# Patient Record
Sex: Male | Born: 1965
Health system: Southern US, Community
[De-identification: ages and names within clinical notes are randomized; demographics above are authoritative.]

## PROBLEM LIST (undated history)

## (undated) DIAGNOSIS — I10 Essential (primary) hypertension: Secondary | ICD-10-CM

## (undated) DIAGNOSIS — E785 Hyperlipidemia, unspecified: Secondary | ICD-10-CM

## (undated) HISTORY — DX: Essential (primary) hypertension: I10

---

## 2003-05-10 ENCOUNTER — Emergency Department (HOSPITAL_COMMUNITY): Admission: AD | Admit: 2003-05-10 | Discharge: 2003-05-10 | Payer: Self-pay | Admitting: Family Medicine

## 2008-09-22 ENCOUNTER — Emergency Department (HOSPITAL_COMMUNITY): Admission: EM | Admit: 2008-09-22 | Discharge: 2008-09-22 | Payer: Self-pay | Admitting: Emergency Medicine

## 2010-10-16 LAB — WOUND CULTURE

## 2012-12-15 ENCOUNTER — Ambulatory Visit: Payer: No Typology Code available for payment source | Attending: Family Medicine | Admitting: Internal Medicine

## 2012-12-15 VITALS — BP 154/102 | HR 63 | Temp 98.0°F | Resp 15 | Ht <= 58 in | Wt 220.0 lb

## 2012-12-15 DIAGNOSIS — I10 Essential (primary) hypertension: Secondary | ICD-10-CM | POA: Insufficient documentation

## 2012-12-15 MED ORDER — METOPROLOL SUCCINATE ER 50 MG PO TB24: 50.0000 mg | ORAL_TABLET | Freq: Every day | ORAL | Status: DC

## 2012-12-15 MED ORDER — HYDROCHLOROTHIAZIDE 25 MG PO TABS: 25.0000 mg | ORAL_TABLET | Freq: Every day | ORAL | Status: DC

## 2012-12-15 MED ORDER — POTASSIUM CHLORIDE CRYS ER 20 MEQ PO TBCR: 20.0000 meq | EXTENDED_RELEASE_TABLET | Freq: Every day | ORAL | Status: DC

## 2012-12-15 MED ORDER — AMLODIPINE BESYLATE 10 MG PO TABS: 10.0000 mg | ORAL_TABLET | Freq: Every day | ORAL | Status: DC

## 2012-12-15 NOTE — Progress Notes (Signed)
Patient here to establish care Takes medication for blood pressure States takes a med called HTC ?

## 2012-12-15 NOTE — Progress Notes (Signed)
Patient ID: Damon Peck, male   DOB: 1966-05-08, 47 y.o.   MRN: [id number]  Patient Demographics  Damon Peck, is a 47 y.o. male  [id number]  [id number]  DOB - [birth year]  Chief Complaint  Patient presents with  . Establish Care        Subjective:   Damon Peck today has, No headache, No chest pain, No abdominal pain - No Nausea, No new weakness tingling or numbness, No Cough - SOB. History of hypertension and is here to get his medications refilled, no subjective complaints.  Objective:   No past medical history on file.    No past surgical history on file.   Filed Vitals:   12/15/12 1138  BP: 154/102  Pulse: 63  Temp: 98 F (36.7 C)  Resp: 15  SpO2: 100%     Exam  Awake Alert, Oriented X 3, No new F.N deficits, Normal affect [place].AT,PERRAL Supple Neck,No JVD, No cervical lymphadenopathy appriciated.  Symmetrical Chest wall movement, Good air movement bilaterally, CTAB RRR,No Gallops,Rubs or new Murmurs, No Parasternal Heave +ve B.Sounds, Abd Soft, Non tender, No organomegaly appriciated, No rebound - guarding or rigidity. No Cyanosis, Clubbing or edema, No new Rash or bruise       Data Review   CBC No results found for this basename: WBC, HGB, HCT, PLT, MCV, MCH, MCHC, RDW, NEUTRABS, LYMPHSABS, MONOABS, EOSABS, BASOSABS, BANDABS, BANDSABD,  in the last 168 hours  Chemistries   No results found for this basename: NA, K, CL, CO2, GLUCOSE, BUN, CREATININE, GFRCGP, CALCIUM, MG, AST, ALT, ALKPHOS, BILITOT,  in the last 168 hours ------------------------------------------------------------------------------------------------------------------ No results found for this basename: HGBA1C,  in the last 72 hours ------------------------------------------------------------------------------------------------------------------ No results found for this basename: CHOL, HDL, LDLCALC, TRIG, CHOLHDL, LDLDIRECT,  in the last 72  hours ------------------------------------------------------------------------------------------------------------------ No results found for this basename: TSH, T4TOTAL, FREET3, T3FREE, THYROIDAB,  in the last 72 hours ------------------------------------------------------------------------------------------------------------------ No results found for this basename: VITAMINB12, FOLATE, FERRITIN, TIBC, IRON, RETICCTPCT,  in the last 72 hours  Coagulation profile  No results found for this basename: INR, PROTIME,  in the last 168 hours     Prior to Admission medications   Medication Sig Start Date End Date Taking? Authorizing Provider  metoprolol succinate (TOPROL-XL) 50 MG 24 hr tablet Take 1 tablet (50 mg total) by mouth daily. Take with or immediately following a meal. 12/15/12  Yes Leroy Sea, MD  hydrochlorothiazide (HYDRODIURIL) 25 MG tablet Take 1 tablet (25 mg total) by mouth daily. 12/15/12   Leroy Sea, MD  potassium chloride SA (K-DUR,KLOR-CON) 20 MEQ tablet Take 1 tablet (20 mEq total) by mouth daily. 12/15/12   Leroy Sea, MD     Assessment & Plan   Hypertension. Stable his home medications were refilled as above, these are not new medications, and her Norvasc for better control, he will come back in a month to get BP and BMP checked.       Leroy Sea M.D on 12/15/2012 at 11:52 AM

## 2013-01-04 ENCOUNTER — Telehealth: Payer: Self-pay | Admitting: Family Medicine

## 2013-01-04 NOTE — Telephone Encounter (Signed)
[facility] calling for pt. Please call [facility] rep back.

## 2013-03-17 ENCOUNTER — Ambulatory Visit: Payer: No Typology Code available for payment source | Attending: Internal Medicine

## 2013-04-24 ENCOUNTER — Ambulatory Visit: Payer: No Typology Code available for payment source | Attending: Internal Medicine | Admitting: Internal Medicine

## 2013-04-24 ENCOUNTER — Encounter: Payer: Self-pay | Admitting: Internal Medicine

## 2013-04-24 VITALS — BP 159/92 | HR 62 | Temp 98.7°F | Resp 16 | Ht 69.0 in | Wt 240.0 lb

## 2013-04-24 DIAGNOSIS — I10 Essential (primary) hypertension: Secondary | ICD-10-CM | POA: Insufficient documentation

## 2013-04-24 DIAGNOSIS — Z79899 Other long term (current) drug therapy: Secondary | ICD-10-CM | POA: Insufficient documentation

## 2013-04-24 DIAGNOSIS — E785 Hyperlipidemia, unspecified: Secondary | ICD-10-CM | POA: Insufficient documentation

## 2013-04-24 DIAGNOSIS — Z760 Encounter for issue of repeat prescription: Secondary | ICD-10-CM | POA: Insufficient documentation

## 2013-04-24 MED ORDER — METOPROLOL SUCCINATE ER 50 MG PO TB24: 50.0000 mg | ORAL_TABLET | Freq: Every day | ORAL | Status: DC

## 2013-04-24 MED ORDER — POTASSIUM CHLORIDE CRYS ER 20 MEQ PO TBCR: 20.0000 meq | EXTENDED_RELEASE_TABLET | Freq: Every day | ORAL | Status: DC

## 2013-04-24 MED ORDER — HYDROCHLOROTHIAZIDE 25 MG PO TABS: 25.0000 mg | ORAL_TABLET | Freq: Every day | ORAL | Status: DC

## 2013-04-24 MED ORDER — AMLODIPINE BESYLATE 10 MG PO TABS: 10.0000 mg | ORAL_TABLET | Freq: Every day | ORAL | Status: DC

## 2013-04-24 NOTE — Progress Notes (Signed)
Pt is here for a f/u visit. Pt is here for a check up and  medication refill.

## 2013-04-24 NOTE — Progress Notes (Signed)
Patient ID: Damon Peck, male   DOB: 03-20-1966, 47 y.o.   MRN: [id number] Patient Demographics  Damon Peck, is a 47 y.o. male  [id number]  [id number]  DOB - Jan 03, 1966  Chief Complaint  Patient presents with  . Follow-up        Subjective:   Damon Peck is a 47 y.o. male here today for a follow up visit. He is here for refill of his medications. He has no significant complaint today. He claims to be compliant with medication, he thinks his blood pressure was high today because of stress, his wife was rushed to the emergency department this morning for shortness of breath/difficulty breathing. He does not smoke cigarette. Patient has No headache, No chest pain, No abdominal pain - No Nausea, No new weakness tingling or numbness, No Cough - SOB.  ALLERGIES: No Known Allergies  PAST MEDICAL HISTORY: Past Medical History  Diagnosis Date  . Hypertension     MEDICATIONS AT HOME: Prior to Admission medications   Medication Sig Start Date End Date Taking? Authorizing Provider  hydrochlorothiazide (HYDRODIURIL) 25 MG tablet Take 1 tablet (25 mg total) by mouth daily. 04/24/13  Yes Jeanann Lewandowsky, MD  metoprolol succinate (TOPROL-XL) 50 MG 24 hr tablet Take 1 tablet (50 mg total) by mouth daily. Take with or immediately following a meal. 04/24/13  Yes Jeanann Lewandowsky, MD  potassium chloride SA (K-DUR,KLOR-CON) 20 MEQ tablet Take 1 tablet (20 mEq total) by mouth daily. 04/24/13  Yes Jeanann Lewandowsky, MD  amLODipine (NORVASC) 10 MG tablet Take 1 tablet (10 mg total) by mouth daily. 04/24/13   Jeanann Lewandowsky, MD     Objective:   Filed Vitals:   04/24/13 1210  BP: 159/92  Pulse: 62  Temp: 98.7 F (37.1 C)  TempSrc: Oral  Resp: 16  Height: 5\' 9"  (1.753 m)  Weight: 240 lb (108.863 kg)  SpO2: 98%    Exam General appearance : Awake, alert, not in any distress. Speech Clear. Not toxic looking HEENT: Atraumatic and Normocephalic, pupils equally reactive to light and  accomodation Neck: supple, no JVD. No cervical lymphadenopathy.  Chest:Good air entry bilaterally, no added sounds  CVS: S1 S2 regular, no murmurs.  Abdomen: Bowel sounds present, Non tender and not distended with no gaurding, rigidity or rebound. Extremities: B/L Lower Ext shows no edema, both legs are warm to touch Neurology: Awake alert, and oriented X 3, CN II-XII intact, Non focal Skin:No Rash Wounds:N/A   Data Review   CBC No results found for this basename: WBC, HGB, HCT, PLT, MCV, MCH, MCHC, RDW, NEUTRABS, LYMPHSABS, MONOABS, EOSABS, BASOSABS, BANDABS, BANDSABD,  in the last 168 hours  Chemistries   No results found for this basename: NA, K, CL, CO2, GLUCOSE, BUN, CREATININE, GFRCGP, CALCIUM, MG, AST, ALT, ALKPHOS, BILITOT,  in the last 168 hours ------------------------------------------------------------------------------------------------------------------ No results found for this basename: HGBA1C,  in the last 72 hours ------------------------------------------------------------------------------------------------------------------ No results found for this basename: CHOL, HDL, LDLCALC, TRIG, CHOLHDL, LDLDIRECT,  in the last 72 hours ------------------------------------------------------------------------------------------------------------------ No results found for this basename: TSH, T4TOTAL, FREET3, T3FREE, THYROIDAB,  in the last 72 hours ------------------------------------------------------------------------------------------------------------------ No results found for this basename: VITAMINB12, FOLATE, FERRITIN, TIBC, IRON, RETICCTPCT,  in the last 72 hours  Coagulation profile  No results found for this basename: INR, PROTIME,  in the last 168 hours    Assessment & Plan   Patient Active Problem List   Diagnosis Date Noted  . Dyslipidemia 04/24/2013  . HTN (hypertension) 12/15/2012  Plan: Hydrochlorothiazide 25 mg tablet by mouth daily Metoprolol  XL 50 mg tablet by mouth daily Amlodipine 10 mg tablet by mouth daily Potassium chloride 20 mEq tablet by mouth daily  Labs today: Comprehensive metabolic panel Lipid panel  Will review results and decide on continuation of potassium or not We'll call patient with results when available  Patient has been counseled extensively about nutrition and exercise Resources for DASH diet given Patient encouraged to be compliant with medications, we have discussed hypertension and Blood pressure goal.  Health Maintenance -Vaccinations:  -Influenza decline the patient  Follow up in 3 months or when necessary   The patient was given clear instructions to go to ER or return to medical center if symptoms don't improve, worsen or new problems develop. The patient verbalized understanding. The patient was told to call to get lab results if they haven't heard anything in the next week.    Jeanann Lewandowsky, MD, MHA, FACP, FAAP [facility] [place], [place] [phone]   04/24/2013, 12:45 PM

## 2013-04-24 NOTE — Patient Instructions (Signed)

## 2013-04-25 LAB — CMP AND LIVER
ALT: 24 U/L (ref 0–53)
AST: 18 U/L (ref 0–37)
Albumin: 4.2 g/dL (ref 3.5–5.2)
Bilirubin, Direct: 0.1 mg/dL (ref 0.0–0.3)
CO2: 29 mEq/L (ref 19–32)
Calcium: 9.3 mg/dL (ref 8.4–10.5)
Chloride: 99 mEq/L (ref 96–112)
Potassium: 4.1 mEq/L (ref 3.5–5.3)
Total Protein: 7 g/dL (ref 6.0–8.3)

## 2013-04-25 LAB — LIPID PANEL
LDL Cholesterol: 109 mg/dL — ABNORMAL HIGH (ref 0–99)
VLDL: 29 mg/dL (ref 0–40)

## 2013-04-28 ENCOUNTER — Telehealth: Payer: Self-pay

## 2013-04-28 NOTE — Telephone Encounter (Signed)
Message copied by Damon Peck on Fri Apr 28, 2013  4:40 PM ------      Message from: Quentin Angst      Created: Fri Apr 28, 2013  4:33 PM       Please inform patient that his lab results are mostly normal. Recommend nutrition and regular exercise regimen ------

## 2013-04-28 NOTE — Telephone Encounter (Signed)
Patient is aware of his lab results 

## 2013-05-26 ENCOUNTER — Other Ambulatory Visit: Payer: Self-pay | Admitting: Emergency Medicine

## 2013-05-26 DIAGNOSIS — I10 Essential (primary) hypertension: Secondary | ICD-10-CM

## 2013-05-26 DIAGNOSIS — E785 Hyperlipidemia, unspecified: Secondary | ICD-10-CM

## 2013-07-10 ENCOUNTER — Telehealth: Payer: Self-pay | Admitting: Emergency Medicine

## 2013-07-10 NOTE — Telephone Encounter (Signed)
Left voicemail for pt to return call when message received. Upon looking in doctor notes, medication Metoprolol succinate XL 50 mg was never increased to 100 mg.

## 2013-07-25 ENCOUNTER — Ambulatory Visit: Payer: Self-pay | Admitting: Internal Medicine

## 2013-08-02 ENCOUNTER — Ambulatory Visit: Payer: No Typology Code available for payment source | Attending: Internal Medicine

## 2013-08-03 ENCOUNTER — Encounter: Payer: Self-pay | Admitting: Internal Medicine

## 2013-08-03 ENCOUNTER — Ambulatory Visit: Payer: No Typology Code available for payment source | Attending: Internal Medicine | Admitting: Internal Medicine

## 2013-08-03 VITALS — BP 134/89 | HR 77 | Temp 98.3°F | Resp 16 | Ht 71.0 in | Wt 245.0 lb

## 2013-08-03 DIAGNOSIS — E785 Hyperlipidemia, unspecified: Secondary | ICD-10-CM

## 2013-08-03 DIAGNOSIS — M722 Plantar fascial fibromatosis: Secondary | ICD-10-CM | POA: Insufficient documentation

## 2013-08-03 DIAGNOSIS — I10 Essential (primary) hypertension: Secondary | ICD-10-CM

## 2013-08-03 MED ORDER — METOPROLOL SUCCINATE ER 50 MG PO TB24: 50.0000 mg | ORAL_TABLET | Freq: Every day | ORAL | Status: DC

## 2013-08-03 MED ORDER — NITROGLYCERIN 2 % TD OINT: 0.5000 [in_us] | TOPICAL_OINTMENT | Freq: Four times a day (QID) | TRANSDERMAL | Status: DC

## 2013-08-03 MED ORDER — POTASSIUM CHLORIDE CRYS ER 20 MEQ PO TBCR: 20.0000 meq | EXTENDED_RELEASE_TABLET | Freq: Every day | ORAL | Status: DC

## 2013-08-03 MED ORDER — TRAMADOL HCL 50 MG PO TABS: 50.0000 mg | ORAL_TABLET | Freq: Three times a day (TID) | ORAL | Status: DC | PRN

## 2013-08-03 MED ORDER — AMLODIPINE BESYLATE 10 MG PO TABS: 10.0000 mg | ORAL_TABLET | Freq: Every day | ORAL | Status: DC

## 2013-08-03 MED ORDER — HYDROCHLOROTHIAZIDE 25 MG PO TABS: 25.0000 mg | ORAL_TABLET | Freq: Every day | ORAL | Status: DC

## 2013-08-03 NOTE — Progress Notes (Signed)
Patient ID: Damon Peck, male   DOB: [birth year], 48 y.o.   MRN: [id number]   CC: Needs refill on medicines  HPI: Patient has 48 year old male who presents to clinic requiring refills on medications. He reports several months duration of right heel pain. He reports going to gym regularly and feels as if he pulled a tendon. He is unable to bear weight at certain times which is worse in the morning at nighttime. He is still able to ambulate but it's painful. He describes pain as sharp and intermittent, 7/10 in severity when present, worse with ambulation and somewhat improved with exercise. He denies fevers and chills, no specific focal neurological symptoms, no numbness or tingling  No Known Allergies Past Medical History  Diagnosis Date  . Hypertension    No current outpatient prescriptions on file prior to visit.   No current facility-administered medications on file prior to visit.   History reviewed. No pertinent family history. History   Social History  . Marital Status: Married    Spouse Name: N/A    Number of Children: N/A  . Years of Education: N/A   Occupational History  . Not on file.   Social History Main Topics  . Smoking status: Never Smoker   . Smokeless tobacco: Not on file  . Alcohol Use: Not on file  . Drug Use: Not on file  . Sexual Activity: Not on file   Other Topics Concern  . Not on file   Social History Narrative  . No narrative on file    Review of Systems  Constitutional: Negative for fever, chills, diaphoresis, activity change, appetite change and fatigue.  HENT: Negative for ear pain, nosebleeds, congestion, facial swelling, rhinorrhea, neck pain, neck stiffness and ear discharge.   Eyes: Negative for pain, discharge, redness, itching and visual disturbance.  Respiratory: Negative for cough, choking, chest tightness, shortness of breath, wheezing and stridor.   Cardiovascular: Negative for chest pain, palpitations and leg swelling.   Gastrointestinal: Negative for abdominal distention.  Genitourinary: Negative for dysuria, urgency, frequency, hematuria, flank pain, decreased urine volume, difficulty urinating and dyspareunia.  Musculoskeletal: Negative for back pain, joint swelling, arthralgias and gait problem.  Neurological: Negative for dizziness, tremors, seizures, syncope, facial asymmetry, speech difficulty, weakness, light-headedness, numbness and headaches.  Hematological: Negative for adenopathy. Does not bruise/bleed easily.  Psychiatric/Behavioral: Negative for hallucinations, behavioral problems, confusion, dysphoric mood, decreased concentration and agitation.    Objective:   Filed Vitals:   08/03/13 1421  BP: 134/89  Pulse: 77  Temp: 98.3 F (36.8 C)  Resp: 16    Physical Exam  Constitutional: Appears well-developed and well-nourished. No distress.  CVS: RRR, S1/S2 +, no murmurs, no gallops, no carotid bruit.  Pulmonary: Effort and breath sounds normal, no stridor, rhonchi, wheezes, rales.  Abdominal: Soft. BS +,  no distension, tenderness, rebound or guarding.  Musculoskeletal: Normal range of motion. Significant tenderness at the right Achilles tendon and extending across the right plantar fascia  Lymphadenopathy: No lymphadenopathy noted, cervical, inguinal.   No results found for this basename: WBC, HGB, HCT, MCV, PLT   Lab Results  Component Value Date   CREATININE 0.64 04/24/2013   BUN 13 04/24/2013   NA 139 04/24/2013   K 4.1 04/24/2013   CL 99 04/24/2013   CO2 29 04/24/2013    No results found for this basename: HGBA1C   Lipid Panel     Component Value Date/Time   CHOL 172 04/24/2013 1248   TRIG 143 04/24/2013 1248  HDL 34* 04/24/2013 1248   CHOLHDL 5.1 04/24/2013 1248   VLDL 29 04/24/2013 1248   LDLCALC 109* 04/24/2013 1248       Assessment and plan:   Patient Active Problem List   Diagnosis Date Noted  . HTN (hypertension) - stable and well controlled, we'll  provide refills on current medications. Patient educated on continue to check blood pressure regularly and to call us back if the numbers are persistently higher than 140/90.  12/15/2012       Plantar fasciitis - we have discussed importance of stretching and exercise, exercises demonstrated to the patient in the office. We'll also prescribe tramadol and nitro paste. We have discussed importance of a good proper shoe support, avoiding flat shoes.

## 2013-08-03 NOTE — Patient Instructions (Signed)

## 2013-08-03 NOTE — Progress Notes (Signed)
Pt here f/u HTN with medication Taking prescribed meds daily States he is diet/execising with home bp monitoring BP 134/89 Need refills

## 2013-10-02 ENCOUNTER — Ambulatory Visit: Payer: Self-pay | Admitting: Internal Medicine

## 2014-02-16 ENCOUNTER — Other Ambulatory Visit: Payer: Self-pay | Admitting: Internal Medicine

## 2014-02-19 ENCOUNTER — Other Ambulatory Visit: Payer: Self-pay | Admitting: Internal Medicine

## 2014-03-07 ENCOUNTER — Other Ambulatory Visit: Payer: Self-pay | Admitting: Internal Medicine

## 2014-03-08 ENCOUNTER — Ambulatory Visit: Payer: No Typology Code available for payment source | Attending: Internal Medicine | Admitting: Internal Medicine

## 2014-03-08 ENCOUNTER — Encounter: Payer: Self-pay | Admitting: Internal Medicine

## 2014-03-08 VITALS — BP 150/87 | HR 57 | Temp 98.0°F | Resp 20 | Ht 69.0 in | Wt 245.2 lb

## 2014-03-08 DIAGNOSIS — E785 Hyperlipidemia, unspecified: Secondary | ICD-10-CM | POA: Diagnosis not present

## 2014-03-08 DIAGNOSIS — I10 Essential (primary) hypertension: Secondary | ICD-10-CM | POA: Insufficient documentation

## 2014-03-08 LAB — COMPLETE METABOLIC PANEL WITH GFR
ALK PHOS: 54 U/L (ref 39–117)
ALT: 25 U/L (ref 0–53)
AST: 17 U/L (ref 0–37)
Albumin: 4.4 g/dL (ref 3.5–5.2)
BILIRUBIN TOTAL: 0.3 mg/dL (ref 0.2–1.2)
BUN: 11 mg/dL (ref 6–23)
CO2: 30 mEq/L (ref 19–32)
Calcium: 9.2 mg/dL (ref 8.4–10.5)
Chloride: 97 mEq/L (ref 96–112)
Creat: 0.68 mg/dL (ref 0.50–1.35)
GFR, Est African American: >89 mL/min
GLUCOSE: 89 mg/dL (ref 70–99)
Potassium: 4.3 mEq/L (ref 3.5–5.3)
Sodium: 138 mEq/L (ref 135–145)
Total Protein: 7.3 g/dL (ref 6.0–8.3)

## 2014-03-08 LAB — LIPID PANEL
Cholesterol: 207 mg/dL — ABNORMAL HIGH (ref 0–200)
HDL: 35 mg/dL — AB (ref >39)
LDL Cholesterol: 114 mg/dL — ABNORMAL HIGH (ref 0–99)
TRIGLYCERIDES: 292 mg/dL — AB (ref <150)
Total CHOL/HDL Ratio: 5.9 Ratio
VLDL: 58 mg/dL — ABNORMAL HIGH (ref 0–40)

## 2014-03-08 LAB — TSH: TSH: 1.12 u[IU]/mL (ref 0.350–4.500)

## 2014-03-08 LAB — POCT GLYCOSYLATED HEMOGLOBIN (HGB A1C): HEMOGLOBIN A1C: 5.5

## 2014-03-08 MED ORDER — METOPROLOL SUCCINATE ER 100 MG PO TB24: 100.0000 mg | ORAL_TABLET | Freq: Every day | ORAL | Status: DC

## 2014-03-08 MED ORDER — HYDROCHLOROTHIAZIDE 25 MG PO TABS: 25.0000 mg | ORAL_TABLET | Freq: Every day | ORAL | Status: DC

## 2014-03-08 MED ORDER — POTASSIUM CHLORIDE CRYS ER 20 MEQ PO TBCR: 20.0000 meq | EXTENDED_RELEASE_TABLET | Freq: Every day | ORAL | Status: DC

## 2014-03-08 MED ORDER — AMLODIPINE BESYLATE 10 MG PO TABS: 10.0000 mg | ORAL_TABLET | Freq: Every day | ORAL | Status: DC

## 2014-03-08 MED ORDER — TRAMADOL HCL 50 MG PO TABS: 50.0000 mg | ORAL_TABLET | Freq: Two times a day (BID) | ORAL | Status: DC | PRN

## 2014-03-08 NOTE — Patient Instructions (Signed)
DASH Eating Plan DASH stands for "Dietary Approaches to Stop Hypertension." The DASH eating plan is a healthy eating plan that has been shown to reduce high blood pressure (hypertension). Additional health benefits may include reducing the risk of type 2 diabetes mellitus, heart disease, and stroke. The DASH eating plan may also help with weight loss. WHAT DO I NEED TO KNOW ABOUT THE DASH EATING PLAN? For the DASH eating plan, you will follow these general guidelines:  Choose foods with a percent daily value for sodium of less than 5% (as listed on the food label).  Use salt-free seasonings or herbs instead of table salt or sea salt.  Check with your health care provider or pharmacist before using salt substitutes.  Eat lower-sodium products, often labeled as "lower sodium" or "no salt added."  Eat fresh foods.  Eat more vegetables, fruits, and low-fat dairy products.  Choose whole grains. Look for the word "whole" as the first word in the ingredient list.  Choose fish and skinless chicken or turkey more often than red meat. Limit fish, poultry, and meat to 6 oz (170 g) each day.  Limit sweets, desserts, sugars, and sugary drinks.  Choose heart-healthy fats.  Limit cheese to 1 oz (28 g) per day.  Eat more home-cooked food and less restaurant, buffet, and fast food.  Limit fried foods.  Cook foods using methods other than frying.  Limit canned vegetables. If you do use them, rinse them well to decrease the sodium.  When eating at a restaurant, ask that your food be prepared with less salt, or no salt if possible. WHAT FOODS CAN I EAT? Seek help from a dietitian for individual calorie needs. Grains Whole grain or whole wheat bread. Brown rice. Whole grain or whole wheat pasta. Quinoa, bulgur, and whole grain cereals. Low-sodium cereals. Corn or whole wheat flour tortillas. Whole grain cornbread. Whole grain crackers. Low-sodium crackers. Vegetables Fresh or frozen vegetables  (raw, steamed, roasted, or grilled). Low-sodium or reduced-sodium tomato and vegetable juices. Low-sodium or reduced-sodium tomato sauce and paste. Low-sodium or reduced-sodium canned vegetables.  Fruits All fresh, canned (in natural juice), or frozen fruits. Meat and Other Protein Products Ground beef (85% or leaner), grass-fed beef, or beef trimmed of fat. Skinless chicken or turkey. Ground chicken or turkey. Pork trimmed of fat. All fish and seafood. Eggs. Dried beans, peas, or lentils. Unsalted nuts and seeds. Unsalted canned beans. Dairy Low-fat dairy products, such as skim or 1% milk, 2% or reduced-fat cheeses, low-fat ricotta or cottage cheese, or plain low-fat yogurt. Low-sodium or reduced-sodium cheeses. Fats and Oils Tub margarines without trans fats. Light or reduced-fat mayonnaise and salad dressings (reduced sodium). Avocado. Safflower, olive, or canola oils. Natural peanut or almond butter. Other Unsalted popcorn and pretzels. The items listed above may not be a complete list of recommended foods or beverages. Contact your dietitian for more options. WHAT FOODS ARE NOT RECOMMENDED? Grains White bread. White pasta. White rice. Refined cornbread. Bagels and croissants. Crackers that contain trans fat. Vegetables Creamed or fried vegetables. Vegetables in a cheese sauce. Regular canned vegetables. Regular canned tomato sauce and paste. Regular tomato and vegetable juices. Fruits Dried fruits. Canned fruit in light or heavy syrup. Fruit juice. Meat and Other Protein Products Fatty cuts of meat. Ribs, chicken wings, bacon, sausage, bologna, salami, chitterlings, fatback, hot dogs, bratwurst, and packaged luncheon meats. Salted nuts and seeds. Canned beans with salt. Dairy Whole or 2% milk, cream, half-and-half, and cream cheese. Whole-fat or sweetened yogurt. Full-fat   cheeses or blue cheese. Nondairy creamers and whipped toppings. Processed cheese, cheese spreads, or cheese  curds. Condiments Onion and garlic salt, seasoned salt, table salt, and sea salt. Canned and packaged gravies. Worcestershire sauce. Tartar sauce. Barbecue sauce. Teriyaki sauce. Soy sauce, including reduced sodium. Steak sauce. Fish sauce. Oyster sauce. Cocktail sauce. Horseradish. Ketchup and mustard. Meat flavorings and tenderizers. Bouillon cubes. Hot sauce. Tabasco sauce. Marinades. Taco seasonings. Relishes. Fats and Oils Butter, stick margarine, lard, shortening, ghee, and bacon fat. Coconut, palm kernel, or palm oils. Regular salad dressings. Other Pickles and olives. Salted popcorn and pretzels. The items listed above may not be a complete list of foods and beverages to avoid. Contact your dietitian for more information. WHERE CAN I FIND MORE INFORMATION? National Heart, Lung, and Blood Institute: www.nhlbi.nih.gov/health/health-topics/topics/dash/ Document Released: 06/11/2011 Document Revised: 11/06/2013 Document Reviewed: 04/26/2013 ExitCare Patient Information 2015 [employer]. This information is not intended to replace advice given to you by your health care provider. Make sure you discuss any questions you have with your health care provider. Hypertension Hypertension, commonly called high blood pressure, is when the force of blood pumping through your arteries is too strong. Your arteries are the blood vessels that carry blood from your heart throughout your body. A blood pressure reading consists of a higher number over a lower number, such as 110/72. The higher number (systolic) is the pressure inside your arteries when your heart pumps. The lower number (diastolic) is the pressure inside your arteries when your heart relaxes. Ideally you want your blood pressure below 120/80. Hypertension forces your heart to work harder to pump blood. Your arteries may become narrow or stiff. Having hypertension puts you at risk for heart disease, stroke, and other problems.  RISK  FACTORS Some risk factors for high blood pressure are controllable. Others are not.  Risk factors you cannot control include:   Race. You may be at higher risk if you are African American.  Age. Risk increases with age.  Gender. Men are at higher risk than women before age 45 years. After age 65, women are at higher risk than men. Risk factors you can control include:  Not getting enough exercise or physical activity.  Being overweight.  Getting too much fat, sugar, calories, or salt in your diet.  Drinking too much alcohol. SIGNS AND SYMPTOMS Hypertension does not usually cause signs or symptoms. Extremely high blood pressure (hypertensive crisis) may cause headache, anxiety, shortness of breath, and nosebleed. DIAGNOSIS  To check if you have hypertension, your health care provider will measure your blood pressure while you are seated, with your arm held at the level of your heart. It should be measured at least twice using the same arm. Certain conditions can cause a difference in blood pressure between your right and left arms. A blood pressure reading that is higher than normal on one occasion does not mean that you need treatment. If one blood pressure reading is high, ask your health care provider about having it checked again. TREATMENT  Treating high blood pressure includes making lifestyle changes and possibly taking medicine. Living a healthy lifestyle can help lower high blood pressure. You may need to change some of your habits. Lifestyle changes may include:  Following the DASH diet. This diet is high in fruits, vegetables, and whole grains. It is low in salt, red meat, and added sugars.  Getting at least 2 hours of brisk physical activity every week.  Losing weight if necessary.  Not smoking.  Limiting   alcoholic beverages.  Learning ways to reduce stress. If lifestyle changes are not enough to get your blood pressure under control, your health care provider may  prescribe medicine. You may need to take more than one. Work closely with your health care provider to understand the risks and benefits. HOME CARE INSTRUCTIONS  Have your blood pressure rechecked as directed by your health care provider.   Take medicines only as directed by your health care provider. Follow the directions carefully. Blood pressure medicines must be taken as prescribed. The medicine does not work as well when you skip doses. Skipping doses also puts you at risk for problems.   Do not smoke.   Monitor your blood pressure at home as directed by your health care provider. SEEK MEDICAL CARE IF:   You think you are having a reaction to medicines taken.  You have recurrent headaches or feel dizzy.  You have swelling in your ankles.  You have trouble with your vision. SEEK IMMEDIATE MEDICAL CARE IF:  You develop a severe headache or confusion.  You have unusual weakness, numbness, or feel faint.  You have severe chest or abdominal pain.  You vomit repeatedly.  You have trouble breathing. MAKE SURE YOU:   Understand these instructions.  Will watch your condition.  Will get help right away if you are not doing well or get worse. Document Released: 06/22/2005 Document Revised: 11/06/2013 Document Reviewed: 04/14/2013 ExitCare Patient Information 2015 [employer]. This information is not intended to replace advice given to you by your health care provider. Make sure you discuss any questions you have with your health care provider.  

## 2014-03-08 NOTE — Progress Notes (Signed)
Patient presents for f/u ion HTN States he ran out of HCTZ and potassium 1 week ago

## 2014-03-08 NOTE — Progress Notes (Signed)
Patient ID: Damon Peck, male   DOB: 1965-11-03, 48 y.o.   MRN: [id number]   Damon Peck, is a 48 y.o. male  [id number]  NFA:[id number]  DOB - [birth year]  Chief Complaint  Patient presents with  . Follow-up  . Hypertension        Subjective:   Damon Peck is a 48 y.o. male here today for a follow up visit. Patient has history of hypertension, here today for routine follow-up. He also requests refill of all his medications. Patient ran out of hydrochlorothiazide and potassium over a week ago. Patient claims compliant when he has medications, reports no side effects. Patient has No headache, No chest pain, No abdominal pain - No Nausea, No new weakness tingling or numbness, No Cough - SOB.  Problem  Essential Hypertension    ALLERGIES: No Known Allergies  PAST MEDICAL HISTORY: Past Medical History  Diagnosis Date  . Hypertension     MEDICATIONS AT HOME: Prior to Admission medications   Medication Sig Start Date End Date Taking? Authorizing Provider  amLODipine (NORVASC) 10 MG tablet Take 1 tablet (10 mg total) by mouth daily. 03/08/14  Yes Quentin Angst, MD  metoprolol succinate (TOPROL-XL) 100 MG 24 hr tablet Take 1 tablet (100 mg total) by mouth daily. Take with or immediately following a meal. 03/08/14  Yes Sherrill Mckamie E Hyman Hopes, MD  nitroGLYCERIN (NITROGLYN) 2 % ointment Apply 0.5 inches topically 4 (four) times daily. 08/03/13  Yes Dorothea Ogle, MD  hydrochlorothiazide (HYDRODIURIL) 25 MG tablet Take 1 tablet (25 mg total) by mouth daily. 03/08/14   Quentin Angst, MD  potassium chloride SA (K-DUR,KLOR-CON) 20 MEQ tablet Take 1 tablet (20 mEq total) by mouth daily. 03/08/14   Quentin Angst, MD  traMADol (ULTRAM) 50 MG tablet Take 1 tablet (50 mg total) by mouth every 12 (twelve) hours as needed. 03/08/14   Quentin Angst, MD     Objective:   Filed Vitals:   03/08/14 1230  BP: 150/87  Pulse: 57  Temp: 98 F (36.7 C)  TempSrc: Oral  Resp: 20  Height:   (1.753 m)  Weight: 245 lb 3.2 oz (111.222 kg)  SpO2: 97%    Exam General appearance : Awake, alert, not in any distress. Speech Clear. Not toxic looking HEENT: Atraumatic and Normocephalic, pupils equally reactive to light and accomodation Neck: supple, no JVD. No cervical lymphadenopathy.  Chest:Good air entry bilaterally, no added sounds  CVS: S1 S2 regular, no murmurs.  Abdomen: Bowel sounds present, Non tender and not distended with no gaurding, rigidity or rebound. Extremities: B/L Lower Ext shows no edema, both legs are warm to touch Neurology: Awake alert, and oriented X 3, CN II-XII intact, Non focal Skin:No Rash Wounds:N/A  Data Review No results found for this basename: HGBA1C     Assessment & Plan   1. Essential hypertension  - COMPLETE METABOLIC PANEL WITH GFR - POCT glycosylated hemoglobin (Hb A1C) - Lipid panel - TSH   - hydrochlorothiazide (HYDRODIURIL) 25 MG tablet; Take 1 tablet (25 mg total) by mouth daily.  Dispense: 90 tablet; Refill: 3 - potassium chloride SA (K-DUR,KLOR-CON) 20 MEQ tablet; Take 1 tablet (20 mEq total) by mouth daily.  Dispense: 90 tablet; Refill: 3 - metoprolol succinate (TOPROL-XL) 100 MG 24 hr tablet; Take 1 tablet (100 mg total) by mouth daily. Take with or immediately following a meal.  Dispense: 90 tablet; Refill: 3 - amLODipine (NORVASC) 10 MG tablet; Take 1 tablet (10 mg total)  by mouth daily.  Dispense: 90 tablet; Refill: 3  2. Dyslipidemia  -Lipid panel  Patient was counseled extensively about nutrition and exercise.  Return in about 6 months (around 09/06/2014), or if symptoms worsen or fail to improve, for Follow up HTN.  The patient was given clear instructions to go to ER or return to medical center if symptoms don't improve, worsen or new problems develop. The patient verbalized understanding. The patient was told to call to get lab results if they haven't heard anything in the next week.   This note has been created  with Education officer, environmental. Any transcriptional errors are unintentional.    Jeanann Lewandowsky, MD, MHA, FACP, FAAP [facility] [place], [place] [phone]   03/08/2014, 12:42 PM

## 2014-03-16 ENCOUNTER — Telehealth: Payer: Self-pay | Admitting: Emergency Medicine

## 2014-03-16 MED ORDER — SIMVASTATIN 10 MG PO TABS: 10.0000 mg | ORAL_TABLET | Freq: Every day | ORAL | Status: DC

## 2014-03-16 NOTE — Telephone Encounter (Signed)
Pt given lab results with medication instructions to start taking prescribed Simvastatin 10 mg tablet daily and control diet/exercise control Medication Simvastatin 10 mg tab e-scribed to [facility] pharmacy

## 2014-03-16 NOTE — Telephone Encounter (Signed)
Message copied by Darlis Loan on Fri Mar 16, 2014  5:53 PM ------      Message from: Quentin Angst      Created: Tue Mar 13, 2014  5:48 PM       Please inform patient that his laboratory tests are mostly within normal limit except for the cholesterol level. Will start him on low-dose cholesterol medicine, encourage him to continue with dietary control including low fat, low cholesterol diet, advise regular physical exercise at least 3 times a week, 30 minutes each time            Please call in prescription simvastatin 10 mg tablet by mouth daily, 90 tablets with 3 refills ------

## 2014-06-19 ENCOUNTER — Other Ambulatory Visit: Payer: Self-pay | Admitting: Internal Medicine

## 2014-07-02 ENCOUNTER — Telehealth: Payer: Self-pay | Admitting: Internal Medicine

## 2014-07-02 NOTE — Telephone Encounter (Signed)
Patient has come into the clinic today to request a medication refill for Tramadol; please f/u with patient about this request

## 2014-07-10 NOTE — Telephone Encounter (Signed)
Pt requesting Rx Tramadol

## 2014-07-25 ENCOUNTER — Ambulatory Visit: Payer: No Typology Code available for payment source

## 2014-08-09 ENCOUNTER — Ambulatory Visit: Payer: 59 | Attending: Internal Medicine | Admitting: Internal Medicine

## 2014-08-09 ENCOUNTER — Encounter: Payer: Self-pay | Admitting: Internal Medicine

## 2014-08-09 VITALS — BP 137/81 | HR 62 | Temp 98.4°F | Resp 16 | Ht 68.0 in | Wt 243.0 lb

## 2014-08-09 DIAGNOSIS — M545 Low back pain, unspecified: Secondary | ICD-10-CM | POA: Insufficient documentation

## 2014-08-09 DIAGNOSIS — Z1211 Encounter for screening for malignant neoplasm of colon: Secondary | ICD-10-CM | POA: Diagnosis not present

## 2014-08-09 DIAGNOSIS — I10 Essential (primary) hypertension: Secondary | ICD-10-CM | POA: Diagnosis present

## 2014-08-09 DIAGNOSIS — E785 Hyperlipidemia, unspecified: Secondary | ICD-10-CM | POA: Diagnosis not present

## 2014-08-09 DIAGNOSIS — G8929 Other chronic pain: Secondary | ICD-10-CM | POA: Diagnosis not present

## 2014-08-09 DIAGNOSIS — Z23 Encounter for immunization: Secondary | ICD-10-CM

## 2014-08-09 MED ORDER — METOPROLOL SUCCINATE ER 100 MG PO TB24: 100.0000 mg | ORAL_TABLET | Freq: Every day | ORAL | Status: DC

## 2014-08-09 MED ORDER — HYDROCHLOROTHIAZIDE 25 MG PO TABS: 25.0000 mg | ORAL_TABLET | Freq: Every day | ORAL | Status: DC

## 2014-08-09 MED ORDER — SIMVASTATIN 10 MG PO TABS: 10.0000 mg | ORAL_TABLET | Freq: Every day | ORAL | Status: DC

## 2014-08-09 MED ORDER — TRAMADOL HCL 50 MG PO TABS: 50.0000 mg | ORAL_TABLET | Freq: Two times a day (BID) | ORAL | Status: DC | PRN

## 2014-08-09 MED ORDER — AMLODIPINE BESYLATE 10 MG PO TABS: 10.0000 mg | ORAL_TABLET | Freq: Every day | ORAL | Status: DC

## 2014-08-09 NOTE — Progress Notes (Signed)
Patient ID: Damon Peck, male   DOB: 01/03/1966, 49 y.o.   MRN: [id number]   Damon Peck, is a 49 y.o. male  [id number]  NFA:[id number]  DOB - 11/26/1965  Chief Complaint  Patient presents with  . Follow-up  . Hypertension  . Medication Refill        Subjective:   Damon Peck is a [age] y.o. male here today for a follow up visit. Patient has history of hypertension, dyslipidemia and chronic low back pain, here today for routine follow-up. He has no new complaint. He needs refill on all his medications. He is requesting tramadol for low back pain because nothing over-the-counter works. He reports no side effects to medications, compliant with medications, blood pressure is controlled. He does not smoke cigarettes, he does not drink alcohol. He has not had colonoscopy. Patient has No headache, No chest pain, No abdominal pain - No Nausea, No new weakness tingling or numbness, No Cough - SOB.  Problem  Midline Low Back Pain Without Sciatica    ALLERGIES: No Known Allergies  PAST MEDICAL HISTORY: Past Medical History  Diagnosis Date  . Hypertension     MEDICATIONS AT HOME: Prior to Admission medications   Medication Sig Start Date End Date Taking? Authorizing Provider  amLODipine (NORVASC) 10 MG tablet Take 1 tablet (10 mg total) by mouth daily. 08/09/14  Yes Quentin Angstlugbemiga E Richie Vadala, MD  hydrochlorothiazide (HYDRODIURIL) 25 MG tablet Take 1 tablet (25 mg total) by mouth daily. 08/09/14  Yes Quentin Angstlugbemiga E Kaydyn Chism, MD  metoprolol succinate (TOPROL-XL) 100 MG 24 hr tablet Take 1 tablet (100 mg total) by mouth daily. Take with or immediately following a meal. 08/09/14  Yes Quentin Angstlugbemiga E Daviona Herbert, MD  simvastatin (ZOCOR) 10 MG tablet Take 1 tablet (10 mg total) by mouth at bedtime. 08/09/14  Yes Quentin Angstlugbemiga E Khyrin Trevathan, MD  traMADol (ULTRAM) 50 MG tablet Take 1 tablet (50 mg total) by mouth every 12 (twelve) hours as needed. 08/09/14  Yes Quentin Angstlugbemiga E Yao Hyppolite, MD  nitroGLYCERIN (NITROGLYN) 2 % ointment Apply 0.5  inches topically 4 (four) times daily. 08/03/13   Dorothea OgleIskra M Myers, MD     Objective:   Filed Vitals:   08/09/14 1434  BP: 137/81  Pulse: 62  Temp: 98.4 F (36.9 C)  TempSrc: Oral  Resp: 16  Height: 5\' 8"  (1.727 m)  Weight: 243 lb (110.224 kg)  SpO2: 98%    Exam General appearance : Awake, alert, not in any distress. Speech Clear. Not toxic looking HEENT: Atraumatic and Normocephalic, pupils equally reactive to light and accomodation Neck: supple, no JVD. No cervical lymphadenopathy.  Chest:Good air entry bilaterally, no added sounds  CVS: S1 S2 regular, no murmurs.  Abdomen: Bowel sounds present, Non tender and not distended with no gaurding, rigidity or rebound. Extremities: B/L Lower Ext shows no edema, both legs are warm to touch Neurology: Awake alert, and oriented X 3, CN II-XII intact, Non focal  Data Review Lab Results  Component Value Date   HGBA1C 5.5 03/08/2014     Assessment & Plan   1. Essential hypertension  - metoprolol succinate (TOPROL-XL) 100 MG 24 hr tablet; Take 1 tablet (100 mg total) by mouth daily. Take with or immediately following a meal.  Dispense: 90 tablet; Refill: 3 - hydrochlorothiazide (HYDRODIURIL) 25 MG tablet; Take 1 tablet (25 mg total) by mouth daily.  Dispense: 90 tablet; Refill: 3 - amLODipine (NORVASC) 10 MG tablet; Take 1 tablet (10 mg total) by mouth daily.  Dispense: 90 tablet;  Refill: 3  2. Dyslipidemia  - simvastatin (ZOCOR) 10 MG tablet; Take 1 tablet (10 mg total) by mouth at bedtime.  Dispense: 90 tablet; Refill: 3  3. Midline low back pain without sciatica  - traMADol (ULTRAM) 50 MG tablet; Take 1 tablet (50 mg total) by mouth every 12 (twelve) hours as needed.  Dispense: 60 tablet; Refill: 0  4. Colon cancer screening  - [facility] COLONOSCOPY - Ambulatory referral to Gastroenterology   Patient was counseled extensively about nutrition and exercise   Return in about 6 months (around 02/07/2015), or if symptoms worsen or  fail to improve, for Follow up HTN, Follow up Pain and comorbidities.  The patient was given clear instructions to go to ER or return to medical center if symptoms don't improve, worsen or new problems develop. The patient verbalized understanding. The patient was told to call to get lab results if they haven't heard anything in the next week.   This note has been created with Education officer, environmental. Any transcriptional errors are unintentional.    Jeanann Lewandowsky, MD, MHA, FACP, FAAP [facility] [place], [place] [phone]   08/09/2014, 3:14 PM

## 2014-08-09 NOTE — Patient Instructions (Signed)
DASH Eating Plan DASH stands for "Dietary Approaches to Stop Hypertension." The DASH eating plan is a healthy eating plan that has been shown to reduce high blood pressure (hypertension). Additional health benefits may include reducing the risk of type 2 diabetes mellitus, heart disease, and stroke. The DASH eating plan may also help with weight loss. WHAT DO I NEED TO KNOW ABOUT THE DASH EATING PLAN? For the DASH eating plan, you will follow these general guidelines:  Choose foods with a percent daily value for sodium of less than 5% (as listed on the food label).  Use salt-free seasonings or herbs instead of table salt or sea salt.  Check with your health care provider or pharmacist before using salt substitutes.  Eat lower-sodium products, often labeled as "lower sodium" or "no salt added."  Eat fresh foods.  Eat more vegetables, fruits, and low-fat dairy products.  Choose whole grains. Look for the word "whole" as the first word in the ingredient list.  Choose fish and skinless chicken or turkey more often than red meat. Limit fish, poultry, and meat to 6 oz (170 g) each day.  Limit sweets, desserts, sugars, and sugary drinks.  Choose heart-healthy fats.  Limit cheese to 1 oz (28 g) per day.  Eat more home-cooked food and less restaurant, buffet, and fast food.  Limit fried foods.  Cook foods using methods other than frying.  Limit canned vegetables. If you do use them, rinse them well to decrease the sodium.  When eating at a restaurant, ask that your food be prepared with less salt, or no salt if possible. WHAT FOODS CAN I EAT? Seek help from a dietitian for individual calorie needs. Grains Whole grain or whole wheat bread. Brown rice. Whole grain or whole wheat pasta. Quinoa, bulgur, and whole grain cereals. Low-sodium cereals. Corn or whole wheat flour tortillas. Whole grain cornbread. Whole grain crackers. Low-sodium crackers. Vegetables Fresh or frozen vegetables  (raw, steamed, roasted, or grilled). Low-sodium or reduced-sodium tomato and vegetable juices. Low-sodium or reduced-sodium tomato sauce and paste. Low-sodium or reduced-sodium canned vegetables.  Fruits All fresh, canned (in natural juice), or frozen fruits. Meat and Other Protein Products Ground beef (85% or leaner), grass-fed beef, or beef trimmed of fat. Skinless chicken or turkey. Ground chicken or turkey. Pork trimmed of fat. All fish and seafood. Eggs. Dried beans, peas, or lentils. Unsalted nuts and seeds. Unsalted canned beans. Dairy Low-fat dairy products, such as skim or 1% milk, 2% or reduced-fat cheeses, low-fat ricotta or cottage cheese, or plain low-fat yogurt. Low-sodium or reduced-sodium cheeses. Fats and Oils Tub margarines without trans fats. Light or reduced-fat mayonnaise and salad dressings (reduced sodium). Avocado. Safflower, olive, or canola oils. Natural peanut or almond butter. Other Unsalted popcorn and pretzels. The items listed above may not be a complete list of recommended foods or beverages. Contact your dietitian for more options. WHAT FOODS ARE NOT RECOMMENDED? Grains White bread. White pasta. White rice. Refined cornbread. Bagels and croissants. Crackers that contain trans fat. Vegetables Creamed or fried vegetables. Vegetables in a cheese sauce. Regular canned vegetables. Regular canned tomato sauce and paste. Regular tomato and vegetable juices. Fruits Dried fruits. Canned fruit in light or heavy syrup. Fruit juice. Meat and Other Protein Products Fatty cuts of meat. Ribs, chicken wings, bacon, sausage, bologna, salami, chitterlings, fatback, hot dogs, bratwurst, and packaged luncheon meats. Salted nuts and seeds. Canned beans with salt. Dairy Whole or 2% milk, cream, half-and-half, and cream cheese. Whole-fat or sweetened yogurt. Full-fat   cheeses or blue cheese. Nondairy creamers and whipped toppings. Processed cheese, cheese spreads, or cheese  curds. Condiments Onion and garlic salt, seasoned salt, table salt, and sea salt. Canned and packaged gravies. Worcestershire sauce. Tartar sauce. Barbecue sauce. Teriyaki sauce. Soy sauce, including reduced sodium. Steak sauce. Fish sauce. Oyster sauce. Cocktail sauce. Horseradish. Ketchup and mustard. Meat flavorings and tenderizers. Bouillon cubes. Hot sauce. Tabasco sauce. Marinades. Taco seasonings. Relishes. Fats and Oils Butter, stick margarine, lard, shortening, ghee, and bacon fat. Coconut, palm kernel, or palm oils. Regular salad dressings. Other Pickles and olives. Salted popcorn and pretzels. The items listed above may not be a complete list of foods and beverages to avoid. Contact your dietitian for more information. WHERE CAN I FIND MORE INFORMATION? National Heart, Lung, and Blood Institute: www.nhlbi.nih.gov/health/health-topics/topics/dash/ Document Released: 06/11/2011 Document Revised: 11/06/2013 Document Reviewed: 04/26/2013 ExitCare Patient Information 2015 [employer]. This information is not intended to replace advice given to you by your health care provider. Make sure you discuss any questions you have with your health care provider. Hypertension Hypertension, commonly called high blood pressure, is when the force of blood pumping through your arteries is too strong. Your arteries are the blood vessels that carry blood from your heart throughout your body. A blood pressure reading consists of a higher number over a lower number, such as 110/72. The higher number (systolic) is the pressure inside your arteries when your heart pumps. The lower number (diastolic) is the pressure inside your arteries when your heart relaxes. Ideally you want your blood pressure below 120/80. Hypertension forces your heart to work harder to pump blood. Your arteries may become narrow or stiff. Having hypertension puts you at risk for heart disease, stroke, and other problems.  RISK  FACTORS Some risk factors for high blood pressure are controllable. Others are not.  Risk factors you cannot control include:   Race. You may be at higher risk if you are African American.  Age. Risk increases with age.  Gender. Men are at higher risk than women before age 45 years. After age 65, women are at higher risk than men. Risk factors you can control include:  Not getting enough exercise or physical activity.  Being overweight.  Getting too much fat, sugar, calories, or salt in your diet.  Drinking too much alcohol. SIGNS AND SYMPTOMS Hypertension does not usually cause signs or symptoms. Extremely high blood pressure (hypertensive crisis) may cause headache, anxiety, shortness of breath, and nosebleed. DIAGNOSIS  To check if you have hypertension, your health care provider will measure your blood pressure while you are seated, with your arm held at the level of your heart. It should be measured at least twice using the same arm. Certain conditions can cause a difference in blood pressure between your right and left arms. A blood pressure reading that is higher than normal on one occasion does not mean that you need treatment. If one blood pressure reading is high, ask your health care provider about having it checked again. TREATMENT  Treating high blood pressure includes making lifestyle changes and possibly taking medicine. Living a healthy lifestyle can help lower high blood pressure. You may need to change some of your habits. Lifestyle changes may include:  Following the DASH diet. This diet is high in fruits, vegetables, and whole grains. It is low in salt, red meat, and added sugars.  Getting at least 2 hours of brisk physical activity every week.  Losing weight if necessary.  Not smoking.  Limiting   alcoholic beverages.  Learning ways to reduce stress. If lifestyle changes are not enough to get your blood pressure under control, your health care provider may  prescribe medicine. You may need to take more than one. Work closely with your health care provider to understand the risks and benefits. HOME CARE INSTRUCTIONS  Have your blood pressure rechecked as directed by your health care provider.   Take medicines only as directed by your health care provider. Follow the directions carefully. Blood pressure medicines must be taken as prescribed. The medicine does not work as well when you skip doses. Skipping doses also puts you at risk for problems.   Do not smoke.   Monitor your blood pressure at home as directed by your health care provider. SEEK MEDICAL CARE IF:   You think you are having a reaction to medicines taken.  You have recurrent headaches or feel dizzy.  You have swelling in your ankles.  You have trouble with your vision. SEEK IMMEDIATE MEDICAL CARE IF:  You develop a severe headache or confusion.  You have unusual weakness, numbness, or feel faint.  You have severe chest or abdominal pain.  You vomit repeatedly.  You have trouble breathing. MAKE SURE YOU:   Understand these instructions.  Will watch your condition.  Will get help right away if you are not doing well or get worse. Document Released: 06/22/2005 Document Revised: 11/06/2013 Document Reviewed: 04/14/2013 ExitCare Patient Information 2015 [employer]. This information is not intended to replace advice given to you by your health care provider. Make sure you discuss any questions you have with your health care provider.  

## 2014-08-09 NOTE — Progress Notes (Signed)
Pt here for HTN f/u with med management/refills States he is compliant with medications daily Denies headache,dizziness or chest pain Due for T-Dap BP- 137/81 Requesting Tramadol refill

## 2014-09-24 ENCOUNTER — Ambulatory Visit: Payer: No Typology Code available for payment source | Attending: Internal Medicine

## 2014-11-08 ENCOUNTER — Ambulatory Visit: Payer: 59 | Attending: Internal Medicine | Admitting: Internal Medicine

## 2014-11-08 ENCOUNTER — Encounter: Payer: Self-pay | Admitting: Internal Medicine

## 2014-11-08 VITALS — BP 136/85 | HR 71 | Temp 98.6°F | Resp 16 | Wt 243.0 lb

## 2014-11-08 DIAGNOSIS — M545 Low back pain, unspecified: Secondary | ICD-10-CM

## 2014-11-08 DIAGNOSIS — M25511 Pain in right shoulder: Secondary | ICD-10-CM | POA: Insufficient documentation

## 2014-11-08 DIAGNOSIS — I10 Essential (primary) hypertension: Secondary | ICD-10-CM | POA: Diagnosis not present

## 2014-11-08 DIAGNOSIS — E785 Hyperlipidemia, unspecified: Secondary | ICD-10-CM | POA: Diagnosis not present

## 2014-11-08 MED ORDER — SIMVASTATIN 10 MG PO TABS: 10.0000 mg | ORAL_TABLET | Freq: Every day | ORAL | Status: DC

## 2014-11-08 MED ORDER — HYDROCHLOROTHIAZIDE 25 MG PO TABS: 25.0000 mg | ORAL_TABLET | Freq: Every day | ORAL | Status: DC

## 2014-11-08 MED ORDER — AMLODIPINE BESYLATE 10 MG PO TABS: 10.0000 mg | ORAL_TABLET | Freq: Every day | ORAL | Status: DC

## 2014-11-08 MED ORDER — METOPROLOL SUCCINATE ER 100 MG PO TB24: 100.0000 mg | ORAL_TABLET | Freq: Every day | ORAL | Status: DC

## 2014-11-08 MED ORDER — TRAMADOL HCL 50 MG PO TABS
50.0000 mg | ORAL_TABLET | Freq: Two times a day (BID) | ORAL | Status: DC | PRN
Start: 2014-11-08 — End: 2015-05-16

## 2014-11-08 NOTE — Patient Instructions (Signed)
DASH Eating Plan DASH stands for "Dietary Approaches to Stop Hypertension." The DASH eating plan is a healthy eating plan that has been shown to reduce high blood pressure (hypertension). Additional health benefits may include reducing the risk of type 2 diabetes mellitus, heart disease, and stroke. The DASH eating plan may also help with weight loss. WHAT DO I NEED TO KNOW ABOUT THE DASH EATING PLAN? For the DASH eating plan, you will follow these general guidelines:  Choose foods with a percent daily value for sodium of less than 5% (as listed on the food label).  Use salt-free seasonings or herbs instead of table salt or sea salt.  Check with your health care provider or pharmacist before using salt substitutes.  Eat lower-sodium products, often labeled as "lower sodium" or "no salt added."  Eat fresh foods.  Eat more vegetables, fruits, and low-fat dairy products.  Choose whole grains. Look for the word "whole" as the first word in the ingredient list.  Choose fish and skinless chicken or turkey more often than red meat. Limit fish, poultry, and meat to 6 oz (170 g) each day.  Limit sweets, desserts, sugars, and sugary drinks.  Choose heart-healthy fats.  Limit cheese to 1 oz (28 g) per day.  Eat more home-cooked food and less restaurant, buffet, and fast food.  Limit fried foods.  Cook foods using methods other than frying.  Limit canned vegetables. If you do use them, rinse them well to decrease the sodium.  When eating at a restaurant, ask that your food be prepared with less salt, or no salt if possible. WHAT FOODS CAN I EAT? Seek help from a dietitian for individual calorie needs. Grains Whole grain or whole wheat bread. Brown rice. Whole grain or whole wheat pasta. Quinoa, bulgur, and whole grain cereals. Low-sodium cereals. Corn or whole wheat flour tortillas. Whole grain cornbread. Whole grain crackers. Low-sodium crackers. Vegetables Fresh or frozen vegetables  (raw, steamed, roasted, or grilled). Low-sodium or reduced-sodium tomato and vegetable juices. Low-sodium or reduced-sodium tomato sauce and paste. Low-sodium or reduced-sodium canned vegetables.  Fruits All fresh, canned (in natural juice), or frozen fruits. Meat and Other Protein Products Ground beef (85% or leaner), grass-fed beef, or beef trimmed of fat. Skinless chicken or turkey. Ground chicken or turkey. Pork trimmed of fat. All fish and seafood. Eggs. Dried beans, peas, or lentils. Unsalted nuts and seeds. Unsalted canned beans. Dairy Low-fat dairy products, such as skim or 1% milk, 2% or reduced-fat cheeses, low-fat ricotta or cottage cheese, or plain low-fat yogurt. Low-sodium or reduced-sodium cheeses. Fats and Oils Tub margarines without trans fats. Light or reduced-fat mayonnaise and salad dressings (reduced sodium). Avocado. Safflower, olive, or canola oils. Natural peanut or almond butter. Other Unsalted popcorn and pretzels. The items listed above may not be a complete list of recommended foods or beverages. Contact your dietitian for more options. WHAT FOODS ARE NOT RECOMMENDED? Grains White bread. White pasta. White rice. Refined cornbread. Bagels and croissants. Crackers that contain trans fat. Vegetables Creamed or fried vegetables. Vegetables in a cheese sauce. Regular canned vegetables. Regular canned tomato sauce and paste. Regular tomato and vegetable juices. Fruits Dried fruits. Canned fruit in light or heavy syrup. Fruit juice. Meat and Other Protein Products Fatty cuts of meat. Ribs, chicken wings, bacon, sausage, bologna, salami, chitterlings, fatback, hot dogs, bratwurst, and packaged luncheon meats. Salted nuts and seeds. Canned beans with salt. Dairy Whole or 2% milk, cream, half-and-half, and cream cheese. Whole-fat or sweetened yogurt. Full-fat   cheeses or blue cheese. Nondairy creamers and whipped toppings. Processed cheese, cheese spreads, or cheese  curds. Condiments Onion and garlic salt, seasoned salt, table salt, and sea salt. Canned and packaged gravies. Worcestershire sauce. Tartar sauce. Barbecue sauce. Teriyaki sauce. Soy sauce, including reduced sodium. Steak sauce. Fish sauce. Oyster sauce. Cocktail sauce. Horseradish. Ketchup and mustard. Meat flavorings and tenderizers. Bouillon cubes. Hot sauce. Tabasco sauce. Marinades. Taco seasonings. Relishes. Fats and Oils Butter, stick margarine, lard, shortening, ghee, and bacon fat. Coconut, palm kernel, or palm oils. Regular salad dressings. Other Pickles and olives. Salted popcorn and pretzels. The items listed above may not be a complete list of foods and beverages to avoid. Contact your dietitian for more information. WHERE CAN I FIND MORE INFORMATION? National Heart, Lung, and Blood Institute: www.nhlbi.nih.gov/health/health-topics/topics/dash/ Document Released: 06/11/2011 Document Revised: 11/06/2013 Document Reviewed: 04/26/2013 ExitCare Patient Information 2015 [employer]. This information is not intended to replace advice given to you by your health care provider. Make sure you discuss any questions you have with your health care provider. Hypertension Hypertension, commonly called high blood pressure, is when the force of blood pumping through your arteries is too strong. Your arteries are the blood vessels that carry blood from your heart throughout your body. A blood pressure reading consists of a higher number over a lower number, such as 110/72. The higher number (systolic) is the pressure inside your arteries when your heart pumps. The lower number (diastolic) is the pressure inside your arteries when your heart relaxes. Ideally you want your blood pressure below 120/80. Hypertension forces your heart to work harder to pump blood. Your arteries may become narrow or stiff. Having hypertension puts you at risk for heart disease, stroke, and other problems.  RISK  FACTORS Some risk factors for high blood pressure are controllable. Others are not.  Risk factors you cannot control include:   Race. You may be at higher risk if you are African American.  Age. Risk increases with age.  Gender. Men are at higher risk than women before age 45 years. After age 65, women are at higher risk than men. Risk factors you can control include:  Not getting enough exercise or physical activity.  Being overweight.  Getting too much fat, sugar, calories, or salt in your diet.  Drinking too much alcohol. SIGNS AND SYMPTOMS Hypertension does not usually cause signs or symptoms. Extremely high blood pressure (hypertensive crisis) may cause headache, anxiety, shortness of breath, and nosebleed. DIAGNOSIS  To check if you have hypertension, your health care provider will measure your blood pressure while you are seated, with your arm held at the level of your heart. It should be measured at least twice using the same arm. Certain conditions can cause a difference in blood pressure between your right and left arms. A blood pressure reading that is higher than normal on one occasion does not mean that you need treatment. If one blood pressure reading is high, ask your health care provider about having it checked again. TREATMENT  Treating high blood pressure includes making lifestyle changes and possibly taking medicine. Living a healthy lifestyle can help lower high blood pressure. You may need to change some of your habits. Lifestyle changes may include:  Following the DASH diet. This diet is high in fruits, vegetables, and whole grains. It is low in salt, red meat, and added sugars.  Getting at least 2 hours of brisk physical activity every week.  Losing weight if necessary.  Not smoking.  Limiting   alcoholic beverages.  Learning ways to reduce stress. If lifestyle changes are not enough to get your blood pressure under control, your health care provider may  prescribe medicine. You may need to take more than one. Work closely with your health care provider to understand the risks and benefits. HOME CARE INSTRUCTIONS  Have your blood pressure rechecked as directed by your health care provider.   Take medicines only as directed by your health care provider. Follow the directions carefully. Blood pressure medicines must be taken as prescribed. The medicine does not work as well when you skip doses. Skipping doses also puts you at risk for problems.   Do not smoke.   Monitor your blood pressure at home as directed by your health care provider. SEEK MEDICAL CARE IF:   You think you are having a reaction to medicines taken.  You have recurrent headaches or feel dizzy.  You have swelling in your ankles.  You have trouble with your vision. SEEK IMMEDIATE MEDICAL CARE IF:  You develop a severe headache or confusion.  You have unusual weakness, numbness, or feel faint.  You have severe chest or abdominal pain.  You vomit repeatedly.  You have trouble breathing. MAKE SURE YOU:   Understand these instructions.  Will watch your condition.  Will get help right away if you are not doing well or get worse. Document Released: 06/22/2005 Document Revised: 11/06/2013 Document Reviewed: 04/14/2013 ExitCare Patient Information 2015 [employer]. This information is not intended to replace advice given to you by your health care provider. Make sure you discuss any questions you have with your health care provider.  

## 2014-11-08 NOTE — Progress Notes (Signed)
Patient complains of having back pain to his upper right side Patient states he is also here to have his blood pressure checked And his chronic right heel pain checked

## 2014-11-08 NOTE — Progress Notes (Signed)
Patient ID: Damon Peck, male   DOB: 06/02/1966, 49 y.o.   MRN: [id number]   Damon Peck, is a 49 y.o. male  [id number]  [id number]  DOB - 12/12/1965  Chief Complaint  Patient presents with  . Back Pain        Subjective:   Damon Peck is a 49 y.o. male here today for a follow up visit. Patient has history of hypertension on amlodipine 10 mg tablet by mouth daily, hydrochlorothiazide 25 mg tablet by mouth daily, and Toprol-XL 100 milligrams tablet by mouth daily,  Blood pressure has been controlled, range between systolic 110 and [id number] over diastolic BETWEEN 69 and 82 mmHg.  Patient is here today for routine follow-up. He complained of right shoulder pain,  Ongoing , on and off for months/years. 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 There is no swelling or redness. Patient reports no injury. Patient has No headache, No chest pain, No abdominal pain - No Nausea, No new weakness tingling or numbness, No Cough - SOB.  No problems updated.  ALLERGIES: No Known Allergies  PAST MEDICAL HISTORY: Past Medical History  Diagnosis Date  . Hypertension     MEDICATIONS AT HOME: Prior to Admission medications   Medication Sig Start Date End Date Taking? Authorizing Provider  amLODipine (NORVASC) 10 MG tablet Take 1 tablet (10 mg total) by mouth daily. 11/08/14   Quentin Angstlugbemiga E Meekah Math, MD  hydrochlorothiazide (HYDRODIURIL) 25 MG tablet Take 1 tablet (25 mg total) by mouth daily. 11/08/14   Quentin Angstlugbemiga E Christiann Hagerty, MD  metoprolol succinate (TOPROL-XL) 100 MG 24 hr tablet Take 1 tablet (100 mg total) by mouth daily. Take with or immediately following a meal. 11/08/14   Quentin Angstlugbemiga E Kamaree Wheatley, MD  nitroGLYCERIN (NITROGLYN) 2 % ointment Apply 0.5 inches topically 4 (four) times daily. 08/03/13   Dorothea OgleIskra M Myers, MD  simvastatin (ZOCOR) 10 MG tablet Take 1 tablet (10 mg total) by mouth at bedtime. 11/08/14   Quentin Angstlugbemiga E Kodah Maret, MD    traMADol (ULTRAM) 50 MG tablet Take 1 tablet (50 mg total) by mouth every 12 (twelve) hours as needed. 11/08/14   Quentin Angstlugbemiga E Logyn Kendrick, MD     Objective:   Filed Vitals:   11/08/14 1701  BP: 136/85  Pulse: 71  Temp: 98.6 F (37 C)  Resp: 16  Weight: 243 lb (110.224 kg)  SpO2: 100%    Exam General appearance : Awake, alert, not in any distress. Speech Clear. Not toxic looking HEENT: Atraumatic and Normocephalic, pupils equally reactive to light and accomodation Neck: supple, no JVD. No cervical lymphadenopathy.  Chest:Good air entry bilaterally, no added sounds  CVS: S1 S2 regular, no murmurs.  Abdomen: Bowel sounds present, Non tender and not distended with no gaurding, rigidity or rebound. Extremities: B/L Lower Ext shows no edema, both legs are warm to touch. Right shoulder joint range of motion normal. Neurology: Awake alert, and oriented X 3, CN II-XII intact, Non focal Skin:No Rash  Data Review Lab Results  Component Value Date   HGBA1C 5.5 03/08/2014     Assessment & Plan   1. Essential hypertension  - amLODipine (NORVASC) 10 MG tablet; Take 1 tablet (10 mg total) by mouth daily.  Dispense: 90 tablet; Refill: 3 - hydrochlorothiazide (HYDRODIURIL) 25 MG tablet; Take 1 tablet (25 mg total) by mouth daily.  Dispense: 90 tablet; Refill: 3 - metoprolol succinate (TOPROL-XL) 100 MG 24 hr tablet; Take  1 tablet (100 mg total) by mouth daily. Take with or immediately following a meal.  Dispense: 90 tablet; Refill: 3  - We have discussed target BP range and blood pressure goal - I have advised patient to check BP regularly and to call us back or report to clinic if the numbers are consistently higher than 140/90  - We discussed the importance of compliance with medical therapy and DASH diet recommended, consequences of uncontrolled hypertension discussed.  - continue current BP medications  2. Dyslipidemia  - simvastatin (ZOCOR) 10 MG tablet; Take 1 tablet (10 mg total)  by mouth at bedtime.  Dispense: 90 tablet; Refill: 3  To address this please limit saturated fat to no more than 7% of your calories, limit cholesterol to 200 mg/day, increase fiber and exercise as tolerated. If needed we may add another cholesterol lowering medication to your regimen.   3. Midline low back pain without sciatica  - traMADol (ULTRAM) 50 MG tablet; Take 1 tablet (50 mg total) by mouth every 12 (twelve) hours as needed.  Dispense: 90 tablet; Refill: 0  Patient have been counseled extensively about nutrition and exercise  Return in about 3 months (around 02/08/2015) for Follow up HTN, Follow up Pain and comorbidities, Annual Physical.  The patient was given clear instructions to go to ER or return to medical center if symptoms don't improve, worsen or new problems develop. The patient verbalized understanding. The patient was told to call to get lab results if they haven't heard anything in the next week.   This note has been created with Education officer, environmentalDragon speech recognition software and smart phrase technology. Any transcriptional errors are unintentional.    Jeanann LewandowskyJEGEDE, Reilyn Nelson, MD, MHA, CPE, FACP, FAAP [facility] [place] , [place] [phone]   11/08/2014, 5:30 PM

## 2014-11-14 ENCOUNTER — Other Ambulatory Visit: Payer: Self-pay | Admitting: Gastroenterology

## 2015-01-31 ENCOUNTER — Ambulatory Visit: Payer: 59 | Attending: Internal Medicine | Admitting: Internal Medicine

## 2015-01-31 ENCOUNTER — Encounter: Payer: Self-pay | Admitting: Internal Medicine

## 2015-01-31 VITALS — BP 137/88 | HR 69 | Temp 98.4°F | Resp 18 | Ht 69.0 in | Wt 239.0 lb

## 2015-01-31 DIAGNOSIS — I10 Essential (primary) hypertension: Secondary | ICD-10-CM | POA: Diagnosis not present

## 2015-01-31 DIAGNOSIS — E785 Hyperlipidemia, unspecified: Secondary | ICD-10-CM | POA: Insufficient documentation

## 2015-01-31 MED ORDER — METOPROLOL SUCCINATE ER 100 MG PO TB24: 100.0000 mg | ORAL_TABLET | Freq: Every day | ORAL | Status: DC

## 2015-01-31 MED ORDER — SIMVASTATIN 10 MG PO TABS: 10.0000 mg | ORAL_TABLET | Freq: Every day | ORAL | Status: DC

## 2015-01-31 MED ORDER — HYDROCHLOROTHIAZIDE 25 MG PO TABS: 25.0000 mg | ORAL_TABLET | Freq: Every day | ORAL | Status: DC

## 2015-01-31 MED ORDER — AMLODIPINE BESYLATE 10 MG PO TABS: 10.0000 mg | ORAL_TABLET | Freq: Every day | ORAL | Status: DC

## 2015-01-31 NOTE — Progress Notes (Signed)
Patient here for 3 month follow up and medication refills. Patient denies any pain today. Patient has taken his hydrochlorothiazide and metoprolol today. Patient needs refills on all medications.

## 2015-01-31 NOTE — Progress Notes (Signed)
Patient ID: Damon Peck, male   DOB: Jul 02, 1966, 49 y.o.   MRN: [id number]   Damon Peck, is a 49 y.o. male  [id number]  NFA:[id number]  DOB - 1966-03-31  Chief Complaint  Patient presents with  . Follow-up        Subjective:   Damon Peck is a 49 y.o. male here today for a follow up visit. Patient has history of hypertension and dyslipidemia, he will be [place] to [place] and needs refill of all his medications. He has no complaint today. He is scheduled to have colonoscopy next week. Patient is present in the clinic today with his wife. He is compliant with medications, reports no side effects, blood pressure is controlled. Patient has No headache, No chest pain, No abdominal pain - No Nausea, No new weakness tingling or numbness, No Cough - SOB.  No problems updated.  ALLERGIES: No Known Allergies  PAST MEDICAL HISTORY: Past Medical History  Diagnosis Date  . Hypertension     MEDICATIONS AT HOME: Prior to Admission medications   Medication Sig Start Date End Date Taking? Authorizing Provider  amLODipine (NORVASC) 10 MG tablet Take 1 tablet (10 mg total) by mouth daily. 01/31/15  Yes Quentin Angst, MD  hydrochlorothiazide (HYDRODIURIL) 25 MG tablet Take 1 tablet (25 mg total) by mouth daily. 01/31/15  Yes Quentin Angst, MD  metoprolol succinate (TOPROL-XL) 100 MG 24 hr tablet Take 1 tablet (100 mg total) by mouth daily. Take with or immediately following a meal. 01/31/15  Yes Lenetta Piche E Hyman Hopes, MD  nitroGLYCERIN (NITROGLYN) 2 % ointment Apply 0.5 inches topically 4 (four) times daily. 08/03/13  Yes Dorothea Ogle, MD  simvastatin (ZOCOR) 10 MG tablet Take 1 tablet (10 mg total) by mouth at bedtime. 01/31/15  Yes Addis Bennie Annitta Needs, MD  traMADol (ULTRAM) 50 MG tablet Take 1 tablet (50 mg total) by mouth every 12 (twelve) hours as needed. Patient taking differently: Take 50 mg by mouth every 12 (twelve) hours as needed for moderate pain.  11/08/14   Yes Quentin Angst, MD     Objective:   Filed Vitals:   01/31/15 1015  BP: 137/88  Pulse: 69  Temp: 98.4 F (36.9 C)  TempSrc: Oral  Resp: 18  Height: 5\' 9"  (1.753 m)  Weight: 239 lb (108.41 kg)  SpO2: 96%    Exam General appearance : Awake, alert, not in any distress. Speech Clear. Not toxic looking HEENT: Atraumatic and Normocephalic, pupils equally reactive to light and accomodation Neck: supple, no JVD. No cervical lymphadenopathy.  Chest:Good air entry bilaterally, no added sounds  CVS: S1 S2 regular, no murmurs.  Abdomen: Bowel sounds present, Non tender and not distended with no gaurding, rigidity or rebound. Extremities: B/L Lower Ext shows no edema, both legs are warm to touch Neurology: Awake alert, and oriented X 3, CN II-XII intact, Non focal Skin:No Rash  Data Review Lab Results  Component Value Date   HGBA1C 5.5 03/08/2014     Assessment & Plan   1. Essential hypertension: Controlled  - amLODipine (NORVASC) 10 MG tablet; Take 1 tablet (10 mg total) by mouth daily.  Dispense: 90 tablet; Refill: 3 - hydrochlorothiazide (HYDRODIURIL) 25 MG tablet; Take 1 tablet (25 mg total) by mouth daily.  Dispense: 90 tablet; Refill: 3 - metoprolol succinate (TOPROL-XL) 100 MG 24 hr tablet; Take 1 tablet (100 mg total) by mouth daily. Take with or immediately following a meal.  Dispense: 90 tablet;  Refill: 3  - We have discussed target BP range and blood pressure goal - I have advised patient to check BP regularly and to call us back or report to clinic if the numbers are consistently higher than 140/90  - We discussed the importance of compliance with medical therapy and DASH diet recommended, consequences of uncontrolled hypertension discussed.  - continue current BP medications  2. Dyslipidemia  - simvastatin (ZOCOR) 10 MG tablet; Take 1 tablet (10 mg total) by mouth at bedtime.  Dispense: 90 tablet; Refill: 3  To address this please limit saturated fat to  no more than 7% of your calories, limit cholesterol to 200 mg/day, increase fiber and exercise as tolerated. If needed we may add another cholesterol lowering medication to your regimen.   Patient have been counseled extensively about nutrition and exercise Return in about 3 months (around 05/03/2015) for Follow up HTN, Dyslipidemia.  The patient was given clear instructions to go to ER or return to medical center if symptoms don't improve, worsen or new problems develop. The patient verbalized understanding. The patient was told to call to get lab results if they haven't heard anything in the next week.   This note has been created with Education officer, environmental. Any transcriptional errors are unintentional.    Jeanann Lewandowsky, MD, MHA, CPE, FACP, FAAP [facility] [place], [place] [phone]   01/31/2015, 10:59 AM

## 2015-01-31 NOTE — Patient Instructions (Signed)
DASH Eating Plan DASH stands for "Dietary Approaches to Stop Hypertension." The DASH eating plan is a healthy eating plan that has been shown to reduce high blood pressure (hypertension). Additional health benefits may include reducing the risk of type 2 diabetes mellitus, heart disease, and stroke. The DASH eating plan may also help with weight loss. WHAT DO I NEED TO KNOW ABOUT THE DASH EATING PLAN? For the DASH eating plan, you will follow these general guidelines:  Choose foods with a percent daily value for sodium of less than 5% (as listed on the food label).  Use salt-free seasonings or herbs instead of table salt or sea salt.  Check with your health care provider or pharmacist before using salt substitutes.  Eat lower-sodium products, often labeled as "lower sodium" or "no salt added."  Eat fresh foods.  Eat more vegetables, fruits, and low-fat dairy products.  Choose whole grains. Look for the word "whole" as the first word in the ingredient list.  Choose fish and skinless chicken or turkey more often than red meat. Limit fish, poultry, and meat to 6 oz (170 g) each day.  Limit sweets, desserts, sugars, and sugary drinks.  Choose heart-healthy fats.  Limit cheese to 1 oz (28 g) per day.  Eat more home-cooked food and less restaurant, buffet, and fast food.  Limit fried foods.  Cook foods using methods other than frying.  Limit canned vegetables. If you do use them, rinse them well to decrease the sodium.  When eating at a restaurant, ask that your food be prepared with less salt, or no salt if possible. WHAT FOODS CAN I EAT? Seek help from a dietitian for individual calorie needs. Grains Whole grain or whole wheat bread. Brown rice. Whole grain or whole wheat pasta. Quinoa, bulgur, and whole grain cereals. Low-sodium cereals. Corn or whole wheat flour tortillas. Whole grain cornbread. Whole grain crackers. Low-sodium crackers. Vegetables Fresh or frozen vegetables  (raw, steamed, roasted, or grilled). Low-sodium or reduced-sodium tomato and vegetable juices. Low-sodium or reduced-sodium tomato sauce and paste. Low-sodium or reduced-sodium canned vegetables.  Fruits All fresh, canned (in natural juice), or frozen fruits. Meat and Other Protein Products Ground beef (85% or leaner), grass-fed beef, or beef trimmed of fat. Skinless chicken or turkey. Ground chicken or turkey. Pork trimmed of fat. All fish and seafood. Eggs. Dried beans, peas, or lentils. Unsalted nuts and seeds. Unsalted canned beans. Dairy Low-fat dairy products, such as skim or 1% milk, 2% or reduced-fat cheeses, low-fat ricotta or cottage cheese, or plain low-fat yogurt. Low-sodium or reduced-sodium cheeses. Fats and Oils Tub margarines without trans fats. Light or reduced-fat mayonnaise and salad dressings (reduced sodium). Avocado. Safflower, olive, or canola oils. Natural peanut or almond butter. Other Unsalted popcorn and pretzels. The items listed above may not be a complete list of recommended foods or beverages. Contact your dietitian for more options. WHAT FOODS ARE NOT RECOMMENDED? Grains White bread. White pasta. White rice. Refined cornbread. Bagels and croissants. Crackers that contain trans fat. Vegetables Creamed or fried vegetables. Vegetables in a cheese sauce. Regular canned vegetables. Regular canned tomato sauce and paste. Regular tomato and vegetable juices. Fruits Dried fruits. Canned fruit in light or heavy syrup. Fruit juice. Meat and Other Protein Products Fatty cuts of meat. Ribs, chicken wings, bacon, sausage, bologna, salami, chitterlings, fatback, hot dogs, bratwurst, and packaged luncheon meats. Salted nuts and seeds. Canned beans with salt. Dairy Whole or 2% milk, cream, half-and-half, and cream cheese. Whole-fat or sweetened yogurt. Full-fat   cheeses or blue cheese. Nondairy creamers and whipped toppings. Processed cheese, cheese spreads, or cheese  curds. Condiments Onion and garlic salt, seasoned salt, table salt, and sea salt. Canned and packaged gravies. Worcestershire sauce. Tartar sauce. Barbecue sauce. Teriyaki sauce. Soy sauce, including reduced sodium. Steak sauce. Fish sauce. Oyster sauce. Cocktail sauce. Horseradish. Ketchup and mustard. Meat flavorings and tenderizers. Bouillon cubes. Hot sauce. Tabasco sauce. Marinades. Taco seasonings. Relishes. Fats and Oils Butter, stick margarine, lard, shortening, ghee, and bacon fat. Coconut, palm kernel, or palm oils. Regular salad dressings. Other Pickles and olives. Salted popcorn and pretzels. The items listed above may not be a complete list of foods and beverages to avoid. Contact your dietitian for more information. WHERE CAN I FIND MORE INFORMATION? National [employer], [employer], [employer]: [email or web address] Document Released: 06/11/2011 Document Revised: 11/06/2013 Document Reviewed: 04/26/2013 [facility] Patient Information 2015 [place], [place]. This information is not intended to replace advice given to you by your health care provider. Make sure you discuss any questions you have with your health care provider. Dyslipidemia Dyslipidemia is an imbalance of the lipids in your blood. Lipids are waxy, fat-like proteins that your body needs in small amounts. Dyslipidemia often involves the lipids cholesterol or triglycerides. Common forms of dyslipidemia are:  High levels of bad cholesterol (LDL cholesterol). LDL cholesterol is the type of cholesterol that causes heart disease.  Low levels of good cholesterol (HDL cholesterol). HDL cholesterol is the type of cholesterol that helps protect against heart disease.  High levels of triglycerides. Triglycerides are a fatty substance in the blood linked to a buildup of plaque on your arteries. RISK FACTORS  Increased age.  Having a family history of high cholesterol.  Certain medicines, including  birth control pills, diuretics, beta-blockers, and some medicines for depression.  Smoking.  Eating a high-fat diet.  Being overweight.  Medical conditions such as diabetes, polycystic ovary syndrome, pregnancy, kidney disease, and hypothyroidism.  Lack of regular exercise. SIGNS AND SYMPTOMS There are no signs or symptoms with dyslipidemia.  DIAGNOSIS  A simple blood test called a fasting blood test can be done to determine your level of:  Total cholesterol. This is the combined number of LDL cholesterol and HDL cholesterol. A healthy number is lower than 200.  LDL cholesterol. The goal number for LDL cholesterol is different for each person depending on risk factors. Ask your health care provider what your LDL cholesterol number should be.  HDL cholesterol. A healthy level of HDL cholesterol is 60 or higher. A number lower than 40 for men or 50 for women is a danger sign.  Triglycerides. A healthy triglyceride number is less than 150. TREATMENT  Dyslipidemia is a treatable condition. Your health care provider will advise you on what type of treatment is best based on your age, your test results, and current guidelines. Treatment may include:   Dietary changes. A dietitian can help you create a meal plan. You may need to:  Eat more foods that contain omega-3s, such as salmon and other fish.  Replace saturated fats and trans fats in your diet with healthy fats such as nuts, seeds, avocados, olive oil, and canola oil.  Regular exercise. This can help lower your LDL cholesterol, raise your HDL cholesterol, and help with weight management. Check with your health care provider before beginning an exercise program. Most people should participate in 30 minutes of brisk exercise 5 days a week.  Quitting smoking.  Medicines to lower LDL cholesterol and triglycerides. Your health care  provider will monitor your lipid levels with regular blood tests. HOME CARE INSTRUCTIONS  Eat a  healthy diet. Follow any diet instructions if they were given to you by your health care provider.  Maintain a healthy weight.  Exercise regularly based on the recommendations of your health care provider.  Do not use any tobacco products, including cigarettes, chewing tobacco, or electronic cigarettes.  Take medicines only as directed by your health care provider.  Keep all follow-up visits as directed by your health care provider. SEEK MEDICAL CARE IF: You are having possible side effects from your medicines. Document Released: 06/27/2013 Document Revised: 11/06/2013 Document Reviewed: 06/27/2013 [employer] Patient Information 2015 [employer]. This information is not intended to replace advice given to you by your health care provider. Make sure you discuss any questions you have with your health care provider. Hypertension Hypertension, commonly called high blood pressure, is when the force of blood pumping through your arteries is too strong. Your arteries are the blood vessels that carry blood from your heart throughout your body. A blood pressure reading consists of a higher number over a lower number, such as 110/72. The higher number (systolic) is the pressure inside your arteries when your heart pumps. The lower number (diastolic) is the pressure inside your arteries when your heart relaxes. Ideally you want your blood pressure below 120/80. Hypertension forces your heart to work harder to pump blood. Your arteries may become narrow or stiff. Having hypertension puts you at risk for heart disease, stroke, and other problems.  RISK FACTORS Some risk factors for high blood pressure are controllable. Others are not.  Risk factors you cannot control include:   Race. You may be at higher risk if you are African American.  Age. Risk increases with age.  Gender. Men are at higher risk than women before age 45 years. After age 65, women are at higher risk than men. Risk factors you can  control include:  Not getting enough exercise or physical activity.  Being overweight.  Getting too much fat, sugar, calories, or salt in your diet.  Drinking too much alcohol. SIGNS AND SYMPTOMS Hypertension does not usually cause signs or symptoms. Extremely high blood pressure (hypertensive crisis) may cause headache, anxiety, shortness of breath, and nosebleed. DIAGNOSIS  To check if you have hypertension, your health care provider will measure your blood pressure while you are seated, with your arm held at the level of your heart. It should be measured at least twice using the same arm. Certain conditions can cause a difference in blood pressure between your right and left arms. A blood pressure reading that is higher than normal on one occasion does not mean that you need treatment. If one blood pressure reading is high, ask your health care provider about having it checked again. TREATMENT  Treating high blood pressure includes making lifestyle changes and possibly taking medicine. Living a healthy lifestyle can help lower high blood pressure. You may need to change some of your habits. Lifestyle changes may include:  Following the DASH diet. This diet is high in fruits, vegetables, and whole grains. It is low in salt, red meat, and added sugars.  Getting at least 2 hours of brisk physical activity every week.  Losing weight if necessary.  Not smoking.  Limiting alcoholic beverages.  Learning ways to reduce stress. If lifestyle changes are not enough to get your blood pressure under control, your health care provider may prescribe medicine. You may need to take more than   one. Work closely with your health care provider to understand the risks and benefits. HOME CARE INSTRUCTIONS  Have your blood pressure rechecked as directed by your health care provider.   Take medicines only as directed by your health care provider. Follow the directions carefully. Blood pressure  medicines must be taken as prescribed. The medicine does not work as well when you skip doses. Skipping doses also puts you at risk for problems.   Do not smoke.   Monitor your blood pressure at home as directed by your health care provider. SEEK MEDICAL CARE IF:   You think you are having a reaction to medicines taken.  You have recurrent headaches or feel dizzy.  You have swelling in your ankles.  You have trouble with your vision. SEEK IMMEDIATE MEDICAL CARE IF:  You develop a severe headache or confusion.  You have unusual weakness, numbness, or feel faint.  You have severe chest or abdominal pain.  You vomit repeatedly.  You have trouble breathing. MAKE SURE YOU:   Understand these instructions.  Will watch your condition.  Will get help right away if you are not doing well or get worse. Document Released: 06/22/2005 Document Revised: 11/06/2013 Document Reviewed: 04/14/2013 [employer] Patient Information 2015 [employer]. This information is not intended to replace advice given to you by your health care provider. Make sure you discuss any questions you have with your health care provider.  

## 2015-02-04 ENCOUNTER — Encounter (HOSPITAL_COMMUNITY): Payer: Self-pay | Admitting: *Deleted

## 2015-02-08 ENCOUNTER — Other Ambulatory Visit: Payer: Self-pay | Admitting: Gastroenterology

## 2015-02-11 ENCOUNTER — Ambulatory Visit (HOSPITAL_COMMUNITY): Payer: 59 | Admitting: Anesthesiology

## 2015-02-11 ENCOUNTER — Encounter (HOSPITAL_COMMUNITY)
Admission: RE | Disposition: A | Discharge status: Home / Self Care | Payer: Self-pay | Source: Ambulatory Visit | Attending: Gastroenterology

## 2015-02-11 ENCOUNTER — Ambulatory Visit (HOSPITAL_COMMUNITY)
Admission: RE | Admit: 2015-02-11 | Discharge: 2015-02-11 | Disposition: A | Discharge status: Home / Self Care | Payer: 59 | Source: Ambulatory Visit | Attending: Gastroenterology | Admitting: Gastroenterology

## 2015-02-11 ENCOUNTER — Encounter (HOSPITAL_COMMUNITY): Payer: Self-pay | Admitting: Anesthesiology

## 2015-02-11 DIAGNOSIS — I10 Essential (primary) hypertension: Secondary | ICD-10-CM | POA: Insufficient documentation

## 2015-02-11 DIAGNOSIS — Z1211 Encounter for screening for malignant neoplasm of colon: Secondary | ICD-10-CM | POA: Insufficient documentation

## 2015-02-11 DIAGNOSIS — K5730 Diverticulosis of large intestine without perforation or abscess without bleeding: Secondary | ICD-10-CM | POA: Insufficient documentation

## 2015-02-11 DIAGNOSIS — E780 Pure hypercholesterolemia: Secondary | ICD-10-CM | POA: Diagnosis not present

## 2015-02-11 HISTORY — PX: COLONOSCOPY WITH PROPOFOL: SHX5780

## 2015-02-11 SURGERY — COLONOSCOPY WITH PROPOFOL
Anesthesia: Monitor Anesthesia Care

## 2015-02-11 MED ORDER — SODIUM CHLORIDE 0.9 % IV SOLN: INTRAVENOUS | Status: DC

## 2015-02-11 MED ORDER — PROPOFOL 10 MG/ML IV BOLUS
INTRAVENOUS | Status: AC
  Filled 2015-02-11: qty 20

## 2015-02-11 MED ORDER — PROPOFOL INFUSION 10 MG/ML OPTIME
INTRAVENOUS | Status: DC | PRN
  Administered 2015-02-11: 250 ug/kg/min via INTRAVENOUS

## 2015-02-11 MED ORDER — PROPOFOL 500 MG/50ML IV EMUL
INTRAVENOUS | Status: DC | PRN
  Administered 2015-02-11: 50 mg via INTRAVENOUS

## 2015-02-11 MED ORDER — LACTATED RINGERS IV SOLN
INTRAVENOUS | Status: DC | PRN
  Administered 2015-02-11: 13:00:00 via INTRAVENOUS

## 2015-02-11 MED ORDER — LACTATED RINGERS IV SOLN
Freq: Once | INTRAVENOUS | Status: AC
  Administered 2015-02-11: 1000 mL via INTRAVENOUS

## 2015-02-11 SURGICAL SUPPLY — 22 items

## 2015-02-11 NOTE — Discharge Instructions (Signed)
Colonoscopy, Care After °Refer to this sheet in the next few weeks. These instructions provide you with information on caring for yourself after your procedure. Your health care provider may also give you more specific instructions. Your treatment has been planned according to current medical practices, but problems sometimes occur. Call your health care provider if you have any problems or questions after your procedure. °WHAT TO EXPECT AFTER THE PROCEDURE  °After your procedure, it is typical to have the following: °· A small amount of blood in your stool. °· Moderate amounts of gas and mild abdominal cramping or bloating. °HOME CARE INSTRUCTIONS °· Do not drive, operate machinery, or sign important documents for 24 hours. °· You may shower and resume your regular physical activities, but move at a slower pace for the first 24 hours. °· Take frequent rest periods for the first 24 hours. °· Walk around or put a warm pack on your abdomen to help reduce abdominal cramping and bloating. °· Drink enough fluids to keep your urine clear or pale yellow. °· You may resume your normal diet as instructed by your health care provider. Avoid heavy or fried foods that are hard to digest. °· Avoid drinking alcohol for 24 hours or as instructed by your health care provider. °· Only take over-the-counter or prescription medicines as directed by your health care provider. °· If a tissue sample (biopsy) was taken during your procedure: °· Do not take aspirin or blood thinners for 7 days, or as instructed by your health care provider. °· Do not drink alcohol for 7 days, or as instructed by your health care provider. °· Eat soft foods for the first 24 hours. °SEEK MEDICAL CARE IF: °You have persistent spotting of blood in your stool 2-3 days after the procedure. °SEEK IMMEDIATE MEDICAL CARE IF: °· You have more than a small spotting of blood in your stool. °· You pass large blood clots in your stool. °· Your abdomen is swollen  (distended). °· You have nausea or vomiting. °· You have a fever. °· You have increasing abdominal pain that is not relieved with medicine. °Document Released: 02/04/2004 Document Revised: 04/12/2013 Document Reviewed: 02/27/2013 °[employer]® Patient Information ©2015 [employer]. This information is not intended to replace advice given to you by your health care provider. Make sure you discuss any questions you have with your health care provider. ° °Monitored Anesthesia Care °Monitored anesthesia care is an anesthesia service for a medical procedure. Anesthesia is the loss of the ability to feel pain. It is produced by medicines called anesthetics. It may affect a small area of your body (local anesthesia), a large area of your body (regional anesthesia), or your entire body (general anesthesia). The need for monitored anesthesia care depends your procedure, your condition, and the potential need for regional or general anesthesia. It is often provided during procedures where:  °· General anesthesia may be needed if there are complications. This is because you need special care when you are under general anesthesia.   °· You will be under local or regional anesthesia. This is so that you are able to have higher levels of anesthesia if needed.   °· You will receive calming medicines (sedatives). This is especially the case if sedatives are given to put you in a semi-conscious state of relaxation (deep sedation). This is because the amount of sedative needed to produce this state can be hard to predict. Too much of a sedative can produce general anesthesia. °Monitored anesthesia care is performed by one   or more health care providers who have special training in all types of anesthesia. You will need to meet with these health care providers before your procedure. During this meeting, they will ask you about your medical history. They will also give you instructions to follow. (For example, you will need to stop  eating and drinking before your procedure. You may also need to stop or change medicines you are taking.) During your procedure, your health care providers will stay with you. They will:  °· Watch your condition. This includes watching your blood pressure, breathing, and level of pain.   °· Diagnose and treat problems that occur.   °· Give medicines if they are needed. These may include calming medicines (sedatives) and anesthetics.   °· Make sure you are comfortable.   °Having monitored anesthesia care does not necessarily mean that you will be under anesthesia. It does mean that your health care providers will be able to manage anesthesia if you need it or if it occurs. It also means that you will be able to have a different type of anesthesia than you are having if you need it. When your procedure is complete, your health care providers will continue to watch your condition. They will make sure any medicines wear off before you are allowed to go home.  °Document Released: 03/18/2005 Document Revised: 11/06/2013 Document Reviewed: 08/03/2012 °Exit[employer]® Patient Information ©2015 [employer]. This information is not intended to replace advice given to you by your health care provider. Make sure you discuss any questions you have with your health care provider. ° ° °

## 2015-02-11 NOTE — Transfer of Care (Signed)
Immediate Anesthesia Transfer of Care Note  Patient: Damon Peck  Procedure(s) Performed: Procedure(s): COLONOSCOPY WITH PROPOFOL (N/A)  Patient Location: PACU  Anesthesia Type:MAC  Level of Consciousness:  sedated, patient cooperative and responds to stimulation  Airway & Oxygen Therapy:Patient Spontanous Breathing and Patient connected to face mask oxgen  Post-op Assessment:  Report given to PACU RN and Post -op Vital signs reviewed and stable  Post vital signs:  Reviewed and stable  Last Vitals:  Filed Vitals:   02/11/15 1212  BP: 152/82  Temp: 36.7 C  Resp: 16    Complications: No apparent anesthesia complications

## 2015-02-11 NOTE — Anesthesia Postprocedure Evaluation (Signed)
  Anesthesia Post-op Note  Patient: Damon Peck  Procedure(s) Performed: Procedure(s) (LRB): COLONOSCOPY WITH PROPOFOL (N/A)  Patient Location: PACU  Anesthesia Type: MAC  Level of Consciousness: awake and alert   Airway and Oxygen Therapy: Patient Spontanous Breathing  Post-op Pain: mild  Post-op Assessment: Post-op Vital signs reviewed, Patient's Cardiovascular Status Stable, Respiratory Function Stable, Patent Airway and No signs of Nausea or vomiting  Last Vitals:  Filed Vitals:   02/11/15 1340  BP: 133/78  Pulse: 79  Temp:   Resp: 17    Post-op Vital Signs: stable   Complications: No apparent anesthesia complications

## 2015-02-11 NOTE — H&P (Signed)
  Procedure: Baseline screening colonoscopy. No family history of colon cancer.  History: The patient is a 49 year old male born April 18, 1966. He is scheduled to undergo his first screening colonoscopy with polypectomy to prevent colon cancer.  Medication allergies: None  Past medical history: Hypertension. Hypercholesterolemia.  Exam: The patient is alert and lying comfortably on the endoscopy stretcher. Abdomen is soft and nontender to palpation. Cardiac exam reveals a regular rhythm. Lungs are clear to auscultation.  Plan: Proceed with baseline screening colonoscopy

## 2015-02-11 NOTE — Op Note (Signed)
Procedure: Baseline screening colonoscopy  Endoscopist: Danise Edge  Premedication: Propofol administered by anesthesia  Procedure: The patient was placed in the left lateral decubitus position. Anal inspection and digital rectal exam were normal. Pentax pediatric colonoscope was introduced into the rectum and advanced to the cecum. A normal-appearing ileocecal valve and appendiceal orifice were identified. Colonic preparation for the exam today was good. Withdrawal time was 14 minutes  Universal colonic diverticulosis without signs of diverticulitis was present  Rectum. Normal. Retroflexed view of the distal rectum was normal  Sigmoid colon and descending colon. Normal  Splenic flexure. Normal  Transverse colon. Normal  Hepatic flexure. Normal  Ascending colon. Normal  Cecum and ileocecal valve. Normal  Assessment: Normal screening colonoscopy  Recommendation: Schedule repeat screening colonoscopy in 10 years

## 2015-02-11 NOTE — Anesthesia Preprocedure Evaluation (Signed)
Anesthesia Evaluation  Patient identified by MRN, date of birth, ID band Patient awake    Reviewed: Allergy & Precautions, H&P , NPO status , Patient's Chart, lab work & pertinent test results  History of Anesthesia Complications Negative for: history of anesthetic complications  Airway Mallampati: II  TM Distance: >3 FB Neck ROM: full    Dental no notable dental hx.    Pulmonary neg pulmonary ROS,  breath sounds clear to auscultation  Pulmonary exam normal       Cardiovascular hypertension, Pt. on medications Normal cardiovascular examRhythm:regular Rate:Normal     Neuro/Psych negative neurological ROS     GI/Hepatic negative GI ROS, Neg liver ROS,   Endo/Other  negative endocrine ROS  Renal/GU negative Renal ROS     Musculoskeletal   Abdominal   Peds  Hematology negative hematology ROS (+)   Anesthesia Other Findings   Reproductive/Obstetrics negative OB ROS                             Anesthesia Physical Anesthesia Plan  ASA: II  Anesthesia Plan: MAC   Post-op Pain Management:    Induction: Intravenous  Airway Management Planned: Nasal Cannula  Additional Equipment:   Intra-op Plan:   Post-operative Plan:   Informed Consent: I have reviewed the patients History and Physical, chart, labs and discussed the procedure including the risks, benefits and alternatives for the proposed anesthesia with the patient or authorized representative who has indicated his/her understanding and acceptance.     Plan Discussed with: Anesthesiologist, CRNA and Surgeon  Anesthesia Plan Comments:         Anesthesia Quick Evaluation

## 2015-02-12 ENCOUNTER — Encounter (HOSPITAL_COMMUNITY): Payer: Self-pay | Admitting: Gastroenterology

## 2015-03-19 ENCOUNTER — Other Ambulatory Visit: Payer: Self-pay | Admitting: Internal Medicine

## 2015-05-16 ENCOUNTER — Encounter: Payer: Self-pay | Admitting: Internal Medicine

## 2015-05-16 ENCOUNTER — Ambulatory Visit: Payer: 59 | Attending: Internal Medicine | Admitting: Internal Medicine

## 2015-05-16 VITALS — BP 132/94 | HR 65 | Temp 98.1°F | Resp 20 | Ht 71.0 in | Wt 239.6 lb

## 2015-05-16 DIAGNOSIS — I10 Essential (primary) hypertension: Secondary | ICD-10-CM | POA: Diagnosis not present

## 2015-05-16 DIAGNOSIS — M545 Low back pain, unspecified: Secondary | ICD-10-CM

## 2015-05-16 DIAGNOSIS — E785 Hyperlipidemia, unspecified: Secondary | ICD-10-CM

## 2015-05-16 LAB — LIPID PANEL
Cholesterol: 151 mg/dL (ref 125–200)
HDL: 37 mg/dL — ABNORMAL LOW (ref >=40)
LDL Cholesterol: 94 mg/dL (ref <130)
TRIGLYCERIDES: 100 mg/dL (ref <150)
Total CHOL/HDL Ratio: 4.1 Ratio (ref <=5.0)
VLDL: 20 mg/dL (ref <30)

## 2015-05-16 LAB — COMPLETE METABOLIC PANEL WITH GFR
ALBUMIN: 4.6 g/dL (ref 3.6–5.1)
ALK PHOS: 52 U/L (ref 40–115)
ALT: 24 U/L (ref 9–46)
AST: 19 U/L (ref 10–35)
BILIRUBIN TOTAL: 0.5 mg/dL (ref 0.2–1.2)
BUN: 12 mg/dL (ref 7–25)
CALCIUM: 9.7 mg/dL (ref 8.6–10.3)
CO2: 30 mmol/L (ref 20–31)
Chloride: 98 mmol/L (ref 98–110)
Creat: 0.64 mg/dL — ABNORMAL LOW (ref 0.70–1.33)
GFR, Est African American: >89 mL/min (ref >=60)
GLUCOSE: 91 mg/dL (ref 65–99)
Potassium: 3.9 mmol/L (ref 3.5–5.3)
Sodium: 137 mmol/L (ref 135–146)
TOTAL PROTEIN: 7.6 g/dL (ref 6.1–8.1)

## 2015-05-16 LAB — POCT GLYCOSYLATED HEMOGLOBIN (HGB A1C): HEMOGLOBIN A1C: 5.5

## 2015-05-16 MED ORDER — SIMVASTATIN 10 MG PO TABS: 10.0000 mg | ORAL_TABLET | Freq: Every day | ORAL | Status: DC

## 2015-05-16 MED ORDER — AMLODIPINE BESYLATE 10 MG PO TABS: 10.0000 mg | ORAL_TABLET | Freq: Every day | ORAL | Status: DC

## 2015-05-16 MED ORDER — TRAMADOL HCL 50 MG PO TABS: 50.0000 mg | ORAL_TABLET | Freq: Two times a day (BID) | ORAL | Status: DC | PRN

## 2015-05-16 MED ORDER — METOPROLOL SUCCINATE ER 100 MG PO TB24: 100.0000 mg | ORAL_TABLET | Freq: Every day | ORAL | Status: DC

## 2015-05-16 MED ORDER — HYDROCHLOROTHIAZIDE 25 MG PO TABS: 25.0000 mg | ORAL_TABLET | Freq: Every day | ORAL | Status: DC

## 2015-05-16 NOTE — Progress Notes (Signed)
Patient ID: Damon Peck, male   DOB: Aug 14, 1965, 49 y.o.   MRN: [id number]   Damon Peck, is a 49 y.o. male  [id number]  [id number]  DOB - [birth year]  Chief Complaint  Patient presents with  . Follow-up        Subjective:   Damon Peck is a 49 y.o. male with history of hypertension and dyslipidemia here today for a follow up visit. Patient recently returned from a visit to his country, here for refill of medications. He has no new complaints. Recently had colonoscopy done, results reviewed and discussed with patient. Patient is compliant with his medications, he does not smoke cigarettes, does not drink alcohol. Patient has No headache, No chest pain, No abdominal pain - No Nausea, No new weakness tingling or numbness, No Cough - SOB.  No problems updated.  ALLERGIES: No Known Allergies  PAST MEDICAL HISTORY: Past Medical History  Diagnosis Date  . Hypertension     MEDICATIONS AT HOME: Prior to Admission medications   Medication Sig Start Date End Date Taking? Authorizing Provider  amLODipine (NORVASC) 10 MG tablet Take 1 tablet (10 mg total) by mouth daily. 05/16/15  Yes Quentin Angst, MD  hydrochlorothiazide (HYDRODIURIL) 25 MG tablet Take 1 tablet (25 mg total) by mouth daily. 05/16/15  Yes Quentin Angst, MD  metoprolol succinate (TOPROL-XL) 100 MG 24 hr tablet Take 1 tablet (100 mg total) by mouth daily. Take with or immediately following a meal. 05/16/15  Yes Angeli Demilio E Hyman Hopes, MD  nitroGLYCERIN (NITROGLYN) 2 % ointment Apply 0.5 inches topically 4 (four) times daily. 08/03/13  Yes Dorothea Ogle, MD  simvastatin (ZOCOR) 10 MG tablet Take 1 tablet (10 mg total) by mouth at bedtime. 05/16/15  Yes Rosalio Catterton Annitta Needs, MD  traMADol (ULTRAM) 50 MG tablet Take 1 tablet (50 mg total) by mouth every 12 (twelve) hours as needed. 05/16/15  Yes Quentin Angst, MD     Objective:   Filed Vitals:   05/16/15 1533  BP: 154/90  Pulse: 65  Temp: 98.1 F (36.7 C)    TempSrc: Oral  Resp: 20  Height:  (1.803 m)  Weight: 239 lb 9.6 oz (108.682 kg)  SpO2: 96%    Exam General appearance : Awake, alert, not in any distress. Speech Clear. Not toxic looking HEENT: Atraumatic and Normocephalic, pupils equally reactive to light and accomodation Neck: supple, no JVD. No cervical lymphadenopathy.  Chest:Good air entry bilaterally, no added sounds  CVS: S1 S2 regular, no murmurs.  Abdomen: Bowel sounds present, Non tender and not distended with no gaurding, rigidity or rebound. Extremities: B/L Lower Ext shows no edema, both legs are warm to touch Neurology: Awake alert, and oriented X 3, CN II-XII intact, Non focal Skin:No Rash  Data Review Lab Results  Component Value Date   HGBA1C 5.5 03/08/2014     Assessment & Plan   1. Essential hypertension: Repeat BP = 132/94 mm Hg  - COMPLETE METABOLIC PANEL WITH GFR - POCT glycosylated hemoglobin (Hb A1C) - Lipid panel - amLODipine (NORVASC) 10 MG tablet; Take 1 tablet (10 mg total) by mouth daily.  Dispense: 90 tablet; Refill: 3 - hydrochlorothiazide (HYDRODIURIL) 25 MG tablet; Take 1 tablet (25 mg total) by mouth daily.  Dispense: 90 tablet; Refill: 3 - metoprolol succinate (TOPROL-XL) 100 MG 24 hr tablet; Take 1 tablet (100 mg total) by mouth daily. Take with or immediately following a meal.  Dispense: 90 tablet; Refill: 3  We have discussed target  BP range and blood pressure goal. I have advised patient to check BP regularly and to call us back or report to clinic if the numbers are consistently higher than 140/90. We discussed the importance of compliance with medical therapy and DASH diet recommended, consequences of uncontrolled hypertension discussed.   - continue current BP medications  2. Dyslipidemia  - simvastatin (ZOCOR) 10 MG tablet; Take 1 tablet (10 mg total) by mouth at bedtime.  Dispense: 90 tablet; Refill: 3  To address this please limit saturated fat to no more than 7% of your  calories, limit cholesterol to 200 mg/day, increase fiber and exercise as tolerated. If needed we may add another cholesterol lowering medication to your regimen.   3. Midline low back pain without sciatica  - traMADol (ULTRAM) 50 MG tablet; Take 1 tablet (50 mg total) by mouth every 12 (twelve) hours as needed.  Dispense: 90 tablet; Refill: 0  Patient have been counseled extensively about nutrition and exercise  Return in about 3 months (around 08/16/2015) for Follow up HTN, Routine Follow Up, Dyslipidemia, Follow up Pain and comorbidities.  The patient was given clear instructions to go to ER or return to medical center if symptoms don't improve, worsen or new problems develop. The patient verbalized understanding. The patient was told to call to get lab results if they haven't heard anything in the next week.   This note has been created with Education officer, environmentalDragon speech recognition software and smart phrase technology. Any transcriptional errors are unintentional.    Jeanann LewandowskyJEGEDE, Tanisa Lagace, MD, MHA, FACP, FAAP, CPE [facility] [place], [place] [phone]   05/16/2015, 4:18 PM

## 2015-05-16 NOTE — Progress Notes (Signed)
Patient here for F/U   Patient requesting tramadol refill  Patient denies pain at this time  Recheck BP 163/95

## 2015-05-16 NOTE — Patient Instructions (Signed)
DASH Eating Plan DASH stands for "Dietary Approaches to Stop Hypertension." The DASH eating plan is a healthy eating plan that has been shown to reduce high blood pressure (hypertension). Additional health benefits may include reducing the risk of type 2 diabetes mellitus, heart disease, and stroke. The DASH eating plan may also help with weight loss. WHAT DO I NEED TO KNOW ABOUT THE DASH EATING PLAN? For the DASH eating plan, you will follow these general guidelines:  Choose foods with a percent daily value for sodium of less than 5% (as listed on the food label).  Use salt-free seasonings or herbs instead of table salt or sea salt.  Check with your health care provider or pharmacist before using salt substitutes.  Eat lower-sodium products, often labeled as "lower sodium" or "no salt added."  Eat fresh foods.  Eat more vegetables, fruits, and low-fat dairy products.  Choose whole grains. Look for the word "whole" as the first word in the ingredient list.  Choose fish and skinless chicken or turkey more often than red meat. Limit fish, poultry, and meat to 6 oz (170 g) each day.  Limit sweets, desserts, sugars, and sugary drinks.  Choose heart-healthy fats.  Limit cheese to 1 oz (28 g) per day.  Eat more home-cooked food and less restaurant, buffet, and fast food.  Limit fried foods.  Cook foods using methods other than frying.  Limit canned vegetables. If you do use them, rinse them well to decrease the sodium.  When eating at a restaurant, ask that your food be prepared with less salt, or no salt if possible. WHAT FOODS CAN I EAT? Seek help from a dietitian for individual calorie needs. Grains Whole grain or whole wheat bread. Brown rice. Whole grain or whole wheat pasta. Quinoa, bulgur, and whole grain cereals. Low-sodium cereals. Corn or whole wheat flour tortillas. Whole grain cornbread. Whole grain crackers. Low-sodium crackers. Vegetables Fresh or frozen vegetables  (raw, steamed, roasted, or grilled). Low-sodium or reduced-sodium tomato and vegetable juices. Low-sodium or reduced-sodium tomato sauce and paste. Low-sodium or reduced-sodium canned vegetables.  Fruits All fresh, canned (in natural juice), or frozen fruits. Meat and Other Protein Products Ground beef (85% or leaner), grass-fed beef, or beef trimmed of fat. Skinless chicken or turkey. Ground chicken or turkey. Pork trimmed of fat. All fish and seafood. Eggs. Dried beans, peas, or lentils. Unsalted nuts and seeds. Unsalted canned beans. Dairy Low-fat dairy products, such as skim or 1% milk, 2% or reduced-fat cheeses, low-fat ricotta or cottage cheese, or plain low-fat yogurt. Low-sodium or reduced-sodium cheeses. Fats and Oils Tub margarines without trans fats. Light or reduced-fat mayonnaise and salad dressings (reduced sodium). Avocado. Safflower, olive, or canola oils. Natural peanut or almond butter. Other Unsalted popcorn and pretzels. The items listed above may not be a complete list of recommended foods or beverages. Contact your dietitian for more options. WHAT FOODS ARE NOT RECOMMENDED? Grains White bread. White pasta. White rice. Refined cornbread. Bagels and croissants. Crackers that contain trans fat. Vegetables Creamed or fried vegetables. Vegetables in a cheese sauce. Regular canned vegetables. Regular canned tomato sauce and paste. Regular tomato and vegetable juices. Fruits Dried fruits. Canned fruit in light or heavy syrup. Fruit juice. Meat and Other Protein Products Fatty cuts of meat. Ribs, chicken wings, bacon, sausage, bologna, salami, chitterlings, fatback, hot dogs, bratwurst, and packaged luncheon meats. Salted nuts and seeds. Canned beans with salt. Dairy Whole or 2% milk, cream, half-and-half, and cream cheese. Whole-fat or sweetened yogurt. Full-fat   cheeses or blue cheese. Nondairy creamers and whipped toppings. Processed cheese, cheese spreads, or cheese  curds. Condiments Onion and garlic salt, seasoned salt, table salt, and sea salt. Canned and packaged gravies. Worcestershire sauce. Tartar sauce. Barbecue sauce. Teriyaki sauce. Soy sauce, including reduced sodium. Steak sauce. Fish sauce. Oyster sauce. Cocktail sauce. Horseradish. Ketchup and mustard. Meat flavorings and tenderizers. Bouillon cubes. Hot sauce. Tabasco sauce. Marinades. Taco seasonings. Relishes. Fats and Oils Butter, stick margarine, lard, shortening, ghee, and bacon fat. Coconut, palm kernel, or palm oils. Regular salad dressings. Other Pickles and olives. Salted popcorn and pretzels. The items listed above may not be a complete list of foods and beverages to avoid. Contact your dietitian for more information. WHERE CAN I FIND MORE INFORMATION? National Heart, Lung, and Blood Institute: www.nhlbi.nih.gov/health/health-topics/topics/dash/   This information is not intended to replace advice given to you by your health care provider. Make sure you discuss any questions you have with your health care provider.   Document Released: 06/11/2011 Document Revised: 07/13/2014 Document Reviewed: 04/26/2013 Elsevier Interactive Patient Education 2016 Elsevier Inc. Hypertension Hypertension, commonly called high blood pressure, is when the force of blood pumping through your arteries is too strong. Your arteries are the blood vessels that carry blood from your heart throughout your body. A blood pressure reading consists of a higher number over a lower number, such as 110/72. The higher number (systolic) is the pressure inside your arteries when your heart pumps. The lower number (diastolic) is the pressure inside your arteries when your heart relaxes. Ideally you want your blood pressure below 120/80. Hypertension forces your heart to work harder to pump blood. Your arteries may become narrow or stiff. Having untreated or uncontrolled hypertension can cause heart attack, stroke, kidney  disease, and other problems. RISK FACTORS Some risk factors for high blood pressure are controllable. Others are not.  Risk factors you cannot control include:   Race. You may be at higher risk if you are African American.  Age. Risk increases with age.  Gender. Men are at higher risk than women before age 45 years. After age 65, women are at higher risk than men. Risk factors you can control include:  Not getting enough exercise or physical activity.  Being overweight.  Getting too much fat, sugar, calories, or salt in your diet.  Drinking too much alcohol. SIGNS AND SYMPTOMS Hypertension does not usually cause signs or symptoms. Extremely high blood pressure (hypertensive crisis) may cause headache, anxiety, shortness of breath, and nosebleed. DIAGNOSIS To check if you have hypertension, your health care provider will measure your blood pressure while you are seated, with your arm held at the level of your heart. It should be measured at least twice using the same arm. Certain conditions can cause a difference in blood pressure between your right and left arms. A blood pressure reading that is higher than normal on one occasion does not mean that you need treatment. If it is not clear whether you have high blood pressure, you may be asked to return on a different day to have your blood pressure checked again. Or, you may be asked to monitor your blood pressure at home for 1 or more weeks. TREATMENT Treating high blood pressure includes making lifestyle changes and possibly taking medicine. Living a healthy lifestyle can help lower high blood pressure. You may need to change some of your habits. Lifestyle changes may include:  Following the DASH diet. This diet is high in fruits, vegetables, and whole grains.   It is low in salt, red meat, and added sugars.  Keep your sodium intake below 2,300 mg per day.  Getting at least 30-45 minutes of aerobic exercise at least 4 times per  week.  Losing weight if necessary.  Not smoking.  Limiting alcoholic beverages.  Learning ways to reduce stress. Your health care provider may prescribe medicine if lifestyle changes are not enough to get your blood pressure under control, and if one of the following is true:  You are 18-59 years of age and your systolic blood pressure is above 140.  You are 60 years of age or older, and your systolic blood pressure is above 150.  Your diastolic blood pressure is above 90.  You have diabetes, and your systolic blood pressure is over 140 or your diastolic blood pressure is over 90.  You have kidney disease and your blood pressure is above 140/90.  You have heart disease and your blood pressure is above 140/90. Your personal target blood pressure may vary depending on your medical conditions, your age, and other factors. HOME CARE INSTRUCTIONS  Have your blood pressure rechecked as directed by your health care provider.   Take medicines only as directed by your health care provider. Follow the directions carefully. Blood pressure medicines must be taken as prescribed. The medicine does not work as well when you skip doses. Skipping doses also puts you at risk for problems.  Do not smoke.   Monitor your blood pressure at home as directed by your health care provider. SEEK MEDICAL CARE IF:   You think you are having a reaction to medicines taken.  You have recurrent headaches or feel dizzy.  You have swelling in your ankles.  You have trouble with your vision. SEEK IMMEDIATE MEDICAL CARE IF:  You develop a severe headache or confusion.  You have unusual weakness, numbness, or feel faint.  You have severe chest or abdominal pain.  You vomit repeatedly.  You have trouble breathing. MAKE SURE YOU:   Understand these instructions.  Will watch your condition.  Will get help right away if you are not doing well or get worse.   This information is not intended to  replace advice given to you by your health care provider. Make sure you discuss any questions you have with your health care provider.   Document Released: 06/22/2005 Document Revised: 11/06/2014 Document Reviewed: 04/14/2013 Elsevier Interactive Patient Education 2016 Elsevier Inc. Dyslipidemia Dyslipidemia is an imbalance of the lipids in your blood. Lipids are waxy, fat-like proteins that your body needs in small amounts. Dyslipidemia often involves the lipids cholesterol or triglycerides. Common forms of dyslipidemia are:  High levels of bad cholesterol (LDL cholesterol). LDL cholesterol is the type of cholesterol that causes heart disease.  Low levels of good cholesterol (HDL cholesterol). HDL cholesterol is the type of cholesterol that helps protect against heart disease.  High levels of triglycerides. Triglycerides are a fatty substance in the blood linked to a buildup of plaque on your arteries. RISK FACTORS  Increased age.  Having a family history of high cholesterol.  Certain medicines, including birth control pills, diuretics, beta-blockers, and some medicines for depression.  Smoking.  Eating a high-fat diet.  Being overweight.  Medical conditions such as diabetes, polycystic ovary syndrome, pregnancy, kidney disease, and hypothyroidism.  Lack of regular exercise. SIGNS AND SYMPTOMS There are no signs or symptoms with dyslipidemia. DIAGNOSIS A simple blood test called a fasting blood test can be done to determine your level of:    Total cholesterol. This is the combined number of LDL cholesterol and HDL cholesterol. A healthy number is lower than 200.  LDL cholesterol. The goal number for LDL cholesterol is different for each person depending on risk factors. Ask your health care provider what your LDL cholesterol number should be.  HDL cholesterol. A healthy level of HDL cholesterol is 60 or higher. A number lower than 40 for men or 50 for women is a danger  sign.  Triglycerides. A healthy triglyceride number is less than 150. TREATMENT Dyslipidemia is a treatable condition. Your health care provider will advise you on what type of treatment is best based on your age, your test results, and current guidelines. Treatment may include:  Dietary changes. A dietitian may help you create a diet that is based on your risk factors, conditions, and lifestyle.  Regular exercise. This can help lower your LDL cholesterol, raise your HDL cholesterol, and help with weight management. Check with your health care provider before beginning an exercise program. Most people should participate in 30 minutes of brisk exercise 5 days a week.  Quitting smoking.  Medicines to lower LDL cholesterol and triglycerides.  If you have high levels of triglycerides, your health care provider may:  Have you stop drinking alcohol.  Have you restrict your fat intake.  Have you eliminate refined sugars from your diet.  Treat you for other conditions, such as underactive thyroid gland (hypothyroidism) and high blood sugar (hyperglycemia). Your health care provider will monitor your lipid levels with regular blood tests. HOME CARE INSTRUCTIONS  Eat a healthy diet. Follow any diet instructions if they were given to you by your health care provider.  Maintain a healthy weight.  Exercise regularly based on the recommendations of your health care provider.  Do not use any tobacco products, including cigarettes, chewing tobacco, or electronic cigarettes.  Take medicines only as directed by your health care provider.  Keep all follow-up visits as directed by your health care provider. SEEK MEDICAL CARE IF: You are having possible side effects from your medicines.   This information is not intended to replace advice given to you by your health care provider. Make sure you discuss any questions you have with your health care provider.   Document Released: 06/27/2013  Document Revised: 07/13/2014 Document Reviewed: 06/27/2013 Elsevier Interactive Patient Education 2016 Elsevier Inc.  

## 2015-05-22 ENCOUNTER — Telehealth: Payer: Self-pay | Admitting: *Deleted

## 2015-05-22 NOTE — Telephone Encounter (Signed)
Patient verified DOB Patient made aware of his lab results being normal. Patient had no further questions.

## 2015-05-22 NOTE — Telephone Encounter (Signed)
-----   Message from Quentin Angstlugbemiga E Jegede, MD sent at 05/22/2015  8:39 AM EST ----- Please inform patient that his laboratory results are normal.

## 2015-06-14 ENCOUNTER — Ambulatory Visit: Payer: 59 | Attending: Internal Medicine

## 2015-07-26 ENCOUNTER — Ambulatory Visit: Payer: Self-pay | Attending: Internal Medicine

## 2015-08-15 MED FILL — AMLODIPINE BESYLATE 10 MG T: 10 | 30 days supply | Qty: 30 | Fill #2

## 2015-08-15 MED FILL — HYDROCHLOROTHIAZIDE 25 MG T: 25 | 30 days supply | Qty: 30 | Fill #0

## 2015-08-15 MED FILL — SIMVASTATIN 10 MG TABLET: 10 | 30 days supply | Qty: 30 | Fill #0

## 2015-08-20 MED FILL — METOPROLOL SUCC ER 100 MG T: 100 | 30 days supply | Qty: 30 | Fill #0

## 2015-08-21 ENCOUNTER — Telehealth: Payer: Self-pay | Admitting: Internal Medicine

## 2015-08-21 ENCOUNTER — Other Ambulatory Visit: Payer: Self-pay | Admitting: *Deleted

## 2015-08-21 DIAGNOSIS — M545 Low back pain, unspecified: Secondary | ICD-10-CM

## 2015-08-21 MED ORDER — TRAMADOL HCL 50 MG PO TABS: 50.0000 mg | ORAL_TABLET | Freq: Two times a day (BID) | ORAL | Status: DC | PRN

## 2015-08-21 NOTE — Telephone Encounter (Signed)
Patient was last seen on 05/16/15. Patient is requesting a refill for tramadol. Refill was placed with no additional refills.

## 2015-08-21 NOTE — Telephone Encounter (Signed)
Pt. Is requesting a refill for tramadol....please follow up with patient  °

## 2015-08-26 ENCOUNTER — Other Ambulatory Visit: Payer: Self-pay | Admitting: Internal Medicine

## 2015-09-02 ENCOUNTER — Ambulatory Visit: Payer: Self-pay | Attending: Internal Medicine | Admitting: Internal Medicine

## 2015-09-02 ENCOUNTER — Encounter: Payer: Self-pay | Admitting: Internal Medicine

## 2015-09-02 VITALS — BP 136/84 | HR 72 | Temp 98.1°F | Resp 16 | Ht 71.0 in | Wt 246.0 lb

## 2015-09-02 DIAGNOSIS — M545 Low back pain, unspecified: Secondary | ICD-10-CM

## 2015-09-02 DIAGNOSIS — Z79899 Other long term (current) drug therapy: Secondary | ICD-10-CM | POA: Insufficient documentation

## 2015-09-02 DIAGNOSIS — I10 Essential (primary) hypertension: Secondary | ICD-10-CM | POA: Insufficient documentation

## 2015-09-02 DIAGNOSIS — E785 Hyperlipidemia, unspecified: Secondary | ICD-10-CM | POA: Insufficient documentation

## 2015-09-02 MED ORDER — CYCLOBENZAPRINE HCL 5 MG PO TABS: 5.0000 mg | ORAL_TABLET | Freq: Every day | ORAL | Status: DC

## 2015-09-02 MED ORDER — TRAMADOL HCL 50 MG PO TABS: 50.0000 mg | ORAL_TABLET | Freq: Two times a day (BID) | ORAL | Status: DC | PRN

## 2015-09-02 MED FILL — traMADol HCL 50 MG TABS: 50 | 30 days supply | Qty: 60 | Fill #0

## 2015-09-02 NOTE — Patient Instructions (Addendum)
Back Pain, Adult Back pain is very common in adults.The cause of back pain is rarely dangerous and the pain often gets better over time.The cause of your back pain may not be known. Some common causes of back pain include:  Strain of the muscles or ligaments supporting the spine.  Wear and tear (degeneration) of the spinal disks.  Arthritis.  Direct injury to the back. For many people, back pain may return. Since back pain is rarely dangerous, most people can learn to manage this condition on their own. HOME CARE INSTRUCTIONS Watch your back pain for any changes. The following actions may help to lessen any discomfort you are feeling:  Remain active. It is stressful on your back to sit or stand in one place for long periods of time. Do not sit, drive, or stand in one place for more than 30 minutes at a time. Take short walks on even surfaces as soon as you are able.Try to increase the length of time you walk each day.  Exercise regularly as directed by your health care provider. Exercise helps your back heal faster. It also helps avoid future injury by keeping your muscles strong and flexible.  Do not stay in bed.Resting more than 1-2 days can delay your recovery.  Pay attention to your body when you bend and lift. The most comfortable positions are those that put less stress on your recovering back. Always use proper lifting techniques, including:  Bending your knees.  Keeping the load close to your body.  Avoiding twisting.  Find a comfortable position to sleep. Use a firm mattress and lie on your side with your knees slightly bent. If you lie on your back, put a pillow under your knees.  Avoid feeling anxious or stressed.Stress increases muscle tension and can worsen back pain.It is important to recognize when you are anxious or stressed and learn ways to manage it, such as with exercise.  Take medicines only as directed by your health care provider. Over-the-counter  medicines to reduce pain and inflammation are often the most helpful.Your health care provider may prescribe muscle relaxant drugs.These medicines help dull your pain so you can more quickly return to your normal activities and healthy exercise.  Apply ice to the injured area:  Put ice in a plastic bag.  Place a towel between your skin and the bag.  Leave the ice on for 20 minutes, 2-3 times a day for the first 2-3 days. After that, ice and heat may be alternated to reduce pain and spasms.  Maintain a healthy weight. Excess weight puts extra stress on your back and makes it difficult to maintain good posture. SEEK MEDICAL CARE IF:  You have pain that is not relieved with rest or medicine.  You have increasing pain going down into the legs or buttocks.  You have pain that does not improve in one week.  You have night pain.  You lose weight.  You have a fever or chills. SEEK IMMEDIATE MEDICAL CARE IF:   You develop new bowel or bladder control problems.  You have unusual weakness or numbness in your arms or legs.  You develop nausea or vomiting.  You develop abdominal pain.  You feel faint.   This information is not intended to replace advice given to you by your health care provider. Make sure you discuss any questions you have with your health care provider.   Document Released: 06/22/2005 Document Revised: 07/13/2014 Document Reviewed: 10/24/2013 Elsevier Interactive Patient Education 2016 Elsevier   Inc[employer] DASH Eating Plan DASH stands for "Dietary Approaches to Stop Hypertension." The DASH eating plan is a healthy eating plan that has been shown to reduce high blood pressure (hypertension). Additional health benefits may include reducing the risk of type 2 diabetes mellitus, heart disease, and stroke. The DASH eating plan may also help with weight loss. WHAT DO I NEED TO KNOW ABOUT THE DASH EATING PLAN? For the DASH eating plan, you will follow these general  guidelines:  Choose foods with a percent daily value for sodium of less than 5% (as listed on the food label).  Use salt-free seasonings or herbs instead of table salt or sea salt.  Check with your health care provider or pharmacist before using salt substitutes.  Eat lower-sodium products, often labeled as "lower sodium" or "no salt added."  Eat fresh foods.  Eat more vegetables, fruits, and low-fat dairy products.  Choose whole grains. Look for the word "whole" as the first word in the ingredient list.  Choose fish and skinless chicken or turkey more often than red meat. Limit fish, poultry, and meat to 6 oz (170 g) each day.  Limit sweets, desserts, sugars, and sugary drinks.  Choose heart-healthy fats.  Limit cheese to 1 oz (28 g) per day.  Eat more home-cooked food and less restaurant, buffet, and fast food.  Limit fried foods.  Cook foods using methods other than frying.  Limit canned vegetables. If you do use them, rinse them well to decrease the sodium.  When eating at a restaurant, ask that your food be prepared with less salt, or no salt if possible. WHAT FOODS CAN I EAT? Seek help from a dietitian for individual calorie needs. Grains Whole grain or whole wheat bread. Brown rice. Whole grain or whole wheat pasta. Quinoa, bulgur, and whole grain cereals. Low-sodium cereals. Corn or whole wheat flour tortillas. Whole grain cornbread. Whole grain crackers. Low-sodium crackers. Vegetables Fresh or frozen vegetables (raw, steamed, roasted, or grilled). Low-sodium or reduced-sodium tomato and vegetable juices. Low-sodium or reduced-sodium tomato sauce and paste. Low-sodium or reduced-sodium canned vegetables.  Fruits All fresh, canned (in natural juice), or frozen fruits. Meat and Other Protein Products Ground beef (85% or leaner), grass-fed beef, or beef trimmed of fat. Skinless chicken or turkey. Ground chicken or turkey. Pork trimmed of fat. All fish and seafood.  Eggs. Dried beans, peas, or lentils. Unsalted nuts and seeds. Unsalted canned beans. Dairy Low-fat dairy products, such as skim or 1% milk, 2% or reduced-fat cheeses, low-fat ricotta or cottage cheese, or plain low-fat yogurt. Low-sodium or reduced-sodium cheeses. Fats and Oils Tub margarines without trans fats. Light or reduced-fat mayonnaise and salad dressings (reduced sodium). Avocado. Safflower, olive, or canola oils. Natural peanut or almond butter. Other Unsalted popcorn and pretzels. The items listed above may not be a complete list of recommended foods or beverages. Contact your dietitian for more options. WHAT FOODS ARE NOT RECOMMENDED? Grains White bread. White pasta. White rice. Refined cornbread. Bagels and croissants. Crackers that contain trans fat. Vegetables Creamed or fried vegetables. Vegetables in a cheese sauce. Regular canned vegetables. Regular canned tomato sauce and paste. Regular tomato and vegetable juices. Fruits Dried fruits. Canned fruit in light or heavy syrup. Fruit juice. Meat and Other Protein Products Fatty cuts of meat. Ribs, chicken wings, bacon, sausage, bologna, salami, chitterlings, fatback, hot dogs, bratwurst, and packaged luncheon meats. Salted nuts and seeds. Canned beans with salt. Dairy Whole or 2% milk, cream, half-and-half, and cream cheese. Whole-fat or sweetened   yogurt. Full-fat cheeses or blue cheese. Nondairy creamers and whipped toppings. Processed cheese, cheese spreads, or cheese curds. Condiments Onion and garlic salt, seasoned salt, table salt, and sea salt. Canned and packaged gravies. Worcestershire sauce. Tartar sauce. Barbecue sauce. Teriyaki sauce. Soy sauce, including reduced sodium. Steak sauce. Fish sauce. Oyster sauce. Cocktail sauce. Horseradish. Ketchup and mustard. Meat flavorings and tenderizers. Bouillon cubes. Hot sauce. Tabasco sauce. Marinades. Taco seasonings. Relishes. Fats and Oils Butter, stick margarine, lard,  shortening, ghee, and bacon fat. Coconut, palm kernel, or palm oils. Regular salad dressings. Other Pickles and olives. Salted popcorn and pretzels. The items listed above may not be a complete list of foods and beverages to avoid. Contact your dietitian for more information. WHERE CAN I FIND MORE INFORMATION? National Heart, Lung, and [employer] Institute: www.nhlbi.nih.gov/health/health-topics/topics/dash/   This information is not intended to replace advice given to you by your health care provider. Make sure you discuss any questions you have with your health care provider.   Document Released: 06/11/2011 Document Revised: 07/13/2014 Document Reviewed: 04/26/2013 Elsevier Interactive Patient Education 2016 Elsevier Inc[employer] Hypertension Hypertension, commonly called high blood pressure, is when the force of blood pumping through your arteries is too strong. Your arteries are the blood vessels that carry blood from your heart throughout your body. A blood pressure reading consists of a higher number over a lower number, such as 110/72. The higher number (systolic) is the pressure inside your arteries when your heart pumps. The lower number (diastolic) is the pressure inside your arteries when your heart relaxes. Ideally you want your blood pressure below 120/80. Hypertension forces your heart to work harder to pump blood. Your arteries may become narrow or stiff. Having untreated or uncontrolled hypertension can cause heart attack, stroke, kidney disease, and other problems. RISK FACTORS Some risk factors for high blood pressure are controllable. Others are not.  Risk factors you cannot control include:   Race. You may be at higher risk if you are African American.  Age. Risk increases with age.  Gender. Men are at higher risk than women before age 45 years. After age 65, women are at higher risk than men. Risk factors you can control include:  Not getting enough exercise or physical  activity.  Being overweight.  Getting too much fat, sugar, calories, or salt in your diet.  Drinking too much alcohol. SIGNS AND SYMPTOMS Hypertension does not usually cause signs or symptoms. Extremely high blood pressure (hypertensive crisis) may cause headache, anxiety, shortness of breath, and nosebleed. DIAGNOSIS To check if you have hypertension, your health care provider will measure your blood pressure while you are seated, with your arm held at the level of your heart. It should be measured at least twice using the same arm. Certain conditions can cause a difference in blood pressure between your right and left arms. A blood pressure reading that is higher than normal on one occasion does not mean that you need treatment. If it is not clear whether you have high blood pressure, you may be asked to return on a different day to have your blood pressure checked again. Or, you may be asked to monitor your blood pressure at home for 1 or more weeks. TREATMENT Treating high blood pressure includes making lifestyle changes and possibly taking medicine. Living a healthy lifestyle can help lower high blood pressure. You may need to change some of your habits. Lifestyle changes may include:  Following the DASH diet. This diet is high in fruits, vegetables, and   whole grains. It is low in salt, red meat, and added sugars.  Keep your sodium intake below 2,300 mg per day.  Getting at least 30-45 minutes of aerobic exercise at least 4 times per week.  Losing weight if necessary.  Not smoking.  Limiting alcoholic beverages.  Learning ways to reduce stress. Your health care provider may prescribe medicine if lifestyle changes are not enough to get your blood pressure under control, and if one of the following is true:  You are 18-59 years of age and your systolic blood pressure is above 140.  You are 60 years of age or older, and your systolic blood pressure is above 150.  Your diastolic  blood pressure is above 90.  You have diabetes, and your systolic blood pressure is over 140 or your diastolic blood pressure is over 90.  You have kidney disease and your blood pressure is above 140/90.  You have heart disease and your blood pressure is above 140/90. Your personal target blood pressure may vary depending on your medical conditions, your age, and other factors. HOME CARE INSTRUCTIONS  Have your blood pressure rechecked as directed by your health care provider.   Take medicines only as directed by your health care provider. Follow the directions carefully. Blood pressure medicines must be taken as prescribed. The medicine does not work as well when you skip doses. Skipping doses also puts you at risk for problems.  Do not smoke.   Monitor your blood pressure at home as directed by your health care provider. SEEK MEDICAL CARE IF:   You think you are having a reaction to medicines taken.  You have recurrent headaches or feel dizzy.  You have swelling in your ankles.  You have trouble with your vision. SEEK IMMEDIATE MEDICAL CARE IF:  You develop a severe headache or confusion.  You have unusual weakness, numbness, or feel faint.  You have severe chest or abdominal pain.  You vomit repeatedly.  You have trouble breathing. MAKE SURE YOU:   Understand these instructions.  Will watch your condition.  Will get help right away if you are not doing well or get worse.   This information is not intended to replace advice given to you by your health care provider. Make sure you discuss any questions you have with your health care provider.   Document Released: 06/22/2005 Document Revised: 11/06/2014 Document Reviewed: 04/14/2013 Elsevier Interactive Patient Education 2016 Elsevier Inc.  
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
## 2015-09-02 NOTE — Progress Notes (Signed)
Patient's here for f/up HTN, dyslipidemia  Patient c/o lower back pain on left side that radiates down his leg that's worse at night, rated 7-8/10 described as numbness, off and on x10 days. States it's less painful with walking.  Patient requesting refill Tramadol

## 2015-09-11 MED FILL — CYCLOBENZAPRINE 5 MG TABLET: 5 | 30 days supply | Qty: 30 | Fill #0

## 2015-09-16 MED FILL — HYDROCHLOROTHIAZIDE 25 MG T: 25 | 30 days supply | Qty: 30 | Fill #1

## 2015-09-16 MED FILL — METOPROLOL SUCC ER 100 MG T: 100 | 30 days supply | Qty: 30 | Fill #1

## 2015-10-03 MED FILL — SIMVASTATIN 10 MG TABLET: 10 | 30 days supply | Qty: 30 | Fill #1

## 2015-10-07 MED FILL — traMADol HCL 50 MG TABS: 50 | 15 days supply | Qty: 30 | Fill #1

## 2015-10-14 MED FILL — AMLODIPINE BESYLATE 10 MG T: 10 | 30 days supply | Qty: 30 | Fill #3

## 2015-10-15 MED FILL — METOPROLOL SUCC ER 100 MG T: 100 | 30 days supply | Qty: 30 | Fill #2

## 2015-10-21 ENCOUNTER — Telehealth: Payer: Self-pay | Admitting: Internal Medicine

## 2015-10-21 MED FILL — HYDROCHLOROTHIAZIDE 25 MG T: 25 | 30 days supply | Qty: 30 | Fill #2

## 2015-10-21 NOTE — Telephone Encounter (Signed)
Patient came in requesting a medication refill for tramadol. °Please follow up. ° °

## 2015-10-28 NOTE — Telephone Encounter (Signed)
Patient came into office requesting medication refill on traMADol (ULTRAM) 50 MG tablet, please f/u  ° °

## 2015-11-04 MED FILL — SIMVASTATIN 10 MG TABLET: 10 | 30 days supply | Qty: 30 | Fill #2

## 2015-11-04 MED FILL — CYCLOBENZAPRINE 5 MG TABLET: 5 | 30 days supply | Qty: 30 | Fill #1

## 2015-11-08 NOTE — Telephone Encounter (Signed)
Patient is following regarding request for tramadol

## 2015-11-11 ENCOUNTER — Other Ambulatory Visit: Payer: Self-pay | Admitting: Internal Medicine

## 2015-11-11 DIAGNOSIS — M545 Low back pain, unspecified: Secondary | ICD-10-CM

## 2015-11-11 MED ORDER — TRAMADOL HCL 50 MG PO TABS: 50.0000 mg | ORAL_TABLET | Freq: Two times a day (BID) | ORAL | Status: DC | PRN

## 2015-11-14 MED FILL — ?HYDROCHLOROTHIAZIDE 25 MG: 25 MG | 30 days supply | Qty: 30 | Fill #3

## 2015-11-14 MED FILL — METOPROLOL SUCC ER 100 MG T: 100 | 30 days supply | Qty: 30 | Fill #3

## 2015-11-18 MED FILL — AMLODIPINE BESYLATE 10 MG T: 10 | 30 days supply | Qty: 30 | Fill #4

## 2015-11-19 ENCOUNTER — Other Ambulatory Visit: Payer: Self-pay | Admitting: *Deleted

## 2015-11-19 MED FILL — traMADol HCL 50 MG TABS: 50 | 30 days supply | Qty: 60 | Fill #0

## 2015-11-19 NOTE — Telephone Encounter (Signed)
Request was filled on 11/11/15 

## 2015-11-28 ENCOUNTER — Ambulatory Visit: Payer: Self-pay | Attending: Family Medicine | Admitting: Family Medicine

## 2015-11-28 ENCOUNTER — Encounter: Payer: Self-pay | Admitting: Family Medicine

## 2015-11-28 VITALS — BP 130/88 | HR 74 | Temp 99.0°F | Resp 16 | Ht 71.0 in | Wt 245.0 lb

## 2015-11-28 DIAGNOSIS — Z79899 Other long term (current) drug therapy: Secondary | ICD-10-CM | POA: Insufficient documentation

## 2015-11-28 DIAGNOSIS — M545 Low back pain, unspecified: Secondary | ICD-10-CM

## 2015-11-28 DIAGNOSIS — E785 Hyperlipidemia, unspecified: Secondary | ICD-10-CM

## 2015-11-28 DIAGNOSIS — I10 Essential (primary) hypertension: Secondary | ICD-10-CM | POA: Insufficient documentation

## 2015-11-28 MED ORDER — AMLODIPINE BESYLATE 10 MG PO TABS: 10.0000 mg | ORAL_TABLET | Freq: Every day | ORAL | Status: DC

## 2015-11-28 MED ORDER — ASPIRIN 81 MG PO TABS: 81.0000 mg | ORAL_TABLET | Freq: Every day | ORAL | Status: DC

## 2015-11-28 MED ORDER — SIMVASTATIN 10 MG PO TABS: 10.0000 mg | ORAL_TABLET | Freq: Every day | ORAL | Status: DC

## 2015-11-28 MED ORDER — TRAMADOL HCL 50 MG PO TABS: 50.0000 mg | ORAL_TABLET | Freq: Two times a day (BID) | ORAL | Status: DC | PRN

## 2015-11-28 MED ORDER — CYCLOBENZAPRINE HCL 5 MG PO TABS: 5.0000 mg | ORAL_TABLET | Freq: Every day | ORAL | Status: DC

## 2015-11-28 MED ORDER — METOPROLOL SUCCINATE ER 100 MG PO TB24: 100.0000 mg | ORAL_TABLET | Freq: Every day | ORAL | Status: DC

## 2015-11-28 MED ORDER — HYDROCHLOROTHIAZIDE 25 MG PO TABS: 25.0000 mg | ORAL_TABLET | Freq: Every day | ORAL | Status: DC

## 2015-11-28 NOTE — Progress Notes (Signed)
F/U HTN  Taking medication as prescribed  No tobacco user  No pain today  No suicidal thought in the past two weeks

## 2015-11-28 NOTE — Patient Instructions (Signed)
Damon Peck was seen today for hypertension.  Diagnoses and all orders for this visit:  Essential hypertension -     hydrochlorothiazide (HYDRODIURIL) 25 MG tablet; Take 1 tablet (25 mg total) by mouth daily. -     amLODipine (NORVASC) 10 MG tablet; Take 1 tablet (10 mg total) by mouth daily. -     metoprolol succinate (TOPROL-XL) 100 MG 24 hr tablet; Take 1 tablet (100 mg total) by mouth daily. Take with or immediately following a meal.  Midline low back pain without sciatica -     traMADol (ULTRAM) 50 MG tablet; Take 1 tablet (50 mg total) by mouth every 12 (twelve) hours as needed. -     cyclobenzaprine (FLEXERIL) 5 MG tablet; Take 1 tablet (5 mg total) by mouth at bedtime.  Dyslipidemia -     simvastatin (ZOCOR) 10 MG tablet; Take 1 tablet (10 mg total) by mouth at bedtime.  Other orders -     aspirin 81 MG tablet; Take 1 tablet (81 mg total) by mouth daily.   F/u in 6 months for HTN and labs  Dr. Armen PickupFunches

## 2015-11-28 NOTE — Progress Notes (Signed)
Subjective:  Patient ID: Damon Peck, male    DOB: [birth year]  Age: 50 y.o. MRN: [id number]  CC: Hypertension   HPI Damon Peck presents for    1. CHRONIC HYPERTENSION for > 10 years   Disease Monitoring  Blood pressure range: 130s/70-80s at home   Chest pain: no   Dyspnea: no   Claudication: no   Medication compliance: yes  Medication Side Effects  Lightheadedness: no   Urinary frequency: no   Edema: no   Impotence: yes   Preventitive Healthcare:  Exercise: yes, 3 times per week    Diet Pattern: eating at home.   Salt Restriction: yes   2. Back pain: chronic. Comes and  Goes. Controlled with tramadol. Worse with certain activities like mowing the lawn.   Social History  Substance Use Topics  . Smoking status: Never Smoker   . Smokeless tobacco: Not on file  . Alcohol Use: No    Outpatient Prescriptions Prior to Visit  Medication Sig Dispense Refill  . amLODipine (NORVASC) 10 MG tablet Take 1 tablet (10 mg total) by mouth daily. 90 tablet 3  . cyclobenzaprine (FLEXERIL) 5 MG tablet Take 1 tablet (5 mg total) by mouth at bedtime. 30 tablet 1  . hydrochlorothiazide (HYDRODIURIL) 25 MG tablet Take 1 tablet (25 mg total) by mouth daily. 90 tablet 3  . metoprolol succinate (TOPROL-XL) 100 MG 24 hr tablet Take 1 tablet (100 mg total) by mouth daily. Take with or immediately following a meal. 90 tablet 3  . nitroGLYCERIN (NITROGLYN) 2 % ointment Apply 0.5 inches topically 4 (four) times daily. 30 g 2  . simvastatin (ZOCOR) 10 MG tablet Take 1 tablet (10 mg total) by mouth at bedtime. 90 tablet 3  . traMADol (ULTRAM) 50 MG tablet Take 1 tablet (50 mg total) by mouth every 12 (twelve) hours as needed. 90 tablet 0   No facility-administered medications prior to visit.    ROS Review of Systems  Constitutional: Negative for fever, chills, fatigue and unexpected weight change.  Eyes: Negative for visual disturbance.  Respiratory: Negative for cough and shortness of breath.     Cardiovascular: Negative for chest pain, palpitations and leg swelling.  Gastrointestinal: Negative for nausea, vomiting, abdominal pain, diarrhea, constipation and blood in stool.  Endocrine: Negative for polydipsia, polyphagia and polyuria.  Musculoskeletal: Positive for back pain and arthralgias. Negative for myalgias, gait problem and neck pain.  Skin: Negative for rash.  Allergic/Immunologic: Negative for immunocompromised state.  Hematological: Negative for adenopathy. Does not bruise/bleed easily.  Psychiatric/Behavioral: Negative for suicidal ideas, sleep disturbance and dysphoric mood. The patient is not nervous/anxious.     Objective:  BP 143/88 mmHg  Pulse 74  Temp(Src) 99 F (37.2 C) (Oral)  Resp 16  Ht  (1.803 m)  Wt 245 lb (111.131 kg)  BMI 34.19 kg/m2  SpO2 97%  BP/Weight 11/28/2015 09/02/2015 05/16/2015  Systolic BP 143 136 132  Diastolic BP 88 84 94  Wt. (Lbs) 245 246 239.6  BMI 34.19 34.33 33.43    Physical Exam  Constitutional: He appears well-developed and well-nourished. No distress.  HENT:  Head: Normocephalic and atraumatic.  Neck: Normal range of motion. Neck supple.  Cardiovascular: Normal rate, regular rhythm, normal heart sounds and intact distal pulses.   Pulmonary/Chest: Effort normal and breath sounds normal.  Musculoskeletal: He exhibits no edema.  Neurological: He is alert.  Skin: Skin is warm and dry. No rash noted. No erythema.  Psychiatric: He has a normal mood  and affect.     Assessment & Plan:   There are no diagnoses linked to this encounter. Damon Peck was seen today for hypertension.  Diagnoses and all orders for this visit:  Essential hypertension -     hydrochlorothiazide (HYDRODIURIL) 25 MG tablet; Take 1 tablet (25 mg total) by mouth daily. -     amLODipine (NORVASC) 10 MG tablet; Take 1 tablet (10 mg total) by mouth daily. -     metoprolol succinate (TOPROL-XL) 100 MG 24 hr tablet; Take 1 tablet (100 mg total) by mouth  daily. Take with or immediately following a meal.  Midline low back pain without sciatica -     Discontinue: traMADol (ULTRAM) 50 MG tablet; Take 1 tablet (50 mg total) by mouth every 12 (twelve) hours as needed. -     cyclobenzaprine (FLEXERIL) 5 MG tablet; Take 1 tablet (5 mg total) by mouth at bedtime. -     traMADol (ULTRAM) 50 MG tablet; Take 1 tablet (50 mg total) by mouth every 12 (twelve) hours as needed.  Dyslipidemia -     simvastatin (ZOCOR) 10 MG tablet; Take 1 tablet (10 mg total) by mouth at bedtime.  Other orders -     aspirin 81 MG tablet; Take 1 tablet (81 mg total) by mouth daily.   Meds ordered this encounter  Medications  . hydrochlorothiazide (HYDRODIURIL) 25 MG tablet    Sig: Take 1 tablet (25 mg total) by mouth daily.    Dispense:  90 tablet    Refill:  3  . amLODipine (NORVASC) 10 MG tablet    Sig: Take 1 tablet (10 mg total) by mouth daily.    Dispense:  90 tablet    Refill:  3  . metoprolol succinate (TOPROL-XL) 100 MG 24 hr tablet    Sig: Take 1 tablet (100 mg total) by mouth daily. Take with or immediately following a meal.    Dispense:  90 tablet    Refill:  3  . DISCONTD: traMADol (ULTRAM) 50 MG tablet    Sig: Take 1 tablet (50 mg total) by mouth every 12 (twelve) hours as needed.    Dispense:  90 tablet    Refill:  0  . cyclobenzaprine (FLEXERIL) 5 MG tablet    Sig: Take 1 tablet (5 mg total) by mouth at bedtime.    Dispense:  30 tablet    Refill:  1  . aspirin 81 MG tablet    Sig: Take 1 tablet (81 mg total) by mouth daily.    Dispense:  90 tablet    Refill:  3  . simvastatin (ZOCOR) 10 MG tablet    Sig: Take 1 tablet (10 mg total) by mouth at bedtime.    Dispense:  90 tablet    Refill:  3  . traMADol (ULTRAM) 50 MG tablet    Sig: Take 1 tablet (50 mg total) by mouth every 12 (twelve) hours as needed.    Dispense:  60 tablet    Refill:  2    Follow-up: No Follow-up on file.   Dessa PhiJosalyn Diezel Mazur MD

## 2015-11-29 NOTE — Assessment & Plan Note (Signed)
Well controlled continue current regimen.  

## 2015-11-29 NOTE — Assessment & Plan Note (Signed)
intermittent Refilled tramadol for prn use

## 2015-12-05 MED FILL — SIMVASTATIN 10 MG TABLET: 10 | 30 days supply | Qty: 30 | Fill #3

## 2015-12-16 MED FILL — AMLODIPINE BESYLATE 10 MG T: 10 | 30 days supply | Qty: 30 | Fill #5

## 2015-12-17 MED FILL — traMADol HCL 50 MG TABS: 50 | 15 days supply | Qty: 30 | Fill #1

## 2015-12-17 MED FILL — HYDROCHLOROTHIAZIDE 25 MG T: 25 | 30 days supply | Qty: 30 | Fill #4

## 2015-12-17 MED FILL — METOPROLOL SUCC ER 100 MG T: 100 | 30 days supply | Qty: 30 | Fill #4

## 2016-01-06 MED FILL — SIMVASTATIN 10 MG TABLET: 10 | 30 days supply | Qty: 30 | Fill #4

## 2016-01-06 MED FILL — traMADol HCL 50 MG TABS: 50 | 30 days supply | Qty: 60 | Fill #0

## 2016-01-14 MED FILL — METOPROLOL SUCC ER 100 MG T: 100 | 30 days supply | Qty: 30 | Fill #5

## 2016-01-14 MED FILL — AMLODIPINE BESYLATE 10 MG T: 10 | 30 days supply | Qty: 30 | Fill #6

## 2016-01-20 MED FILL — HYDROCHLOROTHIAZIDE 25 MG T: 25 | 30 days supply | Qty: 30 | Fill #5

## 2016-01-30 ENCOUNTER — Ambulatory Visit: Payer: Self-pay | Attending: Internal Medicine

## 2016-02-04 MED FILL — traMADol HCL 50 MG TABS: 50 | 30 days supply | Qty: 60 | Fill #1

## 2016-02-05 MED FILL — ?SIMVASTATIN 10 MG TABLET: 10 | 30 days supply | Qty: 30 | Fill #5

## 2016-02-14 MED FILL — HYDROCHLOROTHIAZIDE 25 MG T: 25 | 30 days supply | Qty: 30 | Fill #6

## 2016-02-14 MED FILL — METOPROLOL SUCC ER 100 MG T: 100 | 30 days supply | Qty: 30 | Fill #6

## 2016-02-18 MED FILL — ?AMLODIPINE BESYLATE 10 MG: 10 | 30 days supply | Qty: 30 | Fill #0

## 2016-03-05 MED FILL — traMADol HCL 50 MG TABS: 50 | 30 days supply | Qty: 60 | Fill #2

## 2016-03-10 MED FILL — ?SIMVASTATIN 10 MG TABLET: 10 | 30 days supply | Qty: 30 | Fill #6

## 2016-03-16 MED FILL — HYDROCHLOROTHIAZIDE 25 MG T: 25 | 30 days supply | Qty: 30 | Fill #7

## 2016-03-16 MED FILL — METOPROLOL SUCC ER 100 MG T: 100 | 30 days supply | Qty: 30 | Fill #7

## 2016-03-17 MED FILL — AMLODIPINE BESYLATE 10 MG T: 10 | 30 days supply | Qty: 30 | Fill #1

## 2016-04-01 ENCOUNTER — Other Ambulatory Visit: Payer: Self-pay | Admitting: Family Medicine

## 2016-04-01 DIAGNOSIS — M545 Low back pain, unspecified: Secondary | ICD-10-CM

## 2016-04-02 MED ORDER — TRAMADOL HCL 50 MG PO TABS: 50.0000 mg | ORAL_TABLET | Freq: Two times a day (BID) | ORAL | 2 refills | Status: DC | PRN

## 2016-04-02 NOTE — Telephone Encounter (Signed)
Tramadol ready for pick up Please inform patient  

## 2016-04-03 ENCOUNTER — Other Ambulatory Visit: Payer: Self-pay | Admitting: Family Medicine

## 2016-04-03 DIAGNOSIS — M545 Low back pain, unspecified: Secondary | ICD-10-CM

## 2016-04-03 MED FILL — traMADol HCL 50 MG TABS: 50 | 30 days supply | Qty: 60 | Fill #0

## 2016-04-03 NOTE — Telephone Encounter (Signed)
Pt was called and informed of script being ready for pick up.

## 2016-04-06 MED FILL — SIMVASTATIN 10 MG TABLET: 10 | 30 days supply | Qty: 30 | Fill #7

## 2016-04-14 MED FILL — METOPROLOL SUCC ER 100 MG T: 100 | 30 days supply | Qty: 30 | Fill #8

## 2016-04-14 MED FILL — HYDROCHLOROTHIAZIDE 25 MG T: 25 | 30 days supply | Qty: 30 | Fill #8

## 2016-04-17 MED FILL — AMLODIPINE BESYLATE 10 MG T: 10 | 30 days supply | Qty: 30 | Fill #2

## 2016-04-24 ENCOUNTER — Encounter: Payer: Self-pay | Admitting: Family Medicine

## 2016-04-24 ENCOUNTER — Ambulatory Visit: Payer: Self-pay | Attending: Family Medicine | Admitting: Family Medicine

## 2016-04-24 VITALS — BP 124/84 | HR 70 | Temp 98.0°F | Ht 71.0 in | Wt 246.6 lb

## 2016-04-24 DIAGNOSIS — R079 Chest pain, unspecified: Secondary | ICD-10-CM | POA: Insufficient documentation

## 2016-04-24 DIAGNOSIS — K219 Gastro-esophageal reflux disease without esophagitis: Secondary | ICD-10-CM

## 2016-04-24 DIAGNOSIS — E785 Hyperlipidemia, unspecified: Secondary | ICD-10-CM

## 2016-04-24 DIAGNOSIS — G8929 Other chronic pain: Secondary | ICD-10-CM

## 2016-04-24 DIAGNOSIS — M545 Low back pain, unspecified: Secondary | ICD-10-CM

## 2016-04-24 DIAGNOSIS — I10 Essential (primary) hypertension: Secondary | ICD-10-CM

## 2016-04-24 DIAGNOSIS — Z79899 Other long term (current) drug therapy: Secondary | ICD-10-CM | POA: Insufficient documentation

## 2016-04-24 DIAGNOSIS — M549 Dorsalgia, unspecified: Secondary | ICD-10-CM | POA: Insufficient documentation

## 2016-04-24 LAB — LIPID PANEL
CHOL/HDL RATIO: 3.9 ratio (ref <=5.0)
CHOLESTEROL: 147 mg/dL (ref 125–200)
HDL: 38 mg/dL — AB (ref >=40)
LDL Cholesterol: 76 mg/dL (ref <130)
Triglycerides: 164 mg/dL — ABNORMAL HIGH (ref <150)
VLDL: 33 mg/dL — ABNORMAL HIGH (ref <30)

## 2016-04-24 LAB — COMPLETE METABOLIC PANEL WITH GFR
ALBUMIN: 4.5 g/dL (ref 3.6–5.1)
ALT: 25 U/L (ref 9–46)
AST: 19 U/L (ref 10–35)
Alkaline Phosphatase: 46 U/L (ref 40–115)
BUN: 14 mg/dL (ref 7–25)
CALCIUM: 9.4 mg/dL (ref 8.6–10.3)
CO2: 30 mmol/L (ref 20–31)
Chloride: 100 mmol/L (ref 98–110)
Creat: 0.86 mg/dL (ref 0.70–1.33)
GFR, Est African American: >89 mL/min (ref >=60)
Glucose, Bld: 101 mg/dL — ABNORMAL HIGH (ref 65–99)
POTASSIUM: 3.9 mmol/L (ref 3.5–5.3)
Sodium: 140 mmol/L (ref 135–146)
Total Bilirubin: 0.3 mg/dL (ref 0.2–1.2)
Total Protein: 7.6 g/dL (ref 6.1–8.1)

## 2016-04-24 MED ORDER — RANITIDINE HCL 300 MG PO TABS: 300.0000 mg | ORAL_TABLET | Freq: Every evening | ORAL | 5 refills | Status: DC | PRN

## 2016-04-24 MED ORDER — AMLODIPINE BESYLATE 10 MG PO TABS: 10.0000 mg | ORAL_TABLET | Freq: Every day | ORAL | 11 refills | Status: DC

## 2016-04-24 MED ORDER — METOPROLOL SUCCINATE ER 100 MG PO TB24: 100.0000 mg | ORAL_TABLET | Freq: Every day | ORAL | 11 refills | Status: DC

## 2016-04-24 MED ORDER — SIMVASTATIN 10 MG PO TABS: 10.0000 mg | ORAL_TABLET | Freq: Every day | ORAL | 11 refills | Status: DC

## 2016-04-24 MED ORDER — HYDROCHLOROTHIAZIDE 25 MG PO TABS: 25.0000 mg | ORAL_TABLET | Freq: Every day | ORAL | 11 refills | Status: DC

## 2016-04-24 NOTE — Patient Instructions (Addendum)
Damon Peck was seen today for hypertension.  Diagnoses and all orders for this visit:  Gastroesophageal reflux disease, esophagitis presence not specified -     ranitidine (ZANTAC) 300 MG tablet; Take 1 tablet (300 mg total) by mouth at bedtime as needed for heartburn.  Essential hypertension -     amLODipine (NORVASC) 10 MG tablet; Take 1 tablet (10 mg total) by mouth daily. -     hydrochlorothiazide (HYDRODIURIL) 25 MG tablet; Take 1 tablet (25 mg total) by mouth daily. -     metoprolol succinate (TOPROL-XL) 100 MG 24 hr tablet; Take 1 tablet (100 mg total) by mouth daily. Take with or immediately following a meal. -     COMPLETE METABOLIC PANEL WITH GFR  Dyslipidemia -     simvastatin (ZOCOR) 10 MG tablet; Take 1 tablet (10 mg total) by mouth at bedtime. -     Lipid Panel   F/u in 3 months for HTN   Dr. Armen PickupFunches

## 2016-04-24 NOTE — Progress Notes (Signed)
Pt states when he sleeps on his left side he feel pain in chest.  Pt needs refill on all medications.  Pt declined flu shot.

## 2016-04-24 NOTE — Progress Notes (Signed)
Subjective:  Patient ID: Damon Peck, male    DOB: 12/12/1965  Age: 50 y.o. MRN: [id number]  CC: Hypertension   HPI Damon Peck presents for    1. CHRONIC HYPERTENSION for > 10 years   Disease Monitoring  Blood pressure range: 130s/70-80s at home   Chest pain: yes, intermittent L sided when lying on his left side to sleep. Pain is relieve with tums. No CP with exertion while working out at [employer].   Dyspnea: no   Claudication: no   Medication compliance: yes  Medication Side Effects  Lightheadedness: no   Urinary frequency: no   Edema: no   Impotence: yes   Preventitive Healthcare:  Exercise: yes, 3 times per week    Diet Pattern: eating at home.   Salt Restriction: yes   2. Back pain: chronic. Comes and  Goes. Controlled with tramadol. Worse with certain activities like mowing the lawn. Request tramadol refill, last months Rx had 2 refills.   Social History  Substance Use Topics  . Smoking status: Never Smoker  . Smokeless tobacco: Not on file  . Alcohol use No    Outpatient Medications Prior to Visit  Medication Sig Dispense Refill  . amLODipine (NORVASC) 10 MG tablet Take 1 tablet (10 mg total) by mouth daily. 90 tablet 3  . aspirin 81 MG tablet Take 1 tablet (81 mg total) by mouth daily. 90 tablet 3  . cyclobenzaprine (FLEXERIL) 5 MG tablet Take 1 tablet (5 mg total) by mouth at bedtime. 30 tablet 1  . hydrochlorothiazide (HYDRODIURIL) 25 MG tablet Take 1 tablet (25 mg total) by mouth daily. 90 tablet 3  . metoprolol succinate (TOPROL-XL) 100 MG 24 hr tablet Take 1 tablet (100 mg total) by mouth daily. Take with or immediately following a meal. 90 tablet 3  . nitroGLYCERIN (NITROGLYN) 2 % ointment Apply 0.5 inches topically 4 (four) times daily. 30 g 2  . simvastatin (ZOCOR) 10 MG tablet Take 1 tablet (10 mg total) by mouth at bedtime. 90 tablet 3  . traMADol (ULTRAM) 50 MG tablet Take 1 tablet (50 mg total) by mouth every 12 (twelve) hours as needed. 60 tablet 2    No facility-administered medications prior to visit.     ROS Review of Systems  Constitutional: Negative for chills, fatigue, fever and unexpected weight change.  Eyes: Negative for visual disturbance.  Respiratory: Negative for cough and shortness of breath.   Cardiovascular: Negative for chest pain, palpitations and leg swelling.  Gastrointestinal: Negative for abdominal pain, blood in stool, constipation, diarrhea, nausea and vomiting.  Endocrine: Negative for polydipsia, polyphagia and polyuria.  Musculoskeletal: Positive for arthralgias and back pain. Negative for gait problem, myalgias and neck pain.  Skin: Negative for rash.  Allergic/Immunologic: Negative for immunocompromised state.  Hematological: Negative for adenopathy. Does not bruise/bleed easily.  Psychiatric/Behavioral: Negative for dysphoric mood, sleep disturbance and suicidal ideas. The patient is not nervous/anxious.     Objective:  BP 124/84   Pulse 70   Temp 98 F (36.7 C) (Oral)   Ht 5\' 11"  (1.803 m)   Wt 246 lb 9.6 oz (111.9 kg)   SpO2 97%   BMI 34.39 kg/m   BP/Weight 04/24/2016 11/28/2015 09/02/2015  Systolic BP 124 130 136  Diastolic BP 84 88 84  Wt. (Lbs) 246.6 245 246  BMI 34.39 34.19 34.33    Physical Exam  Constitutional: He appears well-developed and well-nourished. No distress.  HENT:  Head: Normocephalic and atraumatic.  Neck: Normal  range of motion. Neck supple.  Cardiovascular: Normal rate, regular rhythm, normal heart sounds and intact distal pulses.   Pulmonary/Chest: Effort normal and breath sounds normal.  Musculoskeletal: He exhibits no edema.  Neurological: He is alert.  Skin: Skin is warm and dry. No rash noted. No erythema.  Psychiatric: He has a normal mood and affect.     Assessment & Plan:  [name] was seen today for hypertension.  Diagnoses and all orders for this visit:  Gastroesophageal reflux disease, esophagitis presence not specified -     ranitidine (ZANTAC)  300 MG tablet; Take 1 tablet (300 mg total) by mouth at bedtime as needed for heartburn.  Essential hypertension -     amLODipine (NORVASC) 10 MG tablet; Take 1 tablet (10 mg total) by mouth daily. -     hydrochlorothiazide (HYDRODIURIL) 25 MG tablet; Take 1 tablet (25 mg total) by mouth daily. -     metoprolol succinate (TOPROL-XL) 100 MG 24 hr tablet; Take 1 tablet (100 mg total) by mouth daily. Take with or immediately following a meal. -     COMPLETE METABOLIC PANEL WITH GFR  Dyslipidemia -     simvastatin (ZOCOR) 10 MG tablet; Take 1 tablet (10 mg total) by mouth at bedtime. -     Lipid Panel   There are no diagnoses linked to this encounter. [name] was seen today for hypertension.  Diagnoses and all orders for this visit:  Gastroesophageal reflux disease, esophagitis presence not specified -     ranitidine (ZANTAC) 300 MG tablet; Take 1 tablet (300 mg total) by mouth at bedtime as needed for heartburn.  Essential hypertension -     amLODipine (NORVASC) 10 MG tablet; Take 1 tablet (10 mg total) by mouth daily. -     hydrochlorothiazide (HYDRODIURIL) 25 MG tablet; Take 1 tablet (25 mg total) by mouth daily. -     metoprolol succinate (TOPROL-XL) 100 MG 24 hr tablet; Take 1 tablet (100 mg total) by mouth daily. Take with or immediately following a meal. -     COMPLETE METABOLIC PANEL WITH GFR  Dyslipidemia -     simvastatin (ZOCOR) 10 MG tablet; Take 1 tablet (10 mg total) by mouth at bedtime. -     Lipid Panel   No orders of the defined types were placed in this encounter.   Follow-up: Return in about 3 months (around 07/25/2016) for HTN and HLD .   Dessa Phi MD

## 2016-04-26 DIAGNOSIS — K219 Gastro-esophageal reflux disease without esophagitis: Secondary | ICD-10-CM | POA: Insufficient documentation

## 2016-04-26 NOTE — Assessment & Plan Note (Signed)
Improved Continue simvastatin

## 2016-04-26 NOTE — Assessment & Plan Note (Signed)
intermittent Refilled tramadol for prn use

## 2016-04-26 NOTE — Assessment & Plan Note (Signed)
Intermittent GERD symptoms Zantac prescribed

## 2016-04-26 NOTE — Assessment & Plan Note (Signed)
Well controlled continue current regimen.  

## 2016-04-29 ENCOUNTER — Telehealth: Payer: Self-pay

## 2016-04-29 NOTE — Telephone Encounter (Signed)
Pt was called on 10/25 and informed of lab results.

## 2016-05-05 MED FILL — traMADol HCL 50 MG TABS: 50 | 30 days supply | Qty: 60 | Fill #1

## 2016-05-11 ENCOUNTER — Other Ambulatory Visit: Payer: Self-pay | Admitting: Internal Medicine

## 2016-05-11 DIAGNOSIS — I10 Essential (primary) hypertension: Secondary | ICD-10-CM

## 2016-05-11 MED FILL — METOPROLOL SUCC ER 100 MG T: 100 | 30 days supply | Qty: 30 | Fill #0

## 2016-05-11 MED FILL — HYDROCHLOROTHIAZIDE 25 MG T: 25 | 30 days supply | Qty: 30 | Fill #0

## 2016-05-12 MED FILL — SIMVASTATIN 10 MG TABLET: 10 | 30 days supply | Qty: 30 | Fill #0

## 2016-05-12 MED FILL — AMLODIPINE BESYLATE 10 MG T: 10 | 30 days supply | Qty: 30 | Fill #3

## 2016-06-10 MED FILL — traMADol HCL 50 MG TABS: 50 | 30 days supply | Qty: 60 | Fill #2

## 2016-06-19 MED FILL — SIMVASTATIN 10 MG TABLET: 10 | 30 days supply | Qty: 30 | Fill #1

## 2016-06-19 MED FILL — METOPROLOL SUCC ER 100 MG T: 100 | 30 days supply | Qty: 30 | Fill #1

## 2016-06-19 MED FILL — HYDROCHLOROTHIAZIDE 25 MG T: 25 | 30 days supply | Qty: 30 | Fill #1

## 2016-06-19 MED FILL — AMLODIPINE BESYLATE 10 MG T: 10 | 30 days supply | Qty: 30 | Fill #4

## 2016-07-07 ENCOUNTER — Other Ambulatory Visit: Payer: Self-pay | Admitting: Family Medicine

## 2016-07-07 DIAGNOSIS — M545 Low back pain, unspecified: Secondary | ICD-10-CM

## 2016-07-07 NOTE — Telephone Encounter (Signed)
Please inform patient, tramadol ready for pick up

## 2016-07-08 NOTE — Telephone Encounter (Signed)
Pt was called and informed of script being ready for pick up.

## 2016-07-13 MED FILL — METOPROLOL SUCC ER 100 MG T: 100 | 30 days supply | Qty: 30 | Fill #2

## 2016-07-13 MED FILL — HYDROCHLOROTHIAZIDE 25 MG T: 25 | 30 days supply | Qty: 30 | Fill #2

## 2016-07-14 MED FILL — SIMVASTATIN 10 MG TABLET: 10 | 30 days supply | Qty: 30 | Fill #2

## 2016-07-14 MED FILL — AMLODIPINE BESYLATE 10 MG T: 10 | 30 days supply | Qty: 30 | Fill #5

## 2016-07-16 MED FILL — traMADol HCL 50 MG TABS: 50 | 30 days supply | Qty: 60 | Fill #0 | Status: TO

## 2016-08-06 ENCOUNTER — Ambulatory Visit: Payer: Self-pay | Attending: Family Medicine | Admitting: Family Medicine

## 2016-08-06 ENCOUNTER — Encounter: Payer: Self-pay | Admitting: Family Medicine

## 2016-08-06 VITALS — BP 130/82 | HR 72 | Temp 98.3°F | Ht 71.0 in | Wt 254.6 lb

## 2016-08-06 DIAGNOSIS — Z7982 Long term (current) use of aspirin: Secondary | ICD-10-CM | POA: Insufficient documentation

## 2016-08-06 DIAGNOSIS — Z79899 Other long term (current) drug therapy: Secondary | ICD-10-CM | POA: Insufficient documentation

## 2016-08-06 DIAGNOSIS — E669 Obesity, unspecified: Secondary | ICD-10-CM | POA: Insufficient documentation

## 2016-08-06 DIAGNOSIS — N529 Male erectile dysfunction, unspecified: Secondary | ICD-10-CM | POA: Insufficient documentation

## 2016-08-06 DIAGNOSIS — I10 Essential (primary) hypertension: Secondary | ICD-10-CM | POA: Insufficient documentation

## 2016-08-06 NOTE — Assessment & Plan Note (Signed)
Elevated BP in setting of weight gain Discussed weight loss strategies Continue current regimen

## 2016-08-06 NOTE — Patient Instructions (Signed)
Wisdom was seen today for hypertension.  Diagnoses and all orders for this visit:  Essential hypertension  Obesity (BMI 30-39.9)   Work on weight reduction Weight of 225 lbs will help reduce blood pressure and risk of heart disease  F/u in 4 months for HTN    Dr. Armen PickupFunches

## 2016-08-06 NOTE — Progress Notes (Signed)
Subjective:  Patient ID: Damon Peck, male    DOB: 1965/10/27  Age: 51 y.o. MRN: [id number]  CC: Hypertension   HPI Damon Peck presents for    1. CHRONIC HYPERTENSION for > 10 years   Disease Monitoring  Blood pressure range: 130s/70-80s at home   Chest pain: no   Dyspnea: no   Claudication: no   Medication compliance: yes  Medication Side Effects  Lightheadedness: no   Urinary frequency: no   Edema: no   Impotence: yes   Preventitive Healthcare:  Exercise: yes, 3 times per week    Diet Pattern: eating at home.   Salt Restriction: yes   2. Weight gain: he admits to overeating while visiting [place]. He plans to reduce his portions and continue exercise to lose weight.   Social History  Substance Use Topics  . Smoking status: Never Smoker  . Smokeless tobacco: Not on file  . Alcohol use No    Outpatient Medications Prior to Visit  Medication Sig Dispense Refill  . amLODipine (NORVASC) 10 MG tablet Take 1 tablet (10 mg total) by mouth daily. 30 tablet 11  . aspirin 81 MG tablet Take 1 tablet (81 mg total) by mouth daily. 90 tablet 3  . cyclobenzaprine (FLEXERIL) 5 MG tablet Take 1 tablet (5 mg total) by mouth at bedtime. 30 tablet 1  . hydrochlorothiazide (HYDRODIURIL) 25 MG tablet Take 1 tablet (25 mg total) by mouth daily. 30 tablet 11  . metoprolol succinate (TOPROL-XL) 100 MG 24 hr tablet Take 1 tablet (100 mg total) by mouth daily. Take with or immediately following a meal. 30 tablet 11  . nitroGLYCERIN (NITROGLYN) 2 % ointment Apply 0.5 inches topically 4 (four) times daily. 30 g 2  . ranitidine (ZANTAC) 300 MG tablet Take 1 tablet (300 mg total) by mouth at bedtime as needed for heartburn. 30 tablet 5  . simvastatin (ZOCOR) 10 MG tablet Take 1 tablet (10 mg total) by mouth at bedtime. 30 tablet 11  . traMADol (ULTRAM) 50 MG tablet TAKE 1 TABLET BY MOUTH EVERY 12 HOURS AS NEEDED 60 tablet 2   No facility-administered medications prior to visit.     ROS Review  of Systems  Constitutional: Negative for chills, fatigue, fever and unexpected weight change.  Eyes: Negative for visual disturbance.  Respiratory: Negative for cough and shortness of breath.   Cardiovascular: Negative for chest pain, palpitations and leg swelling.  Gastrointestinal: Negative for abdominal pain, blood in stool, constipation, diarrhea, nausea and vomiting.  Endocrine: Negative for polydipsia, polyphagia and polyuria.  Musculoskeletal: Positive for arthralgias and back pain. Negative for gait problem, myalgias and neck pain.  Skin: Negative for rash.  Allergic/Immunologic: Negative for immunocompromised state.  Hematological: Negative for adenopathy. Does not bruise/bleed easily.  Psychiatric/Behavioral: Negative for dysphoric mood, sleep disturbance and suicidal ideas. The patient is not nervous/anxious.     Objective:  BP (!) 146/89 (BP Location: Left Arm, Patient Position: Sitting, Cuff Size: Large)   Pulse 72   Temp 98.3 F (36.8 C) (Oral)   Ht 5\' 11"  (1.803 m)   Wt 254 lb 9.6 oz (115.5 kg)   SpO2 95%   BMI 35.51 kg/m   BP/Weight 08/06/2016 04/24/2016 11/28/2015  Systolic BP 146 124 130  Diastolic BP 89 84 88  Wt. (Lbs) 254.6 246.6 245  BMI 35.51 34.39 34.19    Physical Exam  Constitutional: He appears well-developed and well-nourished. No distress.  HENT:  Head: Normocephalic and atraumatic.  Neck: Normal range  of motion. Neck supple.  Cardiovascular: Normal rate, regular rhythm, normal heart sounds and intact distal pulses.   Pulmonary/Chest: Effort normal and breath sounds normal.  Musculoskeletal: He exhibits no edema.  Neurological: He is alert.  Skin: Skin is warm and dry. No rash noted. No erythema.  Psychiatric: He has a normal mood and affect.     Assessment & Plan:  Damon Peck was seen today for hypertension.  Diagnoses and all orders for this visit:  Essential hypertension  Obesity (BMI 30-39.9)   There are no diagnoses linked to this  encounter. There are no diagnoses linked to this encounter. No orders of the defined types were placed in this encounter.   Follow-up: Return in about 4 months (around 12/04/2016) for HTN .   Damon PhiJosalyn Chelcee Korpi MD

## 2016-08-11 MED FILL — AMLODIPINE BESYLATE 10 MG T: 10 | 30 days supply | Qty: 30 | Fill #6

## 2016-08-11 MED FILL — SIMVASTATIN 10 MG TABLET: 10 | 30 days supply | Qty: 30 | Fill #3

## 2016-08-13 MED FILL — METOPROLOL SUCC ER 100 MG T: 100 | 30 days supply | Qty: 30 | Fill #3

## 2016-08-13 MED FILL — HYDROCHLOROTHIAZIDE 25 MG T: 25 | 30 days supply | Qty: 30 | Fill #3

## 2016-08-14 MED FILL — traMADol HCL 50 MG TABS: 50 | 30 days supply | Qty: 60 | Fill #0

## 2016-09-09 MED FILL — METOPROLOL SUCC ER 100 MG T: 100 | 30 days supply | Qty: 30 | Fill #4

## 2016-09-09 MED FILL — AMLODIPINE BESYLATE 10 MG T: 10 | 30 days supply | Qty: 30 | Fill #7

## 2016-09-09 MED FILL — HYDROCHLOROTHIAZIDE 25 MG T: 25 | 30 days supply | Qty: 30 | Fill #4

## 2016-09-09 MED FILL — SIMVASTATIN 10 MG TABLET: 10 | 30 days supply | Qty: 30 | Fill #4

## 2016-09-11 MED FILL — traMADol HCL 50 MG TABS: 50 | 30 days supply | Qty: 60 | Fill #1

## 2016-09-30 ENCOUNTER — Ambulatory Visit: Payer: Self-pay | Attending: Family Medicine

## 2016-10-12 MED FILL — SIMVASTATIN 10 MG TABLET: 10 | 30 days supply | Qty: 30 | Fill #5

## 2016-10-12 MED FILL — AMLODIPINE BESYLATE 10 MG T: 10 | 30 days supply | Qty: 30 | Fill #8

## 2016-10-12 MED FILL — ?METOPROLOL SUCC ER 100 MG: 100 | 30 days supply | Qty: 30 | Fill #5

## 2016-10-12 MED FILL — HYDROCHLOROTHIAZIDE 25 MG T: 25 | 30 days supply | Qty: 30 | Fill #5

## 2016-10-19 ENCOUNTER — Other Ambulatory Visit: Payer: Self-pay | Admitting: Family Medicine

## 2016-10-19 DIAGNOSIS — M545 Low back pain: Secondary | ICD-10-CM

## 2016-10-19 DIAGNOSIS — Z0000 Encounter for general adult medical examination without abnormal findings: Secondary | ICD-10-CM

## 2016-10-19 DIAGNOSIS — G8929 Other chronic pain: Secondary | ICD-10-CM

## 2016-10-19 MED ORDER — TRAMADOL HCL 50 MG PO TABS: 50.0000 mg | ORAL_TABLET | Freq: Two times a day (BID) | ORAL | 2 refills | Status: DC | PRN

## 2016-10-21 MED FILL — traMADol HCL 50 MG TABS: 50 | 30 days supply | Qty: 60 | Fill #0

## 2016-11-09 MED FILL — ?METOPROLOL SUCC ER 100 MG: 100 | 30 days supply | Qty: 30 | Fill #6

## 2016-11-09 MED FILL — SIMVASTATIN 10 MG TABLET: 10 | 30 days supply | Qty: 30 | Fill #6

## 2016-11-09 MED FILL — HYDROCHLOROTHIAZIDE 25 MG T: 25 | 30 days supply | Qty: 30 | Fill #6

## 2016-11-13 ENCOUNTER — Ambulatory Visit: Payer: Self-pay | Attending: Family Medicine | Admitting: Physician Assistant

## 2016-11-13 VITALS — BP 155/98 | HR 72 | Temp 98.5°F | Resp 16 | Wt 252.8 lb

## 2016-11-13 DIAGNOSIS — M67462 Ganglion, left knee: Secondary | ICD-10-CM | POA: Insufficient documentation

## 2016-11-13 DIAGNOSIS — J302 Other seasonal allergic rhinitis: Secondary | ICD-10-CM | POA: Insufficient documentation

## 2016-11-13 DIAGNOSIS — Z7982 Long term (current) use of aspirin: Secondary | ICD-10-CM | POA: Insufficient documentation

## 2016-11-13 DIAGNOSIS — M25562 Pain in left knee: Secondary | ICD-10-CM | POA: Insufficient documentation

## 2016-11-13 DIAGNOSIS — I10 Essential (primary) hypertension: Secondary | ICD-10-CM | POA: Insufficient documentation

## 2016-11-13 MED ORDER — FLUTICASONE PROPIONATE 50 MCG/ACT NA SUSP: 2.0000 | Freq: Every day | NASAL | 6 refills | Status: DC

## 2016-11-13 MED FILL — FLUTICASONE PROP 50 MCG SPR: 50 | 30 days supply | Qty: 16 | Fill #0

## 2016-11-13 NOTE — Progress Notes (Signed)
Damon Peck, is a 51 y.o. male  ZOX:[id number]  WJX:[id number]  DOB - 06-23-1966  Subjective:  Chief Complaint and HPI: Damon Peck is a 51 y.o. male here today for 3 week h/o L knee pain and what feels like a growth on his L knee.  It is made worse by exercising.  NKI.  No f/c.  Tramadol helps pain.    He usu takes BP meds daily but skipped them accidentally last night.  BP at home usu ~130/70s.  No HA/CP/SOB/dizziness.  Also having seasonal allergies with sneezing and itchy, watery eyes  ROS:   Constitutional:  No f/c, No night sweats, No unexplained weight loss. EENT:  No vision changes, No blurry vision, No hearing changes. No mouth, throat, or ear problems. +sneezing Respiratory: No cough, No SOB Cardiac: No CP, no palpitations GI:  No abd pain, No N/V/D. GU: No Urinary s/sx Musculoskeletal: +L knee pain Neuro: No headache, no dizziness, no motor weakness.  Skin: No rash Endocrine:  No polydipsia. No polyuria.  Psych: Denies SI/HI  No problems updated.  ALLERGIES: No Known Allergies  PAST MEDICAL HISTORY: Past Medical History:  Diagnosis Date  . Hypertension     MEDICATIONS AT HOME: Prior to Admission medications   Medication Sig Start Date End Date Taking? Authorizing Provider  amLODipine (NORVASC) 10 MG tablet Take 1 tablet (10 mg total) by mouth daily. 04/24/16   Dessa Phi, MD  aspirin 81 MG tablet Take 1 tablet (81 mg total) by mouth daily. 11/28/15   Funches, Gerilyn Nestle, MD  cyclobenzaprine (FLEXERIL) 5 MG tablet Take 1 tablet (5 mg total) by mouth at bedtime. 11/28/15   Funches, Gerilyn Nestle, MD  fluticasone (FLONASE) 50 MCG/ACT nasal spray Place 2 sprays into both nostrils daily. 11/13/16   Anders Simmonds, PA-C  hydrochlorothiazide (HYDRODIURIL) 25 MG tablet Take 1 tablet (25 mg total) by mouth daily. 04/24/16   Funches, Gerilyn Nestle, MD  metoprolol succinate (TOPROL-XL) 100 MG 24 hr tablet Take 1 tablet (100 mg total) by mouth daily. Take with or immediately  following a meal. 04/24/16   Funches, Josalyn, MD  nitroGLYCERIN (NITROGLYN) 2 % ointment Apply 0.5 inches topically 4 (four) times daily. 08/03/13   Dorothea Ogle, MD  simvastatin (ZOCOR) 10 MG tablet Take 1 tablet (10 mg total) by mouth at bedtime. 04/24/16   Funches, Gerilyn Nestle, MD  traMADol (ULTRAM) 50 MG tablet Take 1 tablet (50 mg total) by mouth every 12 (twelve) hours as needed. 10/19/16   Funches, Gerilyn Nestle, MD     Objective:  EXAM:   Vitals:   11/13/16 1538  BP: (!) 155/98  Pulse: 72  Resp: 16  Temp: 98.5 F (36.9 C)  TempSrc: Oral  SpO2: 95%  Weight: 252 lb 12.8 oz (114.7 kg)    General appearance : A&OX3. NAD. Non-toxic-appearing HEENT: Atraumatic and Normocephalic.  PERRLA. EOM intact.  TM clear B. Mouth-MMM, post pharynx WNL w/o erythema, No PND.  Turbinates enlarged, pale, and boggy.   Neck: supple, no JVD. No cervical lymphadenopathy. No thyromegaly Chest/Lungs:  Breathing-non-labored, Good air entry bilaterally, breath sounds normal without rales, rhonchi, or wheezing  CVS: S1 S2 regular, no murmurs, gallops, rubs  Extremities: Bilateral Lower Ext shows no edema, both legs are warm to touch with = pulse throughout.  L knee-skin is warm, dry, and intact without erythema.  Normal flexion and extension.  There is a 3 cm fluctuant mass at the inferior/lateral aspect of the patella that is consistent with a ganglion type cyst.  The ligaments/meniscus are stable wihout laxity.   Neurology:  CN II-XII grossly intact, Non focal.   Psych:  TP linear. J/I WNL. Normal speech. Appropriate eye contact and affect.  Skin:  No Rash  Data Review Lab Results  Component Value Date   HGBA1C 5.50 05/16/2015   HGBA1C 5.5 03/08/2014     Assessment & Plan   1. Essential hypertension Elevated today but he didn't taker his medications last night.  He has been checking his BP at home and getting ~130/70s.    2. Acute pain of left knee ? Ganglion cyst Relative rest.  Advil or  tylenol-tramadol additionally only if needed(he has a prescription-advised to use sparingly) - Ambulatory referral to Orthopedic Surgery  3. Seasonal allergic rhinitis, unspecified trigger - fluticasone (FLONASE) 50 MCG/ACT nasal spray; Place 2 sprays into both nostrils daily.  Dispense: 16 g; Refill: 6  Patient have been counseled extensively about nutrition and exercise  Return in about 2 months (around 01/13/2017) for assign new PCP and f/up htn.  The patient was given clear instructions to go to ER or return to medical center if symptoms don't improve, worsen or new problems develop. The patient verbalized understanding. The patient was told to call to get lab results if they haven't heard anything in the next week.   Georgian CoAngela Kadija Cruzen, PA-C [facility] [place], [place] [phone]   11/13/2016, 3:59 PMPatient ID: Damon Peck, male   DOB: 08/02/1965, 51 y.o.   MRN: [id number]

## 2016-11-18 ENCOUNTER — Encounter: Payer: Self-pay | Admitting: Family Medicine

## 2016-11-19 MED FILL — traMADol HCL 50 MG TABS: 50 | 30 days supply | Qty: 60 | Fill #1

## 2016-11-27 MED FILL — AMLODIPINE BESYLATE 10 MG T: 10 | 30 days supply | Qty: 30 | Fill #0

## 2016-12-09 ENCOUNTER — Encounter: Payer: Self-pay | Admitting: Student

## 2016-12-09 ENCOUNTER — Ambulatory Visit (INDEPENDENT_AMBULATORY_CARE_PROVIDER_SITE_OTHER): Payer: Self-pay | Admitting: Student

## 2016-12-09 VITALS — BP 130/80 | Ht 69.0 in | Wt 241.0 lb

## 2016-12-09 DIAGNOSIS — M25562 Pain in left knee: Secondary | ICD-10-CM

## 2016-12-09 DIAGNOSIS — R2242 Localized swelling, mass and lump, left lower limb: Secondary | ICD-10-CM | POA: Insufficient documentation

## 2016-12-09 DIAGNOSIS — M25862 Other specified joint disorders, left knee: Secondary | ICD-10-CM

## 2016-12-09 NOTE — Assessment & Plan Note (Signed)
Ultrasound shows increased Doppler flow to the area. Will need to characterize further with an MRI with and without contrast.  Need to further assess lesion. He'll follow-up after this is completed. Would not recommend needle drainage with the amount of blood flow seen.

## 2016-12-09 NOTE — Progress Notes (Signed)
  Damon Peck - 51 y.o. male MRN [id number]  Date of birth: 10/05/1965  SUBJECTIVE:  Including CC & ROS.  CC: left knee pain  Presents with a left knee mass that started about 6 weeks ago. He reports that there is no injury that occurred to the area. He also reports that it is not painful except when he is fully bending his knee and in full flexion. Does not hurt when he is active on his knees. He has not noticed any other swelling. He reports that it is not changed in size.  He has never had any knee problems and denies any problems with his right knee.    ROS: No unexpected weight loss, fever, chills, swelling, instability, muscle pain, numbness/tingling, redness, otherwise see HPI   PMHx - Updated and reviewed.  Contributory factors include: HTN, obesity, GERD PSHx - Updated and reviewed.  Contributory factors include:  Negative FHx - Updated and reviewed.  Contributory factors include:  Negative Social Hx - Updated and reviewed. Contributory factors include: Negative Medications - reviewed   DATA REVIEWED: PCP office visit- left knee mass  PHYSICAL EXAM:  VS: BP:130/80  HR: bpm  TEMP: ( )  RESP:   HT:5\' 9"  (175.3 cm)   WT:241 lb (109.3 kg)  BMI:35.7 PHYSICAL EXAM: Gen: NAD, alert, cooperative with exam, well-appearing HEENT: clear conjunctiva,  CV:  no edema, capillary refill brisk, normal rate Resp: non-labored Skin: no rashes, normal turgor  Neuro: no gross deficits.  Psych:  alert and oriented  Knee: Inspection shows a soft tissue mass on the inferior lateral aspect of his anterior knee. Palpation normal with no warmth, joint line tenderness, patellar tenderness, or condyle tenderness.  No tenderness to palpation over mass, appears cystic like to palpation ROM full in flexion and extension and lower leg rotation. Ligaments with solid consistent endpoints including ACL, PCL, LCL, MCL. Negative Mcmurray's, Apley's, and Thessalonian tests. Non painful patellar  compression. Patellar glide without crepitus. Patellar and quadriceps tendons unremarkable. Hamstring and quadriceps strength is normal.   Limited ultrasound of left knee:  Suprapatellar pouch without effusion Fluid-filled mass seen inferior and lateral to the patella Doppler flow shows increased blood flow to the area and blood flow through the mass.  Findings consistent with either a cystic or hematogenous mass on the anterior aspect of the knee  Ultrasound performed and interpreted by Cardell PeachAlicia Antonae Zbikowski, DO   ASSESSMENT & PLAN:   Knee mass, left Ultrasound shows increased Doppler flow to the area. Will need to characterize further with an MRI with and without contrast.  Need to further assess lesion. He'll follow-up after this is completed. Would not recommend needle drainage with the amount of blood flow seen.

## 2016-12-14 MED FILL — SIMVASTATIN 10 MG TABLET: 10 | 30 days supply | Qty: 30 | Fill #7

## 2016-12-14 MED FILL — HYDROCHLOROTHIAZIDE 25 MG T: 25 | 30 days supply | Qty: 30 | Fill #7

## 2016-12-14 MED FILL — ?METOPROLOL SUCC ER 100 MG: 100 | 30 days supply | Qty: 30 | Fill #7

## 2016-12-21 MED FILL — traMADol HCL 50 MG TABS: 50 | 30 days supply | Qty: 60 | Fill #2

## 2016-12-22 ENCOUNTER — Ambulatory Visit
Admission: RE | Admit: 2016-12-22 | Discharge: 2016-12-22 | Disposition: A | Discharge status: Home / Self Care | Payer: No Typology Code available for payment source | Source: Ambulatory Visit | Attending: Sports Medicine | Admitting: Sports Medicine

## 2016-12-22 DIAGNOSIS — M25562 Pain in left knee: Secondary | ICD-10-CM

## 2016-12-23 ENCOUNTER — Ambulatory Visit: Payer: Self-pay | Admitting: Student

## 2016-12-24 MED FILL — SIMVASTATIN 10 MG TABLET: 10 | 90 days supply | Qty: 90 | Fill #8

## 2016-12-24 MED FILL — AMLODIPINE BESYLATE 10 MG T: 10 | 90 days supply | Qty: 90 | Fill #1

## 2016-12-24 MED FILL — HYDROCHLOROTHIAZIDE 25 MG T: 25 | 90 days supply | Qty: 90 | Fill #8

## 2016-12-24 MED FILL — FLUTICASONE PROP 50 MCG SPR: 50 | 90 days supply | Qty: 48 | Fill #1

## 2016-12-24 MED FILL — METOPROLOL SUCC ER 100 MG T: 100 | 90 days supply | Qty: 90 | Fill #8

## 2016-12-30 ENCOUNTER — Ambulatory Visit (INDEPENDENT_AMBULATORY_CARE_PROVIDER_SITE_OTHER): Payer: Self-pay | Admitting: Student

## 2016-12-30 ENCOUNTER — Encounter: Payer: Self-pay | Admitting: Student

## 2016-12-30 ENCOUNTER — Ambulatory Visit: Payer: Self-pay

## 2016-12-30 VITALS — BP 124/80 | Ht 69.0 in | Wt 241.0 lb

## 2016-12-30 DIAGNOSIS — M7631 Iliotibial band syndrome, right leg: Secondary | ICD-10-CM

## 2016-12-30 DIAGNOSIS — M25862 Other specified joint disorders, left knee: Secondary | ICD-10-CM

## 2016-12-30 DIAGNOSIS — R2242 Localized swelling, mass and lump, left lower limb: Secondary | ICD-10-CM

## 2016-12-30 MED ORDER — NAPROXEN 500 MG PO TABS: 500.0000 mg | ORAL_TABLET | Freq: Two times a day (BID) | ORAL | 1 refills | Status: DC | PRN

## 2016-12-30 NOTE — Assessment & Plan Note (Signed)
We'll continue to monitor with either serial ultrasounds her serial MRI with contrast. Would like to make sure this does not grow. Offered injection to try to decrease the size. Has a lot of blood flow to it so would question if would be safe to inject.

## 2016-12-30 NOTE — Progress Notes (Signed)
Damon Peck - 51 y.o. male MRN [id number]  Date of birth: 1966-06-02  SUBJECTIVE:  Including CC & ROS.  CC: Left knee follow-up and right hip pain  Presents in follow-up of his left knee. He had been seen a few weeks ago for a mass persisted in his left knee. It was seen on ultrasound it seemed to be a cyst with some increased blood flow surrounding.  He reports that it does not bother him much except when he is bending his knee.  He also reports right hip pain. It is worse when he is trying to put on pants or get out of the car. He points to his lateral hip. Denies any numbness or tingling distally. Denies any groin pain. Conservative bothering him a month or so ago.   ROS: No unexpected weight loss, fever, chills, swelling, instability, muscle pain, numbness/tingling, redness, otherwise see HPI   PMHx - Updated and reviewed.  Contributory factors include: Hypertension, GERD, hyperlipidemia, obesity PSHx - Updated and reviewed.  Contributory factors include:  Negative FHx - Updated and reviewed.  Contributory factors include:  Negative Social Hx - Updated and reviewed. Contributory factors include: Negative Medications - reviewed   DATA REVIEWED: MRI left knee: Shows a large ganglion cyst that is complex and degenerative meniscal tears in its medial lateral compartment. 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  Psych:  alert and oriented Knee: Inspection with no erythema or effusion or obvious bony abnormalities. Had soft tissue mass palpated inferolateral to patella on left knee, unchanged from previous Palpation with no warmth,  joint line tenderness, patellar tenderness, or condyle tenderness. ROM full in flexion and extension and lower leg rotation. Ligaments with solid consistent endpoints including ACL, PCL, LCL, MCL. Negative Mcmurray's, Apley's, and Thessalonian tests. Non painful patellar compression. Patellar glide without crepitus. Patellar and quadriceps tendons unremarkable. Hamstring and quadriceps strength is normal.   Hip: ROM IR: 35 Deg, ER: 45 Deg, Flexion: 85 Deg, Abduction: 45 Deg, Adduction: 45 Deg Strength IR: 5/5, ER: 5/5, Flexion: 5/5, Extension: 5/5, Abduction: 5/5, Adduction: 5/5 Pelvic alignment unremarkable to inspection and palpation. Standing hip rotation and gait without trendelenburg sign / unsteadiness. Greater trochanter without tenderness to palpation, but had TTP over right ITB distal to greater trochanter. No tenderness over piriformis. No pain with FABER, + pain with FADIR on left at ITB. No SI joint tenderness and normal minimal SI movement.  Limited ultrasound of left knee:  Mass that was inferior lateral to left patella viewed and long and short axis which shows a cyst like mass with increased blood flow surrounding  Summary: Findings consistent with an unchanged cystic mass  Ultrasound performed and interpreted by Cardell Peach, DO   ASSESSMENT & PLAN:   Knee mass, left We'll continue to monitor with either serial ultrasounds her serial MRI with contrast. Would like to make sure this does not grow. Offered injection to try to decrease the size. Has a lot of blood flow to it so would question if would be safe to inject.  It band syndrome, right Gave stretching exercises. Can offer injection the future. He did  not wish to do this right now. Hip abduction strength was intact. Follow-up as needed for this.  he also wanted to discuss right foot pain, he will follow-up at another visit

## 2016-12-30 NOTE — Assessment & Plan Note (Signed)
Gave stretching exercises. Can offer injection the future. He did not wish to do this right now. Hip abduction strength was intact. Follow-up as needed for this.

## 2016-12-31 MED FILL — NAPROXEN 500 MG TABLET: 500 | 30 days supply | Qty: 60 | Fill #0

## 2017-01-14 ENCOUNTER — Ambulatory Visit: Payer: Self-pay | Admitting: Family Medicine

## 2017-02-19 ENCOUNTER — Other Ambulatory Visit: Payer: Self-pay | Admitting: Family Medicine

## 2017-02-19 DIAGNOSIS — M545 Low back pain, unspecified: Secondary | ICD-10-CM

## 2017-02-19 DIAGNOSIS — G8929 Other chronic pain: Secondary | ICD-10-CM

## 2017-03-01 MED FILL — traMADol HCL 50 MG TABS: 50 | 30 days supply | Qty: 60 | Fill #0

## 2017-03-31 MED FILL — AMLODIPINE BESYLATE 10 MG T: 10 | 90 days supply | Qty: 90 | Fill #2

## 2017-03-31 MED FILL — METOPROLOL SUCC ER 100 MG T: 100 | 30 days supply | Qty: 30 | Fill #9

## 2017-03-31 MED FILL — HYDROCHLOROTHIAZIDE 25 MG T: 25 | 30 days supply | Qty: 30 | Fill #9

## 2017-04-01 MED FILL — traMADol HCL 50 MG TABS: 50 | 30 days supply | Qty: 60 | Fill #1

## 2017-04-30 MED FILL — traMADol HCL 50 MG TABS: 50 | 30 days supply | Qty: 60 | Fill #2

## 2017-05-03 ENCOUNTER — Ambulatory Visit: Payer: Self-pay | Attending: Internal Medicine

## 2017-05-06 ENCOUNTER — Other Ambulatory Visit: Payer: Self-pay | Admitting: Pharmacist

## 2017-05-06 DIAGNOSIS — I10 Essential (primary) hypertension: Secondary | ICD-10-CM

## 2017-05-06 MED ORDER — HYDROCHLOROTHIAZIDE 25 MG PO TABS: 25.0000 mg | ORAL_TABLET | Freq: Every day | ORAL | 0 refills | Status: DC

## 2017-05-06 MED ORDER — METOPROLOL SUCCINATE ER 100 MG PO TB24: 100.0000 mg | ORAL_TABLET | Freq: Every day | ORAL | 0 refills | Status: DC

## 2017-05-06 MED FILL — METOPROLOL SUCC ER 100 MG T: 100 | 30 days supply | Qty: 30 | Fill #0

## 2017-05-06 MED FILL — HYDROCHLOROTHIAZIDE 25 MG T: 25 | 30 days supply | Qty: 30 | Fill #0

## 2017-05-13 ENCOUNTER — Ambulatory Visit: Payer: Self-pay | Admitting: Internal Medicine

## 2017-05-17 ENCOUNTER — Ambulatory Visit: Payer: Self-pay | Attending: Internal Medicine | Admitting: Internal Medicine

## 2017-05-17 ENCOUNTER — Encounter: Payer: Self-pay | Admitting: Internal Medicine

## 2017-05-17 VITALS — BP 135/89 | HR 79 | Temp 98.0°F | Resp 18 | Ht 71.0 in | Wt 260.2 lb

## 2017-05-17 DIAGNOSIS — I10 Essential (primary) hypertension: Secondary | ICD-10-CM | POA: Insufficient documentation

## 2017-05-17 DIAGNOSIS — M6740 Ganglion, unspecified site: Secondary | ICD-10-CM

## 2017-05-17 DIAGNOSIS — M1712 Unilateral primary osteoarthritis, left knee: Secondary | ICD-10-CM

## 2017-05-17 DIAGNOSIS — Z7982 Long term (current) use of aspirin: Secondary | ICD-10-CM | POA: Insufficient documentation

## 2017-05-17 DIAGNOSIS — Z79899 Other long term (current) drug therapy: Secondary | ICD-10-CM | POA: Insufficient documentation

## 2017-05-17 DIAGNOSIS — M25562 Pain in left knee: Secondary | ICD-10-CM | POA: Insufficient documentation

## 2017-05-17 DIAGNOSIS — K219 Gastro-esophageal reflux disease without esophagitis: Secondary | ICD-10-CM | POA: Insufficient documentation

## 2017-05-17 DIAGNOSIS — Z1159 Encounter for screening for other viral diseases: Secondary | ICD-10-CM | POA: Insufficient documentation

## 2017-05-17 DIAGNOSIS — M1990 Unspecified osteoarthritis, unspecified site: Secondary | ICD-10-CM | POA: Insufficient documentation

## 2017-05-17 DIAGNOSIS — M67469 Ganglion, unspecified knee: Secondary | ICD-10-CM | POA: Insufficient documentation

## 2017-05-17 DIAGNOSIS — E785 Hyperlipidemia, unspecified: Secondary | ICD-10-CM | POA: Insufficient documentation

## 2017-05-17 DIAGNOSIS — E669 Obesity, unspecified: Secondary | ICD-10-CM | POA: Insufficient documentation

## 2017-05-17 DIAGNOSIS — M545 Low back pain: Secondary | ICD-10-CM | POA: Insufficient documentation

## 2017-05-17 MED ORDER — TRAMADOL HCL 50 MG PO TABS: 50.0000 mg | ORAL_TABLET | Freq: Two times a day (BID) | ORAL | 2 refills | Status: DC | PRN

## 2017-05-17 NOTE — Progress Notes (Deleted)
Patient ID: Damon Peck, male    DOB: 05/21/1966  MRN: [id number]  CC: Establish Care (Patient is here for hypertension and refill meds. )   Subjective: Damon Peck is a 51 y.o. male who presents for chronic disease management.  Last saw Dr. Armen Peck February 2018 His concerns today include:  [name], HL, obesity  1. LT knee pain: Saw ortho MRI Naprosyn does not help. Tramadol helps. Takes 1-2 x a day. Request RF -wears sleeve on knee when exercises. Now worn. Requesting new one  2. HTN Compliant with meds and salt -goes to gym 3-4 x a wk -no CP/SOB/LE  Edema  3. HL: tolerating Zocor Patient Active Problem List   Diagnosis Date Noted  . It band syndrome, right 12/30/2016  . Knee mass, left 12/09/2016  . Obesity (BMI 30-39.9) 08/06/2016  . Gastroesophageal reflux disease 04/26/2016  . Midline low back pain without sciatica 08/09/2014  . Dyslipidemia 04/24/2013  . HTN (hypertension) 12/15/2012     Current Outpatient Medications on File Prior to Visit  Medication Sig Dispense Refill  . amLODipine (NORVASC) 10 MG tablet Take 1 tablet (10 mg total) by mouth daily. 30 tablet 11  . aspirin 81 MG tablet Take 1 tablet (81 mg total) by mouth daily. 90 tablet 3  . cyclobenzaprine (FLEXERIL) 5 MG tablet Take 1 tablet (5 mg total) by mouth at bedtime. 30 tablet 1  . fluticasone (FLONASE) 50 MCG/ACT nasal spray Place 2 sprays into both nostrils daily. 16 g 6  . hydrochlorothiazide (HYDRODIURIL) 25 MG tablet Take 1 tablet (25 mg total) by mouth daily. 30 tablet 0  . metoprolol succinate (TOPROL-XL) 100 MG 24 hr tablet Take 1 tablet (100 mg total) by mouth daily. Take with or immediately following a meal. 30 tablet 0  . naproxen (NAPROSYN) 500 MG tablet Take 1 tablet (500 mg total) by mouth 2 (two) times daily as needed. 60 tablet 1  . traMADol (ULTRAM) 50 MG tablet TAKE 1 TABLET BY MOUTH EVERY 12 HOURS AS NEEDED 60 tablet 2  . nitroGLYCERIN (NITROGLYN) 2 % ointment Apply  0.5 inches topically 4 (four) times daily. (Patient not taking: Reported on 05/17/2017) 30 g 2  . simvastatin (ZOCOR) 10 MG tablet Take 1 tablet (10 mg total) by mouth at bedtime. (Patient not taking: Reported on 05/17/2017) 30 tablet 11   No current facility-administered medications on file prior to visit.     No Known Allergies  Social History   Socioeconomic History  . Marital status: Married    Spouse name: Not on file  . Number of children: Not on file  . Years of education: Not on file  . Highest education level: Not on file  Social Needs  . Financial resource strain: Not on file  . Food insecurity - worry: Not on file  . Food insecurity - inability: Not on file  . Transportation needs - medical: Not on file  . Transportation needs - non-medical: Not on file  Occupational History  . Not on file  Tobacco Use  . Smoking status: Never Smoker  . Smokeless tobacco: Never Used  Substance and Sexual Activity  . Alcohol use: No  . Drug use: No  . Sexual activity: Not on file  Other Topics Concern  . Not on file  Social History Narrative  . Not on file    History reviewed. No pertinent family history.  History reviewed. No pertinent surgical history.  ROS: Review of Systems Negative  except as stated above PHYSICAL EXAM: BP 135/89 (BP Location: Right Arm, Patient Position: Sitting, Cuff Size: Large)   Pulse 79   Temp 98 F (36.7 C) (Oral)   Resp 18   Ht 5\' 11"  (1.803 m)   Wt 260 lb 3.5 oz (118 kg)   SpO2 96%   BMI 36.29 kg/m   Wt Readings from Last 3 Encounters:  05/17/17 260 lb 3.5 oz (118 kg)  12/30/16 241 lb (109.3 kg)  12/09/16 241 lb (109.3 kg)   Physical Exam  General appearance - alert, well appearing, and in no distress Mental status - alert, oriented to person, place, and time, normal mood, behavior, speech, dress, motor activity, and thought processes Neck - supple, no significant adenopathy Chest - clear to auscultation, no wheezes, rales or  rhonchi, symmetric air entry Heart - normal rate, regular rhythm, normal S1, S2, no murmurs, rubs, clicks or gallops Musculoskeletal - {:[id number]} Extremities - peripheral pulses normal, no pedal edema, no clubbing or cyanosis   ASSESSMENT AND PLAN: 1. Essential hypertension  - CBC - Comprehensive metabolic panel  2. Dyslipidemia  - Lipid panel  3. Osteoarthritis of left knee, unspecified osteoarthritis type  - traMADol (ULTRAM) 50 MG tablet; Take 1 tablet (50 mg total) every 12 (twelve) hours as needed by mouth.  Dispense: 60 tablet; Refill: 2  4. Ganglion cyst ***  5. Need for hepatitis C screening test  - Hepatitis C Antibody   Patient was given the opportunity to ask questions.  Patient verbalized understanding of the plan and was able to repeat key elements of the plan.   No orders of the defined types were placed in this encounter.    Requested Prescriptions    No prescriptions requested or ordered in this encounter    No Follow-up on file.  Jonah Blueeborah Damian Buckles, MD, FACP

## 2017-05-17 NOTE — Progress Notes (Deleted)
Patient ID: Damon Peck, male    DOB: 08/25/1965  MRN: [id number]  CC: Establish Care (Patient is here for hypertension and refill meds. )   Subjective: Damon Peck is a [age] y.o. male who presents for chronic disease management.  Last saw Dr. Armen PickupFunches February 2018 His concerns today include:  [name], HL, obesity  1. LT knee pain: Saw ortho 12/2016 for pain and swelling to the lateral part of the knee. MRI done and revealed inflamed ganglion cyst with moderately advanced medial and lateral compartment degenerative changes with associated extensive degenerative tearing of both menisci -Naprosyn does not help. Tramadol helps. Takes 1-2 x a day. Request RF -wears sleeve on knee when exercisings. Now worn. Requesting new one  2. HTN Compliant with meds and salt restriction -goes to gym 3-4 x a wk -no CP/SOB/LE  Edema  3. HL: tolerating Zocor  Patient Active Problem List   Diagnosis Date Noted  . It band syndrome, right 12/30/2016  . Knee mass, left 12/09/2016  . Obesity (BMI 30-39.9) 08/06/2016  . Gastroesophageal reflux disease 04/26/2016  . Midline low back pain without sciatica 08/09/2014  . Dyslipidemia 04/24/2013  . HTN (hypertension) 12/15/2012     Current Outpatient Medications on File Prior to Visit  Medication Sig Dispense Refill  . amLODipine (NORVASC) 10 MG tablet Take 1 tablet (10 mg total) by mouth daily. 30 tablet 11  . aspirin 81 MG tablet Take 1 tablet (81 mg total) by mouth daily. 90 tablet 3  . cyclobenzaprine (FLEXERIL) 5 MG tablet Take 1 tablet (5 mg total) by mouth at bedtime. 30 tablet 1  . fluticasone (FLONASE) 50 MCG/ACT nasal spray Place 2 sprays into both nostrils daily. 16 g 6  . hydrochlorothiazide (HYDRODIURIL) 25 MG tablet Take 1 tablet (25 mg total) by mouth daily. 30 tablet 0  . metoprolol succinate (TOPROL-XL) 100 MG 24 hr tablet Take 1 tablet (100 mg total) by mouth daily. Take with or immediately following a meal. 30 tablet 0    . naproxen (NAPROSYN) 500 MG tablet Take 1 tablet (500 mg total) by mouth 2 (two) times daily as needed. 60 tablet 1  . traMADol (ULTRAM) 50 MG tablet TAKE 1 TABLET BY MOUTH EVERY 12 HOURS AS NEEDED 60 tablet 2  . nitroGLYCERIN (NITROGLYN) 2 % ointment Apply 0.5 inches topically 4 (four) times daily. (Patient not taking: Reported on 05/17/2017) 30 g 2  . simvastatin (ZOCOR) 10 MG tablet Take 1 tablet (10 mg total) by mouth at bedtime. (Patient not taking: Reported on 05/17/2017) 30 tablet 11   No current facility-administered medications on file prior to visit.     No Known Allergies  Social History   Socioeconomic History  . Marital status: Married    Spouse name: Not on file  . Number of children: Not on file  . Years of education: Not on file  . Highest education level: Not on file  Social Needs  . Financial resource strain: Not on file  . Food insecurity - worry: Not on file  . Food insecurity - inability: Not on file  . Transportation needs - medical: Not on file  . Transportation needs - non-medical: Not on file  Occupational History  . Not on file  Tobacco Use  . Smoking status: Never Smoker  . Smokeless tobacco: Never Used  Substance and Sexual Activity  . Alcohol use: No  . Drug use: No  . Sexual activity: Not on file  Other  Topics Concern  . Not on file  Social History Narrative  . Not on file    History reviewed. No pertinent family history.  History reviewed. No pertinent surgical history.  ROS: Review of Systems Negative except as stated above PHYSICAL EXAM: BP 135/89 (BP Location: Right Arm, Patient Position: Sitting, Cuff Size: Large)   Pulse 79   Temp 98 F (36.7 C) (Oral)   Resp 18   Ht 5\' 11"  (1.803 m)   Wt 260 lb 3.5 oz (118 kg)   SpO2 96%   BMI 36.29 kg/m   Wt Readings from Last 3 Encounters:  05/17/17 260 lb 3.5 oz (118 kg)  12/30/16 241 lb (109.3 kg)  12/09/16 241 lb (109.3 kg)   Physical Exam  General appearance - alert, well  appearing, and in no distress Mental status - alert, oriented to person, place, and time, normal mood, behavior, speech, dress, motor activity, and thought processes Neck - supple, no significant adenopathy Chest - clear to auscultation, no wheezes, rales or rhonchi, symmetric air entry Heart - normal rate, regular rhythm, normal S1, S2, no murmurs, rubs, clicks or gallops Musculoskeletal -left knee: Mild joint enlargement.  No point tenderness.  Good range of motion.  Small Extremities - peripheral pulses normal, no pedal edema, no clubbing or cyanosis   ASSESSMENT AND PLAN: 1. Essential hypertension At goal.  To new current medications - CBC - Comprehensive metabolic panel  2. Dyslipidemia  - Lipid panel  3. Osteoarthritis of left knee, unspecified osteoarthritis type Encouraged him to continue regular exercise as he has been doing [facility] reviewed CMA reviewed and had patient sign a new controlled substance prescribing agreement - traMADol (ULTRAM) 50 MG tablet; Take 1 tablet (50 mg total) every 12 (twelve) hours as needed by mouth.  Dispense: 60 tablet; Refill: 2  4. Ganglion cyst Followed by orthopedics  5. Need for hepatitis C screening test - Hepatitis C Antibody   Patient was given the opportunity to ask questions.  Patient verbalized understanding of the plan and was able to repeat key elements of the plan.   No orders of the defined types were placed in this encounter.    Requested Prescriptions    No prescriptions requested or ordered in this encounter    No Follow-up on file.  Jonah Blueeborah Chinaza Rooke, MD, FACP

## 2017-05-18 LAB — CBC
HEMATOCRIT: 43.2 % (ref 37.5–51.0)
HEMOGLOBIN: 14.2 g/dL (ref 13.0–17.7)
MCH: 27.2 pg (ref 26.6–33.0)
MCHC: 32.9 g/dL (ref 31.5–35.7)
MCV: 83 fL (ref 79–97)
Platelets: 288 10*3/uL (ref 150–379)
RBC: 5.23 x10E6/uL (ref 4.14–5.80)
RDW: 14 % (ref 12.3–15.4)
WBC: 8.9 10*3/uL (ref 3.4–10.8)

## 2017-05-18 LAB — LIPID PANEL
Chol/HDL Ratio: 4.4 ratio (ref 0.0–5.0)
Cholesterol, Total: 153 mg/dL (ref 100–199)
HDL: 35 mg/dL — ABNORMAL LOW (ref >39)
LDL Calculated: 50 mg/dL (ref 0–99)
Triglycerides: 342 mg/dL — ABNORMAL HIGH (ref 0–149)
VLDL Cholesterol Cal: 68 mg/dL — ABNORMAL HIGH (ref 5–40)

## 2017-05-18 LAB — COMPREHENSIVE METABOLIC PANEL
A/G RATIO: 1.6 (ref 1.2–2.2)
ALBUMIN: 4.7 g/dL (ref 3.5–5.5)
ALT: 27 IU/L (ref 0–44)
AST: 23 IU/L (ref 0–40)
Alkaline Phosphatase: 57 IU/L (ref 39–117)
BILIRUBIN TOTAL: 0.2 mg/dL (ref 0.0–1.2)
BUN / CREAT RATIO: 18 (ref 9–20)
BUN: 12 mg/dL (ref 6–24)
CHLORIDE: 98 mmol/L (ref 96–106)
CO2: 26 mmol/L (ref 20–29)
Calcium: 9.2 mg/dL (ref 8.7–10.2)
Creatinine, Ser: 0.67 mg/dL — ABNORMAL LOW (ref 0.76–1.27)
GFR calc non Af Amer: 110 mL/min/{1.73_m2} (ref >59)
GFR, EST AFRICAN AMERICAN: 127 mL/min/{1.73_m2} (ref >59)
GLOBULIN, TOTAL: 2.9 g/dL (ref 1.5–4.5)
Glucose: 93 mg/dL (ref 65–99)
POTASSIUM: 4.1 mmol/L (ref 3.5–5.2)
Sodium: 141 mmol/L (ref 134–144)
TOTAL PROTEIN: 7.6 g/dL (ref 6.0–8.5)

## 2017-05-18 LAB — HEPATITIS C ANTIBODY: HEP C VIRUS AB: 0.1 {s_co_ratio} (ref 0.0–0.9)

## 2017-05-19 ENCOUNTER — Telehealth: Payer: Self-pay

## 2017-05-19 ENCOUNTER — Other Ambulatory Visit: Payer: Self-pay | Admitting: Internal Medicine

## 2017-05-19 MED ORDER — ATORVASTATIN CALCIUM 10 MG PO TABS: 10.0000 mg | ORAL_TABLET | Freq: Every day | ORAL | 3 refills | Status: DC

## 2017-05-19 MED FILL — ?ATORVASTATIN 10 MG TABLET: 10 | 30 days supply | Qty: 30 | Fill #0

## 2017-05-19 NOTE — Progress Notes (Signed)
Change from Zocor to Lipitor for 2 reasons: pt on Norvasc and can not titrate Zocor beyond a certain dose while on Norvasc. Also Lipitor may help lower TG better than Zocor.

## 2017-05-19 NOTE — Telephone Encounter (Signed)
Contacted pt to go over lab results pt is aware and is aware of medication change. Pt doesn't have any questions or concerns

## 2017-05-19 NOTE — Progress Notes (Signed)
Patient ID: Damon Peck, male    DOB: 09/17/1965  MRN: [id number]  CC: Establish Care (Patient is here for hypertension and refill meds. )   Subjective: Damon Peck is a 51 y.o. male who presents for chronic disease management.  Last saw Dr. Armen PickupFunches February 2018 His concerns today include:  [name], HL, obesity  1. LT knee pain: Saw ortho 12/2016 for pain and swelling to the lateral part of the knee. MRI done and revealed inflamed ganglion cyst with moderately advanced medial and lateral compartment degenerative changes with associated extensive degenerative tearing of both menisci -Naprosyn does not help. Tramadol helps. Takes 1-2 x a day. Request RF -wears sleeve on knee when exercisings. Now worn. Requesting new one  2. HTN Compliant with meds and salt restriction -goes to gym 3-4 x a wk -no CP/SOB/LE  Edema  3. HL: tolerating Zocor  Patient Active Problem List   Diagnosis Date Noted  . It band syndrome, right 12/30/2016  . Knee mass, left 12/09/2016  . Obesity (BMI 30-39.9) 08/06/2016  . Gastroesophageal reflux disease 04/26/2016  . Midline low back pain without sciatica 08/09/2014  . Dyslipidemia 04/24/2013  . HTN (hypertension) 12/15/2012     Current Outpatient Medications on File Prior to Visit  Medication Sig Dispense Refill  . amLODipine (NORVASC) 10 MG tablet Take 1 tablet (10 mg total) by mouth daily. 30 tablet 11  . aspirin 81 MG tablet Take 1 tablet (81 mg total) by mouth daily. 90 tablet 3  . cyclobenzaprine (FLEXERIL) 5 MG tablet Take 1 tablet (5 mg total) by mouth at bedtime. 30 tablet 1  . fluticasone (FLONASE) 50 MCG/ACT nasal spray Place 2 sprays into both nostrils daily. 16 g 6  . hydrochlorothiazide (HYDRODIURIL) 25 MG tablet Take 1 tablet (25 mg total) by mouth daily. 30 tablet 0  . metoprolol succinate (TOPROL-XL) 100 MG 24 hr tablet Take 1 tablet (100 mg total) by mouth daily. Take with or immediately following a meal. 30 tablet 0  .  naproxen (NAPROSYN) 500 MG tablet Take 1 tablet (500 mg total) by mouth 2 (two) times daily as needed. 60 tablet 1  . traMADol (ULTRAM) 50 MG tablet TAKE 1 TABLET BY MOUTH EVERY 12 HOURS AS NEEDED 60 tablet 2  . nitroGLYCERIN (NITROGLYN) 2 % ointment Apply 0.5 inches topically 4 (four) times daily. (Patient not taking: Reported on 05/17/2017) 30 g 2  . simvastatin (ZOCOR) 10 MG tablet Take 1 tablet (10 mg total) by mouth at bedtime. (Patient not taking: Reported on 05/17/2017) 30 tablet 11   No current facility-administered medications on file prior to visit.     No Known Allergies  Social History   Socioeconomic History  . Marital status: Married    Spouse name: Not on file  . Number of children: Not on file  . Years of education: Not on file  . Highest education level: Not on file  Social Needs  . Financial resource strain: Not on file  . Food insecurity - worry: Not on file  . Food insecurity - inability: Not on file  . Transportation needs - medical: Not on file  . Transportation needs - non-medical: Not on file  Occupational History  . Not on file  Tobacco Use  . Smoking status: Never Smoker  . Smokeless tobacco: Never Used  Substance and Sexual Activity  . Alcohol use: No  . Drug use: No  . Sexual activity: Not on file  Other Topics Concern  .  Not on file  Social History Narrative  . Not on file    History reviewed. No pertinent family history.  History reviewed. No pertinent surgical history.  ROS: Review of Systems Negative except as stated above PHYSICAL EXAM: BP 135/89 (BP Location: Right Arm, Patient Position: Sitting, Cuff Size: Large)   Pulse 79   Temp 98 F (36.7 C) (Oral)   Resp 18   Ht 5\' 11"  (1.803 m)   Wt 260 lb 3.5 oz (118 kg)   SpO2 96%   BMI 36.29 kg/m   Wt Readings from Last 3 Encounters:  05/17/17 260 lb 3.5 oz (118 kg)  12/30/16 241 lb (109.3 kg)  12/09/16 241 lb (109.3 kg)   Physical Exam  General appearance - alert, well  appearing, and in no distress Mental status - alert, oriented to person, place, and time, normal mood, behavior, speech, dress, motor activity, and thought processes Neck - supple, no significant adenopathy Chest - clear to auscultation, no wheezes, rales or rhonchi, symmetric air entry Heart - normal rate, regular rhythm, normal S1, S2, no murmurs, rubs, clicks or gallops Musculoskeletal -left knee: Mild joint enlargement.  No point tenderness.  Good range of motion.  Small Extremities - peripheral pulses normal, no pedal edema, no clubbing or cyanosis   ASSESSMENT AND PLAN: 1. Essential hypertension At goal.  Continue current medications - CBC - Comprehensive metabolic panel  2. Dyslipidemia - Lipid panel  3. Osteoarthritis of left knee, unspecified osteoarthritis type Encouraged him to continue regular exercise as he has been doing [facility] reviewed CMA reviewed and had patient sign a new controlled substance prescribing agreement - traMADol (ULTRAM) 50 MG tablet; Take 1 tablet (50 mg total) every 12 (twelve) hours as needed by mouth.  Dispense: 60 tablet; Refill: 2  4. Ganglion cyst Followed by orthopedics  5. Need for hepatitis C screening test - Hepatitis C Antibody   Patient was given the opportunity to ask questions.  Patient verbalized understanding of the plan and was able to repeat key elements of the plan.   No orders of the defined types were placed in this encounter.    Requested Prescriptions    No prescriptions requested or ordered in this encounter    F/u in 3-4 mths.  Jonah Blueeborah Vickie Melnik, MD, FACP

## 2017-05-31 MED FILL — traMADol HCL 50 MG TABS: 50 | 30 days supply | Qty: 60 | Fill #0

## 2017-06-07 ENCOUNTER — Other Ambulatory Visit: Payer: Self-pay | Admitting: Internal Medicine

## 2017-06-07 DIAGNOSIS — I10 Essential (primary) hypertension: Secondary | ICD-10-CM

## 2017-06-07 MED FILL — HYDROCHLOROTHIAZIDE 25 MG T: 25 | 30 days supply | Qty: 30 | Fill #0

## 2017-06-07 MED FILL — METOPROLOL SUCC ER 100 MG T: 100 | 30 days supply | Qty: 30 | Fill #0

## 2017-06-15 MED FILL — ATORVASTATIN 10 MG TABLET: 10 | 30 days supply | Qty: 30 | Fill #1

## 2017-06-30 MED FILL — traMADol HCL 50 MG TABS: 50 | 30 days supply | Qty: 60 | Fill #1

## 2017-07-07 MED FILL — METOPROLOL SUCC ER 100 MG T: 100 | 30 days supply | Qty: 30 | Fill #1

## 2017-07-07 MED FILL — HYDROCHLOROTHIAZIDE 25 MG T: 25 | 30 days supply | Qty: 30 | Fill #1

## 2017-07-12 MED FILL — ?ATORVASTATIN 10 MG TABLET: 10 | 30 days supply | Qty: 30 | Fill #2

## 2017-08-02 MED FILL — traMADol HCL 50 MG TABS: 50 | 30 days supply | Qty: 60 | Fill #2

## 2017-08-06 MED FILL — HYDROCHLOROTHIAZIDE 25 MG T: 25 | 30 days supply | Qty: 30 | Fill #2

## 2017-08-06 MED FILL — METOPROLOL SUCC ER 100 MG T: 100 | 30 days supply | Qty: 30 | Fill #2

## 2017-08-16 MED FILL — ?ATORVASTATIN 10 MG TABLET: 10 | 30 days supply | Qty: 30 | Fill #3

## 2017-09-06 ENCOUNTER — Other Ambulatory Visit: Payer: Self-pay | Admitting: Internal Medicine

## 2017-09-06 ENCOUNTER — Telehealth: Payer: Self-pay | Admitting: Internal Medicine

## 2017-09-06 DIAGNOSIS — M1712 Unilateral primary osteoarthritis, left knee: Secondary | ICD-10-CM

## 2017-09-06 DIAGNOSIS — I10 Essential (primary) hypertension: Secondary | ICD-10-CM

## 2017-09-06 MED FILL — METOPROLOL SUCCINATE ER 100: 100 | 30 days supply | Qty: 30 | Fill #0

## 2017-09-06 MED FILL — HYDROCHLOROTHIAZIDE 25 MG T: 25 | 30 days supply | Qty: 30 | Fill #0

## 2017-09-10 MED FILL — traMADol HCL 50 MG TABS: 50 | 30 days supply | Qty: 60 | Fill #0

## 2017-09-10 NOTE — Telephone Encounter (Signed)
Received refill request on tramadol.  [place] [place] controlled substance reporting system reviewed and is appropriate.  Refill generated.

## 2017-09-10 NOTE — Telephone Encounter (Signed)
Pt is aware tha rx is ready

## 2017-09-14 ENCOUNTER — Encounter: Payer: Self-pay | Admitting: Internal Medicine

## 2017-09-14 ENCOUNTER — Ambulatory Visit: Payer: Self-pay | Attending: Internal Medicine | Admitting: Internal Medicine

## 2017-09-14 VITALS — BP 154/98 | HR 62 | Temp 97.8°F | Resp 16 | Ht 71.0 in | Wt 269.0 lb

## 2017-09-14 DIAGNOSIS — E669 Obesity, unspecified: Secondary | ICD-10-CM | POA: Insufficient documentation

## 2017-09-14 DIAGNOSIS — I10 Essential (primary) hypertension: Secondary | ICD-10-CM | POA: Insufficient documentation

## 2017-09-14 DIAGNOSIS — Z79899 Other long term (current) drug therapy: Secondary | ICD-10-CM | POA: Insufficient documentation

## 2017-09-14 DIAGNOSIS — Z760 Encounter for issue of repeat prescription: Secondary | ICD-10-CM | POA: Insufficient documentation

## 2017-09-14 DIAGNOSIS — E785 Hyperlipidemia, unspecified: Secondary | ICD-10-CM | POA: Insufficient documentation

## 2017-09-14 DIAGNOSIS — M1712 Unilateral primary osteoarthritis, left knee: Secondary | ICD-10-CM | POA: Insufficient documentation

## 2017-09-14 DIAGNOSIS — Z6837 Body mass index (BMI) 37.0-37.9, adult: Secondary | ICD-10-CM | POA: Insufficient documentation

## 2017-09-14 MED ORDER — METOPROLOL SUCCINATE ER 100 MG PO TB24: ORAL_TABLET | ORAL | 11 refills | Status: DC

## 2017-09-14 MED ORDER — AMLODIPINE BESYLATE 5 MG PO TABS: 5.0000 mg | ORAL_TABLET | Freq: Every day | ORAL | 11 refills | Status: DC

## 2017-09-14 MED ORDER — HYDROCHLOROTHIAZIDE 25 MG PO TABS: 25.0000 mg | ORAL_TABLET | Freq: Every day | ORAL | 11 refills | Status: DC

## 2017-09-14 MED FILL — AMLODIPINE BESYLATE 5 MG TA: 5 | 30 days supply | Qty: 30 | Fill #0

## 2017-09-14 NOTE — Progress Notes (Signed)
Patient ID: Damon Peck, male    DOB: 01/29/1966  MRN: [id number]  CC: Medication Refill   Subjective: Damon Peck is a 52 y.o. male who presents for chronic ds management His concerns today include:  [name], HL, obesity, OA LT knee  1.  HTN: Patient was taking HCTZ and metoprolol.  Norvasc is on his list but patient states he had been out of that for quite some time because the pharmacy did not refill it for him. Denies chest pains or shortness of breath.  No lower extremity edema.  2.  HL: On last visit we did lipid profile which was not at goal.  I had recommended changing Zocor to Lipitor which she has done and is tolerating well.  3.Saw ortho 12/2016 for pain and swelling to the lateral part of the knee. MRI done and revealed inflamed ganglion cyst with moderately advanced medial and lateral compartment degenerative changes with associated extensive degenerative tearing of both menisci -Reports good pain control with tramadol.  No drowsiness or constipation with the medication.  He recently picked up a refill.  He goes to the gym 3 days a week to exercise.  The sleeve that he had for the knee is warm.  He is wanting to know whether we have 1 that we can give him today    Patient Active Problem List   Diagnosis Date Noted  . It band syndrome, right 12/30/2016  . Knee mass, left 12/09/2016  . Obesity (BMI 30-39.9) 08/06/2016  . Gastroesophageal reflux disease 04/26/2016  . Midline low back pain without sciatica 08/09/2014  . Dyslipidemia 04/24/2013  . HTN (hypertension) 12/15/2012     Current Outpatient Medications on File Prior to Visit  Medication Sig Dispense Refill  . atorvastatin (LIPITOR) 10 MG tablet Take 1 tablet (10 mg total) daily by mouth. 90 tablet 3  . fluticasone (FLONASE) 50 MCG/ACT nasal spray Place 2 sprays into both nostrils daily. 16 g 6  . traMADol (ULTRAM) 50 MG tablet TAKE 1 TABLET BY MOUTH EVERY 12 HOURS AS NEEDED 60 tablet 2  .  naproxen (NAPROSYN) 500 MG tablet Take 1 tablet (500 mg total) by mouth 2 (two) times daily as needed. (Patient not taking: Reported on 09/14/2017) 60 tablet 1  . nitroGLYCERIN (NITROGLYN) 2 % ointment Apply 0.5 inches topically 4 (four) times daily. (Patient not taking: Reported on 05/17/2017) 30 g 2   No current facility-administered medications on file prior to visit.     No Known Allergies  Social History   Socioeconomic History  . Marital status: Married    Spouse name: Not on file  . Number of children: Not on file  . Years of education: Not on file  . Highest education level: Not on file  Social Needs  . Financial resource strain: Not on file  . Food insecurity - worry: Not on file  . Food insecurity - inability: Not on file  . Transportation needs - medical: Not on file  . Transportation needs - non-medical: Not on file  Occupational History  . Not on file  Tobacco Use  . Smoking status: Never Smoker  . Smokeless tobacco: Never Used  Substance and Sexual Activity  . Alcohol use: No  . Drug use: No  . Sexual activity: Not on file  Other Topics Concern  . Not on file  Social History Narrative  . Not on file    History reviewed. No pertinent family history.  Past Surgical History:  Procedure Laterality Date  . COLONOSCOPY WITH PROPOFOL N/A 02/11/2015   Procedure: COLONOSCOPY WITH PROPOFOL;  Surgeon: Charolett Bumpers, MD;  Location: [facility];  Service: Endoscopy;  Laterality: N/A;    ROS: Review of Systems Negative except as stated above.  PHYSICAL EXAM: BP (!) 154/98 (BP Location: Right Arm, Patient Position: Sitting, Cuff Size: Large)   Pulse 62   Temp 97.8 F (36.6 C) (Oral)   Resp 16   Ht 5\' 11"  (1.803 m)   Wt 269 lb (122 kg)   SpO2 95%   BMI 37.52 kg/m   Physical Exam Repeat BP 148/96  General appearance - alert, well appearing, and in no distress Mental status - alert, oriented to person, place, and time, normal mood, behavior, speech, dress,  motor activity, and thought processes Chest - clear to auscultation, no wheezes, rales or rhonchi, symmetric air entry Heart - normal rate, regular rhythm, normal S1, S2, no murmurs, rubs, clicks or gallops Extremities - peripheral pulses normal, no pedal edema, no clubbing or cyanosis   Results for orders placed or performed in visit on 05/17/17  CBC  Result Value Ref Range   WBC 8.9 3.4 - 10.8 x10E3/uL   RBC 5.23 4.14 - 5.80 x10E6/uL   Hemoglobin 14.2 13.0 - 17.7 g/dL   Hematocrit 16.1 09.6 - 51.0 %   MCV 83 79 - 97 fL   MCH 27.2 26.6 - 33.0 pg   MCHC 32.9 31.5 - 35.7 g/dL   RDW 04.5 40.9 - 81.1 %   Platelets 288 150 - 379 x10E3/uL  Comprehensive metabolic panel  Result Value Ref Range   Glucose 93 65 - 99 mg/dL   BUN 12 6 - 24 mg/dL   Creatinine, Ser 9.14 (L) 0.76 - 1.27 mg/dL   GFR calc non Af Amer 110 >59 mL/min/1.73   GFR calc Af Amer 127 >59 mL/min/1.73   BUN/Creatinine Ratio 18 9 - 20   Sodium 141 134 - 144 mmol/L   Potassium 4.1 3.5 - 5.2 mmol/L   Chloride 98 96 - 106 mmol/L   CO2 26 20 - 29 mmol/L   Calcium 9.2 8.7 - 10.2 mg/dL   Total Protein 7.6 6.0 - 8.5 g/dL   Albumin 4.7 3.5 - 5.5 g/dL   Globulin, Total 2.9 1.5 - 4.5 g/dL   Albumin/Globulin Ratio 1.6 1.2 - 2.2   Bilirubin Total 0.2 0.0 - 1.2 mg/dL   Alkaline Phosphatase 57 39 - 117 IU/L   AST 23 0 - 40 IU/L   ALT 27 0 - 44 IU/L  Lipid panel  Result Value Ref Range   Cholesterol, Total 153 100 - 199 mg/dL   Triglycerides 782 (H) 0 - 149 mg/dL   HDL 35 (L) >95 mg/dL   VLDL Cholesterol Cal 68 (H) 5 - 40 mg/dL   LDL Calculated 50 0 - 99 mg/dL   Chol/HDL Ratio 4.4 0.0 - 5.0 ratio  Hepatitis C Antibody  Result Value Ref Range   Hep C Virus Ab 0.1 0.0 - 0.9 s/co ratio    ASSESSMENT AND PLAN: 1. Essential hypertension Not at goal.  Restart Norvasc but at 5 mg daily.  Continue metoprolol and HCTZ. - metoprolol succinate (TOPROL-XL) 100 MG 24 hr tablet; TAKE 1 TABLET BY MOUTH DAILY.  Dispense: 30 tablet;  Refill: 11 - hydrochlorothiazide (HYDRODIURIL) 25 MG tablet; Take 1 tablet (25 mg total) by mouth daily.  Dispense: 30 tablet; Refill: 11 - amLODipine (NORVASC) 5 MG tablet; Take 1 tablet (5  mg total) by mouth daily.  Dispense: 30 tablet; Refill: 11  2. Osteoarthritis of left knee, unspecified osteoarthritis type My nurse will check to see if we have knee sleeve and will give him one if we do Continue tramadol as needed.  3. Hyperlipidemia, unspecified hyperlipidemia type Continue Lipitor Continue regular exercise as tolerated.,  Encourage healthy eating habits  Patient was given the opportunity to ask questions.  Patient verbalized understanding of the plan and was able to repeat key elements of the plan.   No orders of the defined types were placed in this encounter.    Requested Prescriptions   Signed Prescriptions Disp Refills  . metoprolol succinate (TOPROL-XL) 100 MG 24 hr tablet 30 tablet 11    Sig: TAKE 1 TABLET BY MOUTH DAILY.  . hydrochlorothiazide (HYDRODIURIL) 25 MG tablet 30 tablet 11    Sig: Take 1 tablet (25 mg total) by mouth daily.  Marland Kitchen amLODipine (NORVASC) 5 MG tablet 30 tablet 11    Sig: Take 1 tablet (5 mg total) by mouth daily.    Return in about 3 months (around 12/15/2017).  [name], MD, FACP

## 2017-09-14 NOTE — Progress Notes (Signed)
Patient is here for medication refills.

## 2017-09-14 NOTE — Patient Instructions (Signed)
Try to check your blood pressure once a week.  Is 130/80 or lower.  We have added Amlodipine to ake daily.

## 2017-09-17 MED FILL — ?ATORVASTATIN 10 MG TABLET: 10 | 30 days supply | Qty: 30 | Fill #4

## 2017-10-04 MED FILL — HYDROCHLOROTHIAZIDE 25 MG T: 25 | 30 days supply | Qty: 30 | Fill #0

## 2017-10-07 MED FILL — METOPROLOL SUCCINATE ER 100: 100 | 30 days supply | Qty: 30 | Fill #0

## 2017-10-11 MED FILL — AMLODIPINE BESYLATE 5 MG TA: 5 | 30 days supply | Qty: 30 | Fill #1

## 2017-10-18 MED FILL — ?ATORVASTATIN 10MG TABLET: 10 | 30 days supply | Qty: 30 | Fill #5

## 2017-10-19 MED FILL — traMADol HCL 50 MG TABS: 50 | 30 days supply | Qty: 60 | Fill #1

## 2017-11-08 MED FILL — AMLODIPINE BESYLATE 5 MG TA: 5 | 30 days supply | Qty: 30 | Fill #2

## 2017-11-08 MED FILL — METOPROLOL SUCCINATE ER 100: 100 | 30 days supply | Qty: 30 | Fill #1

## 2017-11-08 MED FILL — HYDROCHLOROTHIAZIDE 25 MG T: 25 | 30 days supply | Qty: 30 | Fill #1

## 2017-11-10 ENCOUNTER — Ambulatory Visit: Payer: Self-pay

## 2017-11-15 MED FILL — ATORVASTATIN 10 MG TABLET: 10 | 30 days supply | Qty: 30 | Fill #6

## 2017-11-15 MED FILL — traMADol HCL 50 MG TABS: 50 | 30 days supply | Qty: 60 | Fill #2

## 2017-12-06 MED FILL — AMLODIPINE BESYLATE 5 MG TA: 5 | 60 days supply | Qty: 60 | Fill #3

## 2017-12-06 MED FILL — METOPROLOL SUCCINATE ER 100: 100 | 60 days supply | Qty: 60 | Fill #2

## 2017-12-06 MED FILL — ATORVASTATIN 10 MG TABLET: 10 | 60 days supply | Qty: 60 | Fill #7

## 2017-12-06 MED FILL — HYDROCHLOROTHIAZIDE 25 MG T: 25 | 60 days supply | Qty: 60 | Fill #2

## 2017-12-16 ENCOUNTER — Ambulatory Visit: Payer: Self-pay | Attending: Internal Medicine | Admitting: Internal Medicine

## 2017-12-16 ENCOUNTER — Encounter: Payer: Self-pay | Admitting: Internal Medicine

## 2017-12-16 VITALS — BP 132/80 | HR 68 | Temp 98.5°F | Resp 16 | Wt 253.2 lb

## 2017-12-16 DIAGNOSIS — E785 Hyperlipidemia, unspecified: Secondary | ICD-10-CM | POA: Insufficient documentation

## 2017-12-16 DIAGNOSIS — I10 Essential (primary) hypertension: Secondary | ICD-10-CM | POA: Insufficient documentation

## 2017-12-16 DIAGNOSIS — M1712 Unilateral primary osteoarthritis, left knee: Secondary | ICD-10-CM | POA: Insufficient documentation

## 2017-12-16 DIAGNOSIS — E669 Obesity, unspecified: Secondary | ICD-10-CM | POA: Insufficient documentation

## 2017-12-16 DIAGNOSIS — Z6830 Body mass index (BMI) 30.0-30.9, adult: Secondary | ICD-10-CM | POA: Insufficient documentation

## 2017-12-16 DIAGNOSIS — Z79899 Other long term (current) drug therapy: Secondary | ICD-10-CM | POA: Insufficient documentation

## 2017-12-16 DIAGNOSIS — K219 Gastro-esophageal reflux disease without esophagitis: Secondary | ICD-10-CM | POA: Insufficient documentation

## 2017-12-16 DIAGNOSIS — M545 Low back pain: Secondary | ICD-10-CM | POA: Insufficient documentation

## 2017-12-16 NOTE — Progress Notes (Signed)
Patient ID: Damon Peck, male    DOB: 04/12/1966  MRN: [id number]  CC: Hypertension   Subjective: Damon Peck is a 52 y.o. male who presents for chronic disease management. His concerns today include:  [name], HL, obesity, OA LT knee  HTN:  Checks BP 3 x a wk.  Gives range 120-130/80. He limit salt in the foods. Denies chest pains, shortness of breath, lower extremity edema, headaches and dizziness  OA left knee:  Using sleeve intermittently especially when he goes for exercise.  He finds this helpful. He feels the knee is a little better with continued exercise.   Goes to gym 3-4 x awk -uses treadmill, elliptical and stationary bike  HL: Compliant with Lipitor.  Tolerating it without muscle pain or cramps.    Will be gone to [place] for 2 months for his son's wedding.  He has already picked up 4745-month supply of his medications to take with him. Patient Active Problem List   Diagnosis Date Noted  . Osteoarthritis of left knee 09/14/2017  . It band syndrome, right 12/30/2016  . Knee mass, left 12/09/2016  . Obesity (BMI 30-39.9) 08/06/2016  . Gastroesophageal reflux disease 04/26/2016  . Midline low back pain without sciatica 08/09/2014  . Dyslipidemia 04/24/2013  . HTN (hypertension) 12/15/2012     Current Outpatient Medications on File Prior to Visit  Medication Sig Dispense Refill  . amLODipine (NORVASC) 5 MG tablet Take 1 tablet (5 mg total) by mouth daily. 30 tablet 11  . atorvastatin (LIPITOR) 10 MG tablet Take 1 tablet (10 mg total) daily by mouth. 90 tablet 3  . fluticasone (FLONASE) 50 MCG/ACT nasal spray Place 2 sprays into both nostrils daily. 16 g 6  . hydrochlorothiazide (HYDRODIURIL) 25 MG tablet Take 1 tablet (25 mg total) by mouth daily. 30 tablet 11  . metoprolol succinate (TOPROL-XL) 100 MG 24 hr tablet TAKE 1 TABLET BY MOUTH DAILY. 30 tablet 11  . naproxen (NAPROSYN) 500 MG tablet Take 1 tablet (500 mg total) by mouth 2 (two) times daily  as needed. (Patient not taking: Reported on 09/14/2017) 60 tablet 1  . nitroGLYCERIN (NITROGLYN) 2 % ointment Apply 0.5 inches topically 4 (four) times daily. (Patient not taking: Reported on 05/17/2017) 30 g 2  . traMADol (ULTRAM) 50 MG tablet TAKE 1 TABLET BY MOUTH EVERY 12 HOURS AS NEEDED 60 tablet 2   No current facility-administered medications on file prior to visit.     No Known Allergies  Social History   Socioeconomic History  . Marital status: Married    Spouse name: Not on file  . Number of children: Not on file  . Years of education: Not on file  . Highest education level: Not on file  Occupational History  . Not on file  Social Needs  . Financial resource strain: Not on file  . Food insecurity:    Worry: Not on file    Inability: Not on file  . Transportation needs:    Medical: Not on file    Non-medical: Not on file  Tobacco Use  . Smoking status: Never Smoker  . Smokeless tobacco: Never Used  Substance and Sexual Activity  . Alcohol use: No  . Drug use: No  . Sexual activity: Not on file  Lifestyle  . Physical activity:    Days per week: Not on file    Minutes per session: Not on file  . Stress: Not on file  Relationships  .  Social connections:    Talks on phone: Not on file    Gets together: Not on file    Attends religious service: Not on file    Active member of club or organization: Not on file    Attends meetings of clubs or organizations: Not on file    Relationship status: Not on file  . Intimate partner violence:    Fear of current or ex partner: Not on file    Emotionally abused: Not on file    Physically abused: Not on file    Forced sexual activity: Not on file  Other Topics Concern  . Not on file  Social History Narrative  . Not on file    No family history on file.  Past Surgical History:  Procedure Laterality Date  . COLONOSCOPY WITH PROPOFOL N/A 02/11/2015   Procedure: COLONOSCOPY WITH PROPOFOL;  Surgeon: Charolett Bumpers, MD;   Location: [facility];  Service: Endoscopy;  Laterality: N/A;    ROS: Review of Systems Negative except as stated above PHYSICAL EXAM: BP 132/80   Pulse 68   Temp 98.5 F (36.9 C) (Oral)   Resp 16   Wt 253 lb 3.2 oz (114.9 kg)   SpO2 96%   BMI 35.31 kg/m   .last Physical Exam   General appearance - alert, well appearing, and in no distress Mental status - alert, oriented to person, place, and time, normal mood, behavior, speech, dress, motor activity, and thought processes Neck - supple, no significant adenopathy Chest - clear to auscultation, no wheezes, rales or rhonchi, symmetric air entry Heart - normal rate, regular rhythm, normal S1, S2, no murmurs, rubs, clicks or gallops Extremities - peripheral pulses normal, no pedal edema, no clubbing or cyanosis   ASSESSMENT AND PLAN: 1. Essential hypertension Close to goal.  Continue amlodipine, hydrochlorothiazide and metoprolol.  Continue to limit salt in the foods.  2. Dyslipidemia Continue atorvastatin.  3. Osteoarthritis of left knee, unspecified osteoarthritis type Doing better.  He uses tramadol as needed.  Patient was given the opportunity to ask questions.  Patient verbalized understanding of the plan and was able to repeat key elements of the plan.   No orders of the defined types were placed in this encounter.    Requested Prescriptions    No prescriptions requested or ordered in this encounter    Return in about 3 months (around 03/18/2018).  [name], MD, FACP

## 2018-02-01 ENCOUNTER — Other Ambulatory Visit: Payer: Self-pay | Admitting: Internal Medicine

## 2018-02-01 DIAGNOSIS — M1712 Unilateral primary osteoarthritis, left knee: Secondary | ICD-10-CM

## 2018-02-03 NOTE — Telephone Encounter (Signed)
Pt is aware rx is ready for pick up. 

## 2018-02-03 NOTE — Telephone Encounter (Signed)
Refill request on tramadol received.  [place] substance [place].  Last prescription was filled 11/15/2017.  Refills given.

## 2018-02-07 MED FILL — traMADol HCL 50 MG TABS: 50 | 30 days supply | Qty: 60 | Fill #0

## 2018-02-14 MED FILL — HYDROCHLOROTHIAZIDE 25 MG T: 25 | 60 days supply | Qty: 60 | Fill #3

## 2018-02-14 MED FILL — METOPROLOL SUCCINATE ER 100: 100 | 60 days supply | Qty: 60 | Fill #3

## 2018-02-14 MED FILL — AMLODIPINE BESYLATE 5 MG TA: 5 | 60 days supply | Qty: 60 | Fill #4

## 2018-03-01 MED FILL — ATORVASTATIN 10 MG TABLET: 10 | 60 days supply | Qty: 60 | Fill #8

## 2018-03-02 ENCOUNTER — Ambulatory Visit: Payer: Self-pay | Attending: Family Medicine

## 2018-03-11 MED FILL — traMADol HCL 50 MG TABS: 50 | 30 days supply | Qty: 60 | Fill #1

## 2018-03-18 ENCOUNTER — Ambulatory Visit: Payer: Self-pay | Attending: Internal Medicine | Admitting: Internal Medicine

## 2018-03-18 ENCOUNTER — Encounter: Payer: Self-pay | Admitting: Internal Medicine

## 2018-03-18 VITALS — BP 120/82 | HR 59 | Temp 98.6°F | Resp 16 | Wt 250.2 lb

## 2018-03-18 DIAGNOSIS — E669 Obesity, unspecified: Secondary | ICD-10-CM | POA: Insufficient documentation

## 2018-03-18 DIAGNOSIS — M1712 Unilateral primary osteoarthritis, left knee: Secondary | ICD-10-CM | POA: Insufficient documentation

## 2018-03-18 DIAGNOSIS — E785 Hyperlipidemia, unspecified: Secondary | ICD-10-CM | POA: Insufficient documentation

## 2018-03-18 DIAGNOSIS — Z2821 Immunization not carried out because of patient refusal: Secondary | ICD-10-CM

## 2018-03-18 DIAGNOSIS — Z6834 Body mass index (BMI) 34.0-34.9, adult: Secondary | ICD-10-CM | POA: Insufficient documentation

## 2018-03-18 DIAGNOSIS — Z79899 Other long term (current) drug therapy: Secondary | ICD-10-CM | POA: Insufficient documentation

## 2018-03-18 DIAGNOSIS — I10 Essential (primary) hypertension: Secondary | ICD-10-CM | POA: Insufficient documentation

## 2018-03-18 MED ORDER — ATORVASTATIN CALCIUM 10 MG PO TABS: 10.0000 mg | ORAL_TABLET | Freq: Every day | ORAL | 3 refills | Status: DC

## 2018-03-18 NOTE — Patient Instructions (Signed)
Continue healthy eating habits and regular exercise.  We will plan to do your routine blood tests including cholesterol, kidney and liver function tests on your next visit.

## 2018-03-18 NOTE — Progress Notes (Signed)
Patient ID: Damon Peck, male    DOB: 04/21/1966  MRN: [id number]  CC: Hypertension   Subjective: Damon Peck is a 52 y.o. male who presents for chronic disease management. His concerns today include:  [name], HL, obesity, OA LT knee  HTN:  Checks BP every day.  Range 130/80s.  He denies any chest pains, shortness of breath or lower extremity edema.  No PND orthopnea.  He goes to the gym 3-4 times a week to work on the treadmill for exercise.  Reports compliance with medications and salt restrictions.  HL: Compliant with and tolerating Lipitor.   Patient Active Problem List   Diagnosis Date Noted  . Osteoarthritis of left knee 09/14/2017  . It band syndrome, right 12/30/2016  . Knee mass, left 12/09/2016  . Obesity (BMI 30-39.9) 08/06/2016  . Gastroesophageal reflux disease 04/26/2016  . Midline low back pain without sciatica 08/09/2014  . Dyslipidemia 04/24/2013  . HTN (hypertension) 12/15/2012     Current Outpatient Medications on File Prior to Visit  Medication Sig Dispense Refill  . amLODipine (NORVASC) 5 MG tablet Take 1 tablet (5 mg total) by mouth daily. 30 tablet 11  . fluticasone (FLONASE) 50 MCG/ACT nasal spray Place 2 sprays into both nostrils daily. 16 g 6  . hydrochlorothiazide (HYDRODIURIL) 25 MG tablet Take 1 tablet (25 mg total) by mouth daily. 30 tablet 11  . metoprolol succinate (TOPROL-XL) 100 MG 24 hr tablet TAKE 1 TABLET BY MOUTH DAILY. 30 tablet 11  . nitroGLYCERIN (NITROGLYN) 2 % ointment Apply 0.5 inches topically 4 (four) times daily. (Patient not taking: Reported on 05/17/2017) 30 g 2  . traMADol (ULTRAM) 50 MG tablet TAKE 1 TABLET BY MOUTH EVERY 12 HOURS AS NEEDED 60 tablet 1   No current facility-administered medications on file prior to visit.     No Known Allergies  Social History   Socioeconomic History  . Marital status: Married    Spouse name: Not on file  . Number of children: Not on file  . Years of education: Not  on file  . Highest education level: Not on file  Occupational History  . Not on file  Social Needs  . Financial resource strain: Not on file  . Food insecurity:    Worry: Not on file    Inability: Not on file  . Transportation needs:    Medical: Not on file    Non-medical: Not on file  Tobacco Use  . Smoking status: Never Smoker  . Smokeless tobacco: Never Used  Substance and Sexual Activity  . Alcohol use: No  . Drug use: No  . Sexual activity: Not on file  Lifestyle  . Physical activity:    Days per week: Not on file    Minutes per session: Not on file  . Stress: Not on file  Relationships  . Social connections:    Talks on phone: Not on file    Gets together: Not on file    Attends religious service: Not on file    Active member of club or organization: Not on file    Attends meetings of clubs or organizations: Not on file    Relationship status: Not on file  . Intimate partner violence:    Fear of current or ex partner: Not on file    Emotionally abused: Not on file    Physically abused: Not on file    Forced sexual activity: Not on file  Other Topics Concern  .  Not on file  Social History Narrative  . Not on file    No family history on file.  Past Surgical History:  Procedure Laterality Date  . COLONOSCOPY WITH PROPOFOL N/A 02/11/2015   Procedure: COLONOSCOPY WITH PROPOFOL;  Surgeon: Charolett Bumpers, MD;  Location: [facility];  Service: Endoscopy;  Laterality: N/A;    ROS: Review of Systems Negative except as above.    PHYSICAL EXAM: BP 120/82   Pulse (!) 59   Temp 98.6 F (37 C) (Oral)   Resp 16   Wt 250 lb 3.2 oz (113.5 kg)   SpO2 96%   BMI 34.90 kg/m   Physical Exam  General appearance - alert, well appearing, and in no distress Neck - bilateral symmetric anterior adenopathy Chest - clear to auscultation, no wheezes, rales or rhonchi, symmetric air entry Heart - normal rate, regular rhythm, normal S1, S2, no murmurs, rubs, clicks or  gallops Extremities - peripheral pulses normal, no pedal edema, no clubbing or cyanosis   ASSESSMENT AND PLAN: 1. Essential hypertension At goal.  Continue current medications and DASH diet.  2. Dyslipidemia Continue atorvastatin. - atorvastatin (LIPITOR) 10 MG tablet; Take 1 tablet (10 mg total) by mouth daily.  Dispense: 90 tablet; Refill: 3  3. Influenza vaccination declined    Patient was given the opportunity to ask questions.  Patient verbalized understanding of the plan and was able to repeat key elements of the plan.   No orders of the defined types were placed in this encounter.    Requested Prescriptions   Signed Prescriptions Disp Refills  . atorvastatin (LIPITOR) 10 MG tablet 90 tablet 3    Sig: Take 1 tablet (10 mg total) by mouth daily.    Return in about 3 months (around 06/17/2018).  [name], MD, FACP

## 2018-04-15 MED FILL — HYDROCHLOROTHIAZIDE 25 MG T: 25 | 60 days supply | Qty: 60 | Fill #4

## 2018-04-15 MED FILL — METOPROLOL SUCCINATE ER 100: 100 | 60 days supply | Qty: 60 | Fill #4

## 2018-04-15 MED FILL — ?AMLODIPINE BESYLATE 5 MG T: 5 MG | 30 days supply | Qty: 30 | Fill #5

## 2018-04-22 ENCOUNTER — Other Ambulatory Visit: Payer: Self-pay | Admitting: Internal Medicine

## 2018-04-22 DIAGNOSIS — M1712 Unilateral primary osteoarthritis, left knee: Secondary | ICD-10-CM

## 2018-04-22 NOTE — Telephone Encounter (Signed)
Pt last seen: 03/18/18 Next appt: 06/17/18 Last RX written on: 02/03/18 Date of original fill: 02/07/18 Date of refill(s): 03/11/18  No other controlled substances were filled during this time, please refill if appropriate.

## 2018-04-25 NOTE — Telephone Encounter (Signed)
Pt is aware.  

## 2018-04-26 MED FILL — traMADol HCL 50 MG TABS: 50 | 30 days supply | Qty: 60 | Fill #0

## 2018-05-03 MED FILL — ?ATORVASTATIN 10 MG TABLET: 10 | 30 days supply | Qty: 30 | Fill #9

## 2018-05-23 MED FILL — ?AMLODIPINE BESYLATE 5 MG T: 5 MG | 30 days supply | Qty: 30 | Fill #6

## 2018-05-30 ENCOUNTER — Encounter (HOSPITAL_COMMUNITY): Payer: Self-pay

## 2018-05-30 ENCOUNTER — Emergency Department (HOSPITAL_COMMUNITY): Payer: No Typology Code available for payment source

## 2018-05-30 ENCOUNTER — Emergency Department (HOSPITAL_COMMUNITY)
Admission: EM | Admit: 2018-05-30 | Discharge: 2018-05-30 | Disposition: A | Discharge status: Home / Self Care | Payer: No Typology Code available for payment source | Attending: Emergency Medicine | Admitting: Emergency Medicine

## 2018-05-30 DIAGNOSIS — Y9389 Activity, other specified: Secondary | ICD-10-CM | POA: Diagnosis not present

## 2018-05-30 DIAGNOSIS — Y999 Unspecified external cause status: Secondary | ICD-10-CM | POA: Diagnosis not present

## 2018-05-30 DIAGNOSIS — R930 Abnormal findings on diagnostic imaging of skull and head, not elsewhere classified: Secondary | ICD-10-CM

## 2018-05-30 DIAGNOSIS — Z79899 Other long term (current) drug therapy: Secondary | ICD-10-CM | POA: Insufficient documentation

## 2018-05-30 DIAGNOSIS — S0990XA Unspecified injury of head, initial encounter: Secondary | ICD-10-CM | POA: Insufficient documentation

## 2018-05-30 DIAGNOSIS — M1712 Unilateral primary osteoarthritis, left knee: Secondary | ICD-10-CM | POA: Insufficient documentation

## 2018-05-30 DIAGNOSIS — M25562 Pain in left knee: Secondary | ICD-10-CM

## 2018-05-30 DIAGNOSIS — Y92410 Unspecified street and highway as the place of occurrence of the external cause: Secondary | ICD-10-CM | POA: Insufficient documentation

## 2018-05-30 DIAGNOSIS — I10 Essential (primary) hypertension: Secondary | ICD-10-CM | POA: Diagnosis not present

## 2018-05-30 DIAGNOSIS — M542 Cervicalgia: Secondary | ICD-10-CM | POA: Diagnosis not present

## 2018-05-30 MED ORDER — CYCLOBENZAPRINE HCL 10 MG PO TABS
5.0000 mg | ORAL_TABLET | Freq: Once | ORAL | Status: AC
  Administered 2018-05-30: 5 mg via ORAL
  Filled 2018-05-30: qty 1

## 2018-05-30 MED ORDER — OXYCODONE-ACETAMINOPHEN 5-325 MG PO TABS
1.0000 | ORAL_TABLET | Freq: Once | ORAL | Status: AC
  Administered 2018-05-30: 1 via ORAL
  Filled 2018-05-30: qty 1

## 2018-05-30 MED ORDER — METHOCARBAMOL 750 MG PO TABS: 750.0000 mg | ORAL_TABLET | Freq: Three times a day (TID) | ORAL | 0 refills | Status: DC | PRN

## 2018-05-30 MED FILL — traMADol HCL 50 MG TABS: 50 | 30 days supply | Qty: 60 | Fill #1
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
## 2018-05-30 NOTE — Discharge Instructions (Signed)
Today you were given a knee compression device in addition to a knee brace.  You may use these for comfort.  If you do not want to wear them you do not have to.  If your knee is still bothering you in 1 week then please schedule an appointment with orthopedics.  Please schedule a follow-up appointment with your primary care doctor for additional evaluation.  If you have any concerns please seek additional medical care and evaluation.  Please take Ibuprofen (Advil, motrin) and Tylenol (acetaminophen) to relieve your pain.  You may take up to 600 MG (3 pills) of normal strength ibuprofen every 8 hours as needed.  In between doses of ibuprofen you make take tylenol, up to 1,000 mg (two extra strength pills).  Do not take more than 3,000 mg tylenol in a 24 hour period.  Please check all medication labels as many medications such as pain and cold medications may contain tylenol.  Do not drink alcohol while taking these medications.  Do not take other NSAID'S while taking ibuprofen (such as aleve or naproxen).  Please take ibuprofen with food to decrease stomach upset.  While in the ED your blood pressure was high.  Please follow up with your primary care doctor or the wellness clinic for repeat evaluation as you may need medication.  High blood pressure can cause long term, potentially serious, damage if left untreated.    Today you received medications that may make you sleepy or impair your ability to make decisions.  For the next 24 hours please do not drive, operate heavy machinery, care for a small child with out another adult present, or perform any activities that may cause harm to you or someone else if you were to fall asleep or be impaired.   You are being prescribed a medication which may make you sleepy. Please follow up of listed precautions for at least 24 hours after taking one dose.
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
## 2018-05-30 NOTE — ED Notes (Signed)
Ortho tech called for knee immobilizer. °

## 2018-06-02 ENCOUNTER — Other Ambulatory Visit: Payer: Self-pay

## 2018-06-02 ENCOUNTER — Encounter (HOSPITAL_COMMUNITY): Payer: Self-pay

## 2018-06-02 ENCOUNTER — Emergency Department (HOSPITAL_COMMUNITY)
Admission: EM | Admit: 2018-06-02 | Discharge: 2018-06-02 | Disposition: A | Discharge status: Home / Self Care | Payer: No Typology Code available for payment source | Attending: Emergency Medicine | Admitting: Emergency Medicine

## 2018-06-02 DIAGNOSIS — S8992XD Unspecified injury of left lower leg, subsequent encounter: Secondary | ICD-10-CM | POA: Diagnosis present

## 2018-06-02 DIAGNOSIS — Y929 Unspecified place or not applicable: Secondary | ICD-10-CM | POA: Insufficient documentation

## 2018-06-02 DIAGNOSIS — Z79899 Other long term (current) drug therapy: Secondary | ICD-10-CM | POA: Insufficient documentation

## 2018-06-02 DIAGNOSIS — Y939 Activity, unspecified: Secondary | ICD-10-CM | POA: Diagnosis not present

## 2018-06-02 DIAGNOSIS — Y999 Unspecified external cause status: Secondary | ICD-10-CM | POA: Insufficient documentation

## 2018-06-02 DIAGNOSIS — S8992XA Unspecified injury of left lower leg, initial encounter: Secondary | ICD-10-CM

## 2018-06-02 DIAGNOSIS — M5432 Sciatica, left side: Secondary | ICD-10-CM | POA: Diagnosis not present

## 2018-06-02 DIAGNOSIS — E785 Hyperlipidemia, unspecified: Secondary | ICD-10-CM

## 2018-06-02 HISTORY — DX: Hyperlipidemia, unspecified: E78.5

## 2018-06-02 MED ORDER — DEXAMETHASONE SODIUM PHOSPHATE 10 MG/ML IJ SOLN
10.0000 mg | Freq: Once | INTRAMUSCULAR | Status: AC
  Administered 2018-06-02: 10 mg via INTRAMUSCULAR
  Filled 2018-06-02: qty 1

## 2018-06-02 MED ORDER — PREDNISONE 10 MG (21) PO TBPK: ORAL_TABLET | Freq: Every day | ORAL | 0 refills | Status: DC

## 2018-06-02 MED ORDER — METHOCARBAMOL 500 MG PO TABS
500.0000 mg | ORAL_TABLET | Freq: Once | ORAL | Status: AC
  Administered 2018-06-02: 500 mg via ORAL
  Filled 2018-06-02: qty 1
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
## 2018-06-02 NOTE — Discharge Instructions (Signed)
Take prednisone as prescribed. Do not take antiinflammatories such as advil, ibuprofen, or aleve. You may supplement with tylenol as needed for further pain.  Use robaxin as needed for muscle stiffness or soreness.  Have caution, this may make you tired or groggy.  Do not drive or operate heavy machinery while taking this medicine. Use ice packs or heating pads if this helps control your pain. Follow up with orthopedics for further evaluation of your knee and back. Return to the emergency room if you develop vision changes, vomiting, slurred speech, numbness, loss of bowel or bladder control, or any new or worsening symptoms.

## 2018-06-06 MED FILL — ?ATORVASTATIN 10 MG TABLET: 10 | 30 days supply | Qty: 30 | Fill #0

## 2018-06-07 MED FILL — METOPROLOL SUCCINATE ER 100: 100 | 60 days supply | Qty: 60 | Fill #5

## 2018-06-07 MED FILL — HYDROCHLOROTHIAZIDE 25 MG T: 25 | 60 days supply | Qty: 60 | Fill #5

## 2018-06-15 ENCOUNTER — Ambulatory Visit: Payer: Self-pay | Attending: Family Medicine

## 2018-06-17 ENCOUNTER — Ambulatory Visit: Payer: Self-pay | Admitting: Internal Medicine

## 2018-06-21 MED FILL — ?AMLODIPINE BESYLATE 5 MG T: 5 MG | 30 days supply | Qty: 30 | Fill #7

## 2018-06-30 ENCOUNTER — Other Ambulatory Visit: Payer: Self-pay | Admitting: Internal Medicine

## 2018-06-30 DIAGNOSIS — M1712 Unilateral primary osteoarthritis, left knee: Secondary | ICD-10-CM

## 2018-06-30 NOTE — Telephone Encounter (Signed)
Pt last seen: 03/18/18 Next appt: n/a Last RX written on: 04/25/18 Date of original fill: 04/26/18 Date of refill(s): 05/30/18  No other controlled substances were filled during this time, please refill if appropriate.

## 2018-07-04 NOTE — Telephone Encounter (Signed)
Pt is aware.  

## 2018-07-07 MED FILL — ?ATORVASTATIN 10 MG TABLET: 10 | 30 days supply | Qty: 30 | Fill #1

## 2018-07-07 MED FILL — traMADol HCL 50 MG TABS: 50 | 30 days supply | Qty: 60 | Fill #0

## 2018-07-20 MED FILL — ?AMLODIPINE BESYLATE 5 MG T: 5 MG | 30 days supply | Qty: 30 | Fill #8

## 2018-08-08 MED FILL — HYDROCHLOROTHIAZIDE 25 MG T: 25 | 30 days supply | Qty: 30 | Fill #6

## 2018-08-08 MED FILL — ?ATORVASTATIN 10 MG TABLET: 10 | 30 days supply | Qty: 30 | Fill #2

## 2018-08-08 MED FILL — METOPROLOL SUCCINATE ER 100: 100 | 30 days supply | Qty: 30 | Fill #6

## 2018-08-22 MED FILL — AMLODIPINE BESYLATE 5 MG TA: 5 | 30 days supply | Qty: 30 | Fill #9

## 2018-08-29 ENCOUNTER — Other Ambulatory Visit: Payer: Self-pay | Admitting: Internal Medicine

## 2018-08-29 DIAGNOSIS — M1712 Unilateral primary osteoarthritis, left knee: Secondary | ICD-10-CM

## 2018-09-01 MED FILL — traMADol HCL 50 MG TABS: 50 | 30 days supply | Qty: 60 | Fill #1

## 2018-09-07 ENCOUNTER — Other Ambulatory Visit: Payer: Self-pay | Admitting: Internal Medicine

## 2018-09-07 DIAGNOSIS — I10 Essential (primary) hypertension: Secondary | ICD-10-CM

## 2018-09-07 MED FILL — ?ATORVASTATIN 10 MG TABLET: 10 | 30 days supply | Qty: 30 | Fill #3

## 2018-09-07 MED FILL — METOPROLOL SUCCINATE ER 100: 100 | 30 days supply | Qty: 30 | Fill #7

## 2018-09-07 MED FILL — HYDROCHLOROTHIAZIDE 25 MG T: 25 | 30 days supply | Qty: 30 | Fill #7

## 2018-09-19 MED FILL — AMLODIPINE BESYLATE 5 MG TA: 5 | 30 days supply | Qty: 30 | Fill #0

## 2018-09-23 ENCOUNTER — Other Ambulatory Visit: Payer: Self-pay | Admitting: Internal Medicine

## 2018-09-23 DIAGNOSIS — I10 Essential (primary) hypertension: Secondary | ICD-10-CM

## 2018-09-28 ENCOUNTER — Other Ambulatory Visit: Payer: Self-pay | Admitting: Internal Medicine

## 2018-10-07 ENCOUNTER — Other Ambulatory Visit: Payer: Self-pay

## 2018-10-07 ENCOUNTER — Ambulatory Visit: Payer: Self-pay | Attending: Internal Medicine | Admitting: Internal Medicine

## 2018-10-07 DIAGNOSIS — J302 Other seasonal allergic rhinitis: Secondary | ICD-10-CM

## 2018-10-07 DIAGNOSIS — I10 Essential (primary) hypertension: Secondary | ICD-10-CM

## 2018-10-07 DIAGNOSIS — E785 Hyperlipidemia, unspecified: Secondary | ICD-10-CM

## 2018-10-07 MED ORDER — METOPROLOL SUCCINATE ER 100 MG PO TB24: 100.0000 mg | ORAL_TABLET | Freq: Every day | ORAL | 2 refills | Status: DC

## 2018-10-07 MED ORDER — AMLODIPINE BESYLATE 10 MG PO TABS: 10.0000 mg | ORAL_TABLET | Freq: Every day | ORAL | 2 refills | Status: DC

## 2018-10-07 MED ORDER — FLUTICASONE PROPIONATE 50 MCG/ACT NA SUSP: 2.0000 | Freq: Every day | NASAL | 6 refills | Status: DC

## 2018-10-07 MED ORDER — HYDROCHLOROTHIAZIDE 25 MG PO TABS: 25.0000 mg | ORAL_TABLET | Freq: Every day | ORAL | 2 refills | Status: DC

## 2018-10-07 MED ORDER — ATORVASTATIN CALCIUM 10 MG PO TABS: 10.0000 mg | ORAL_TABLET | Freq: Every day | ORAL | 3 refills | Status: DC

## 2018-10-07 NOTE — Progress Notes (Signed)
Virtual Visit via Telephone Note  I connected with Damon Peck on 10/07/18 at 3:33 p.m by telephone from my office and verified that I am speaking with the correct person using two identifiers. Pt is at home.  No other participants were involved in this conversation.   I discussed the limitations, risks, security and privacy concerns of performing an evaluation and management service by telephone and the availability of in person appointments. I also discussed with the patient that there may be a patient responsible charge related to this service. The patient expressed understanding and agreed to proceed.   History of Present Illness: [name], HL, obesity, OA LT knee.  Last seen 03/2018.   HTN:  Compliant with 3 meds listed No CP/SOB/LE edema. He checks blood pressure at home about every 2 days.  Reports that systolic blood pressure has been in the 140s and diastolic blood pressure in the 80s.  He tries to limit salt in the foods.  He walks 4 days a week for exercise.  HL: Tolerating and compliant with atorvastatin  Request RF on Flonase.  States that he is doing okay now but he starts having problems with allergies later in the spring.  Observations/Objective:  No direct observation done as this was a telephone encounter.    Chemistry      Component Value Date/Time   NA 141 05/17/2017 1522   K 4.1 05/17/2017 1522   CL 98 05/17/2017 1522   CO2 26 05/17/2017 1522   BUN 12 05/17/2017 1522   CREATININE 0.67 (L) 05/17/2017 1522   CREATININE 0.86 04/24/2016 1619      Component Value Date/Time   CALCIUM 9.2 05/17/2017 1522   ALKPHOS 57 05/17/2017 1522   AST 23 05/17/2017 1522   ALT 27 05/17/2017 1522   BILITOT 0.2 05/17/2017 1522     Lab Results  Component Value Date   CHOL 153 05/17/2017   HDL 35 (L) 05/17/2017   LDLCALC 50 05/17/2017   TRIG 342 (H) 05/17/2017   CHOLHDL 4.4 05/17/2017   Lab Results  Component Value Date   WBC 8.9 05/17/2017   HGB 14.2  05/17/2017   HCT 43.2 05/17/2017   MCV 83 05/17/2017   PLT 288 05/17/2017    Assessment and Plan: 1. Dyslipidemia - atorvastatin (LIPITOR) 10 MG tablet; Take 1 tablet (10 mg total) by mouth daily.  Dispense: 90 tablet; Refill: 3 - Lipid panel  2. Essential hypertension Not at goal.  Recommend increase amlodipine to 10 mg daily.  Advised to continue monitoring blood pressure at home with goal being 130/80 or lower - hydrochlorothiazide (HYDRODIURIL) 25 MG tablet; Take 1 tablet (25 mg total) by mouth daily.  Dispense: 90 tablet; Refill: 2 - metoprolol succinate (TOPROL-XL) 100 MG 24 hr tablet; Take 1 tablet (100 mg total) by mouth daily. Take with or immediately following a meal.  Dispense: 90 tablet; Refill: 2 - amLODipine (NORVASC) 10 MG tablet; Take 1 tablet (10 mg total) by mouth daily.  Dispense: 90 tablet; Refill: 2 - CBC - Comprehensive metabolic panel  3. Seasonal allergic rhinitis, unspecified trigger - fluticasone (FLONASE) 50 MCG/ACT nasal spray; Place 2 sprays into both nostrils daily.  Dispense: 16 g; Refill: 6   Follow Up Instructions: F/u in 3 mths   I discussed the assessment and treatment plan with the patient. The patient was provided an opportunity to ask questions and all were answered. The patient agreed with the plan and demonstrated an understanding of the  instructions.   The patient was advised to call back or seek an in-person evaluation if the symptoms worsen or if the condition fails to improve as anticipated.  I provided 12 minutes of non-face-to-face time during this encounter.   Karle Plumber, MD

## 2018-10-08 NOTE — Telephone Encounter (Signed)
error 

## 2018-10-11 ENCOUNTER — Ambulatory Visit: Payer: Self-pay | Attending: Internal Medicine

## 2018-10-11 ENCOUNTER — Other Ambulatory Visit: Payer: Self-pay

## 2018-10-11 MED FILL — POTASSIUM CL ER 20 MEQ TAB: 20 | 30 days supply | Qty: 30 | Fill #0

## 2018-10-11 MED FILL — ?ATORVASTATIN 10 MG TABLET: 10 | 30 days supply | Qty: 30 | Fill #0

## 2018-10-11 MED FILL — ?METOPROLOL SUCC ER 100MG T: 100 | 30 days supply | Qty: 30 | Fill #0

## 2018-10-11 MED FILL — HYDROCHLOROTHIAZIDE 25 MG T: 25 | 30 days supply | Qty: 30 | Fill #0

## 2018-10-11 MED FILL — ?AMLODIPINE BESYLATE 10 MG: 10 | 30 days supply | Qty: 30 | Fill #0

## 2018-10-11 MED FILL — FLUTICASONE PROP 50 MCG SPR: 50 | 30 days supply | Qty: 16 | Fill #0

## 2018-10-12 ENCOUNTER — Telehealth: Payer: Self-pay

## 2018-10-12 LAB — LIPID PANEL
Chol/HDL Ratio: 4.3 ratio (ref 0.0–5.0)
Cholesterol, Total: 162 mg/dL (ref 100–199)
HDL: 38 mg/dL — ABNORMAL LOW (ref >39)
LDL Calculated: 93 mg/dL (ref 0–99)
Triglycerides: 156 mg/dL — ABNORMAL HIGH (ref 0–149)
VLDL Cholesterol Cal: 31 mg/dL (ref 5–40)

## 2018-10-12 LAB — CBC
Hematocrit: 42.4 % (ref 37.5–51.0)
Hemoglobin: 14.1 g/dL (ref 13.0–17.7)
MCH: 27.5 pg (ref 26.6–33.0)
MCHC: 33.3 g/dL (ref 31.5–35.7)
MCV: 83 fL (ref 79–97)
Platelets: 240 10*3/uL (ref 150–450)
RBC: 5.12 x10E6/uL (ref 4.14–5.80)
RDW: 13.2 % (ref 11.6–15.4)
WBC: 7.7 10*3/uL (ref 3.4–10.8)

## 2018-10-12 LAB — COMPREHENSIVE METABOLIC PANEL
ALT: 27 IU/L (ref 0–44)
AST: 16 IU/L (ref 0–40)
Albumin/Globulin Ratio: 1.7 (ref 1.2–2.2)
Albumin: 4.4 g/dL (ref 3.8–4.9)
Alkaline Phosphatase: 51 IU/L (ref 39–117)
BUN/Creatinine Ratio: 26 — ABNORMAL HIGH (ref 9–20)
BUN: 18 mg/dL (ref 6–24)
Bilirubin Total: 0.3 mg/dL (ref 0.0–1.2)
CO2: 25 mmol/L (ref 20–29)
Calcium: 9.2 mg/dL (ref 8.7–10.2)
Chloride: 101 mmol/L (ref 96–106)
Creatinine, Ser: 0.7 mg/dL — ABNORMAL LOW (ref 0.76–1.27)
GFR calc Af Amer: 124 mL/min/{1.73_m2} (ref >59)
GFR calc non Af Amer: 107 mL/min/{1.73_m2} (ref >59)
Globulin, Total: 2.6 g/dL (ref 1.5–4.5)
Glucose: 104 mg/dL — ABNORMAL HIGH (ref 65–99)
Potassium: 4.4 mmol/L (ref 3.5–5.2)
Sodium: 141 mmol/L (ref 134–144)
Total Protein: 7 g/dL (ref 6.0–8.5)

## 2018-10-12 NOTE — Telephone Encounter (Signed)
Contacted pt to go over lab results pt is aware and doesn't have any questions or concerns 

## 2018-11-12 MED FILL — ?METOPROLOL SUCC ER 100MG T: 100 | 30 days supply | Qty: 30 | Fill #1

## 2018-11-12 MED FILL — ?AMLODIPINE BESYLATE 10 MG: 10 | 30 days supply | Qty: 30 | Fill #1

## 2018-11-12 MED FILL — ?ATORVASTATIN 10 MG TABLET: 10 | 30 days supply | Qty: 30 | Fill #1

## 2018-11-12 MED FILL — HYDROCHLOROTHIAZIDE 25 MG T: 25 | 30 days supply | Qty: 30 | Fill #1

## 2018-11-15 MED FILL — ?METOPROLOL SUCC ER 100MG T: 100 | 30 days supply | Qty: 30 | Fill #2

## 2018-12-09 MED FILL — ?ATORVASTATIN 10 MG TABLET: 10 | 30 days supply | Qty: 30 | Fill #2

## 2018-12-09 MED FILL — ?HYDROCHLOROTHIAZIDE 25MG T: 25 | 30 days supply | Qty: 30 | Fill #2

## 2018-12-14 MED FILL — AMOXICILLIN 500 MG CAPSULE: 500 | 7 days supply | Qty: 28 | Fill #0

## 2018-12-19 ENCOUNTER — Other Ambulatory Visit: Payer: Self-pay | Admitting: Internal Medicine

## 2018-12-19 DIAGNOSIS — M1712 Unilateral primary osteoarthritis, left knee: Secondary | ICD-10-CM

## 2018-12-19 MED FILL — ?AMLODIPINE BESYLATE 10 MG: 10 | 30 days supply | Qty: 30 | Fill #2

## 2018-12-21 MED FILL — traMADol HCL 50 MG TABS: 50 | 30 days supply | Qty: 60 | Fill #0

## 2018-12-21 NOTE — Telephone Encounter (Signed)
Gave rx to Maceo at [facility]

## 2019-01-05 MED FILL — ?HYDROCHLOROTHIAZIDE 25MG T: 25 | 30 days supply | Qty: 30 | Fill #3

## 2019-01-05 MED FILL — ?METOPROLOL SUCC ER 100MG T: 100 | 30 days supply | Qty: 30 | Fill #3

## 2019-01-05 MED FILL — ?ATORVASTATIN 10 MG TABLET: 10 | 30 days supply | Qty: 30 | Fill #3

## 2019-01-12 MED FILL — ?AMLODIPINE BESYLATE 10 MG: 10 | 30 days supply | Qty: 30 | Fill #3

## 2019-01-16 ENCOUNTER — Ambulatory Visit: Payer: Self-pay | Attending: Internal Medicine | Admitting: Internal Medicine

## 2019-01-16 ENCOUNTER — Encounter: Payer: Self-pay | Admitting: Internal Medicine

## 2019-01-16 ENCOUNTER — Other Ambulatory Visit: Payer: Self-pay

## 2019-01-16 DIAGNOSIS — E785 Hyperlipidemia, unspecified: Secondary | ICD-10-CM

## 2019-01-16 DIAGNOSIS — I10 Essential (primary) hypertension: Secondary | ICD-10-CM

## 2019-01-16 DIAGNOSIS — M1712 Unilateral primary osteoarthritis, left knee: Secondary | ICD-10-CM

## 2019-01-16 DIAGNOSIS — M62830 Muscle spasm of back: Secondary | ICD-10-CM

## 2019-01-16 MED ORDER — CYCLOBENZAPRINE HCL 5 MG PO TABS: 5.0000 mg | ORAL_TABLET | Freq: Every day | ORAL | 1 refills | Status: DC | PRN

## 2019-01-16 MED FILL — ?CYCLOBENZAPRINE 5 MG TABLE: 5 | 30 days supply | Qty: 30 | Fill #0

## 2019-01-16 NOTE — Progress Notes (Signed)
Pt states he is needing a muscle relaxer for his back. Pt states he used to take them but can't remember the name

## 2019-01-16 NOTE — Progress Notes (Signed)
Virtual Visit via Telephone Note Due to current restrictions/limitations of in-office visits due to the COVID-19 pandemic, this scheduled clinical appointment was converted to a telehealth visit  I connected with Damon Peck on 01/16/19 at 2:26 p.m by telephone and verified that I am speaking with the correct person using two identifiers. I am in my office.  The patient is at home.  Only the patient and myself participated in this encounter.  I discussed the limitations, risks, security and privacy concerns of performing an evaluation and management service by telephone and the availability of in person appointments. I also discussed with the patient that there may be a patient responsible charge related to this service. The patient expressed understanding and agreed to proceed.  History of Present Illness: [name], HL, obesity, OA LT knee.  Patient was last evaluated 10/07/2018   Pt c/o muscle spasms in his back since he started working as a [employer] for the past 3 months.  He states that he does a lot of bending down and standing up and by the end of the day his back is a little sore with spasms.  He was on low dose of Flexeril in the past which he found helpful.  He is requesting a refill on that.  Of note he is also on atorvastatin 10 mg for hyperlipidemia.  However patient states that the muscle spasm in his back did not start until he started this new job and he feels it is more related to the job done to the atorvastatin.  HYPERTENSION Currently taking: see medication list.  On last telephone visit we had increased the amlodipine to 10 mg daily.  He continues to take the metoprolol and hydrochlorothiazide Med Adherence: [x]  Yes    []  No Medication side effects: []  Yes    [x]  No Adherence with salt restriction: [x]  Yes    []  No Home Monitoring?: [x]  Yes    []  No Monitoring Frequency: A few times a week. Home BP results range: Blood pressure yesterday was 123/78 SOB? []  Yes     [x]  No Chest Pain?: []  Yes    [x]  No Leg swelling?: []  Yes    [x]  No Headaches?: []  Yes    [x]  No Dizziness? []  Yes    [x]  No Comments:   HL: I went over lab results from visit back in April.  He did get those results.  He reports compliance with atorvastatin.  Osteoarthritis of the left knee: He continues to take tramadol as needed.  Reports good results with tramadol.  He is functional.  Outpatient Encounter Medications as of 01/16/2019  Medication Sig  . amLODipine (NORVASC) 10 MG tablet Take 1 tablet (10 mg total) by mouth daily.  Marland Kitchen. atorvastatin (LIPITOR) 10 MG tablet Take 1 tablet (10 mg total) by mouth daily.  . fluticasone (FLONASE) 50 MCG/ACT nasal spray Place 2 sprays into both nostrils daily.  . hydrochlorothiazide (HYDRODIURIL) 25 MG tablet Take 1 tablet (25 mg total) by mouth daily.  Marland Kitchen. K-TAB 20 MEQ TBCR TAKE 1 TABLET BY MOUTH ONCE DAILY  . methocarbamol (ROBAXIN) 750 MG tablet Take 1-2 tablets (750-1,500 mg total) by mouth 3 (three) times daily as needed for muscle spasms.  . metoprolol succinate (TOPROL-XL) 100 MG 24 hr tablet Take 1 tablet (100 mg total) by mouth daily. Take with or immediately following a meal.  . nitroGLYCERIN (NITROGLYN) 2 % ointment Apply 0.5 inches topically 4 (four) times daily. (Patient not taking: Reported on 05/17/2017)  .  predniSONE (STERAPRED UNI-PAK 21 TAB) 10 MG (21) TBPK tablet Take by mouth daily. Take 6 tabs by mouth daily  for 2 days, then 5 tabs for 2 days, then 4 tabs for 2 days, then 3 tabs for 2 days, 2 tabs for 2 days, then 1 tab by mouth daily for 2 days (Patient not taking: Reported on 10/07/2018)  . traMADol (ULTRAM) 50 MG tablet TAKE 1 TABLET BY MOUTH EVERY 12 HOURS AS NEEDED   No facility-administered encounter medications on file as of 01/16/2019.     Observations/Objective: Results for orders placed or performed in visit on 10/07/18  CBC  Result Value Ref Range   WBC 7.7 3.4 - 10.8 x10E3/uL   RBC 5.12 4.14 - 5.80 x10E6/uL    Hemoglobin 14.1 13.0 - 17.7 g/dL   Hematocrit 42.4 37.5 - 51.0 %   MCV 83 79 - 97 fL   MCH 27.5 26.6 - 33.0 pg   MCHC 33.3 31.5 - 35.7 g/dL   RDW 13.2 11.6 - 15.4 %   Platelets 240 150 - 450 x10E3/uL  Comprehensive metabolic panel  Result Value Ref Range   Glucose 104 (H) 65 - 99 mg/dL   BUN 18 6 - 24 mg/dL   Creatinine, Ser 0.70 (L) 0.76 - 1.27 mg/dL   GFR calc non Af Amer 107 >59 mL/min/1.73   GFR calc Af Amer 124 >59 mL/min/1.73   BUN/Creatinine Ratio 26 (H) 9 - 20   Sodium 141 134 - 144 mmol/L   Potassium 4.4 3.5 - 5.2 mmol/L   Chloride 101 96 - 106 mmol/L   CO2 25 20 - 29 mmol/L   Calcium 9.2 8.7 - 10.2 mg/dL   Total Protein 7.0 6.0 - 8.5 g/dL   Albumin 4.4 3.8 - 4.9 g/dL   Globulin, Total 2.6 1.5 - 4.5 g/dL   Albumin/Globulin Ratio 1.7 1.2 - 2.2   Bilirubin Total 0.3 0.0 - 1.2 mg/dL   Alkaline Phosphatase 51 39 - 117 IU/L   AST 16 0 - 40 IU/L   ALT 27 0 - 44 IU/L  Lipid panel  Result Value Ref Range   Cholesterol, Total 162 100 - 199 mg/dL   Triglycerides 156 (H) 0 - 149 mg/dL   HDL 38 (L) >39 mg/dL   VLDL Cholesterol Cal 31 5 - 40 mg/dL   LDL Calculated 93 0 - 99 mg/dL   Chol/HDL Ratio 4.3 0.0 - 5.0 ratio     Assessment and Plan: 1. Essential hypertension Reported blood pressure reading is at goal of 130/80 or lower.  We will have him continue his current blood pressure medications and low-salt diet  2. Dyslipidemia Continue atorvastatin  3. Lumbar paraspinal muscle spasm Prescription sent to the pharmacy for low-dose of Flexeril to use as needed.  Patient warned that medication can cause drowsiness.  4. Osteoarthritis of left knee, unspecified osteoarthritis type Taking and tolerating tramadol.  He reports good relief.  He is functional on the medication.  Last prescription filled 12/2018.  He will need controlled substance prescribing agreement on his next visit with [place].   Follow Up Instructions: F/u in 3 mths   I discussed the assessment and treatment  plan with the patient. The patient was provided an opportunity to ask questions and all were answered. The patient agreed with the plan and demonstrated an understanding of the instructions.   The patient was advised to call back or seek an in-person evaluation if the symptoms worsen or if the condition fails  to improve as anticipated.  I provided 7  minutes of non-face-to-face time during this encounter.   Jonah Blueeborah Travonte Byard, MD

## 2019-02-06 MED FILL — ?HYDROCHLOROTHIAZIDE 25MG T: 25 | 30 days supply | Qty: 30 | Fill #4

## 2019-02-07 MED FILL — METOPROLOL SUCCINATE ER 100: 100 | 30 days supply | Qty: 30 | Fill #4

## 2019-02-09 MED FILL — METOPROLOL SUCCINATE ER 100: 100 | 30 days supply | Qty: 30 | Fill #5

## 2019-02-10 MED FILL — ?ATORVASTATIN 10 MG TABLET: 10 | 30 days supply | Qty: 30 | Fill #4

## 2019-02-14 MED FILL — ?AMLODIPINE BESYLATE 10 MG: 10 | 30 days supply | Qty: 30 | Fill #4

## 2019-02-17 ENCOUNTER — Other Ambulatory Visit: Payer: Self-pay | Admitting: Internal Medicine

## 2019-02-17 DIAGNOSIS — M1712 Unilateral primary osteoarthritis, left knee: Secondary | ICD-10-CM

## 2019-02-20 MED FILL — traMADol HCL 50 MG TABS: 50 | 30 days supply | Qty: 60 | Fill #0

## 2019-03-08 MED FILL — ?HYDROCHLOROTHIAZIDE 25MG T: 25 | 30 days supply | Qty: 30 | Fill #5

## 2019-03-08 MED FILL — ?AMLODIPINE BESYLATE 10 MG: 10 | 30 days supply | Qty: 30 | Fill #5

## 2019-03-08 MED FILL — ?ATORVASTATIN 10 MG TABLET: 10 | 30 days supply | Qty: 30 | Fill #5
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
## 2019-04-10 MED FILL — METOPROLOL SUCCINATE ER 100: 100 | 30 days supply | Qty: 30 | Fill #6

## 2019-04-10 MED FILL — ?HYDROCHLOROTHIAZIDE 25MG T: 25 | 30 days supply | Qty: 30 | Fill #6

## 2019-04-10 MED FILL — ?AMLODIPINE BESYLATE 10 MG: 10 | 30 days supply | Qty: 30 | Fill #6

## 2019-04-10 MED FILL — ?ATORVASTATIN 10 MG TABLET: 10 | 30 days supply | Qty: 30 | Fill #6

## 2019-04-20 ENCOUNTER — Ambulatory Visit: Payer: Self-pay | Attending: Internal Medicine | Admitting: Internal Medicine

## 2019-04-20 ENCOUNTER — Encounter: Payer: Self-pay | Admitting: Internal Medicine

## 2019-04-20 ENCOUNTER — Other Ambulatory Visit: Payer: Self-pay

## 2019-04-20 DIAGNOSIS — M1712 Unilateral primary osteoarthritis, left knee: Secondary | ICD-10-CM

## 2019-04-20 DIAGNOSIS — J302 Other seasonal allergic rhinitis: Secondary | ICD-10-CM

## 2019-04-20 DIAGNOSIS — Z2821 Immunization not carried out because of patient refusal: Secondary | ICD-10-CM

## 2019-04-20 DIAGNOSIS — E785 Hyperlipidemia, unspecified: Secondary | ICD-10-CM

## 2019-04-20 DIAGNOSIS — I10 Essential (primary) hypertension: Secondary | ICD-10-CM

## 2019-04-20 MED ORDER — TRAMADOL HCL 50 MG PO TABS: 50.0000 mg | ORAL_TABLET | Freq: Two times a day (BID) | ORAL | 0 refills | Status: DC | PRN

## 2019-04-20 MED ORDER — ATORVASTATIN CALCIUM 10 MG PO TABS: 10.0000 mg | ORAL_TABLET | Freq: Every day | ORAL | 3 refills | Status: DC

## 2019-04-20 MED ORDER — HYDROCHLOROTHIAZIDE 25 MG PO TABS: 25.0000 mg | ORAL_TABLET | Freq: Every day | ORAL | 2 refills | Status: DC

## 2019-04-20 MED ORDER — METOPROLOL SUCCINATE ER 100 MG PO TB24: 100.0000 mg | ORAL_TABLET | Freq: Every day | ORAL | 2 refills | Status: DC

## 2019-04-20 MED ORDER — AMLODIPINE BESYLATE 10 MG PO TABS: 10.0000 mg | ORAL_TABLET | Freq: Every day | ORAL | 2 refills | Status: DC

## 2019-04-20 MED ORDER — FLUTICASONE PROPIONATE 50 MCG/ACT NA SUSP: 2.0000 | Freq: Every day | NASAL | 6 refills | Status: DC

## 2019-04-20 NOTE — Progress Notes (Signed)
Virtual Visit via Telephone Note Due to current restrictions/limitations of in-office visits due to the COVID-19 pandemic, this scheduled clinical appointment was converted to a telehealth visit  I connected with Damon Peck on 04/20/19 at 4:20  p.m by telephone and verified that I am speaking with the correct person using two identifiers. I am in my office.  The patient is at home.  Only the patient and myself participated in this encounter.  I discussed the limitations, risks, security and privacy concerns of performing an evaluation and management service by telephone and the availability of in person appointments. I also discussed with the patient that there may be a patient responsible charge related to this service. The patient expressed understanding and agreed to proceed.   History of Present Illness: [name], HL, obesity, OA LT knee.    HYPERTENSION Currently taking: see medication list Med Adherence: [x]  Yes    []  No Medication side effects: []  Yes    [x]  No Adherence with salt restriction: [x]  Yes    []  No Home Monitoring?: [x]  Yes    []  No Monitoring Frequency:  3 x a wk Home BP results range: 130/78, SBP never over 135 SOB? []  Yes    [x]  No Chest Pain?: []  Yes    [x]  No Leg swelling?: []  Yes    [x]  No Headaches?: []  Yes    [x]  No Dizziness? []  Yes    [x]  No Comments:   HL:  Tolerating Lipitor.  Denies any muscle cramps or aches.  OA LT knee:    He tries to stay active.  Tramadol helps with the knee pain.  Requesting refill.  Denies any significant side effects from the medication.  Requesting refill on his allergy medications. Outpatient Encounter Medications as of 04/20/2019  Medication Sig  . amLODipine (NORVASC) 10 MG tablet Take 1 tablet (10 mg total) by mouth daily.  Marland Kitchen atorvastatin (LIPITOR) 10 MG tablet Take 1 tablet (10 mg total) by mouth daily.  . cyclobenzaprine (FLEXERIL) 5 MG tablet Take 1 tablet (5 mg total) by mouth daily as needed for  muscle spasms. This medication can cause drowsiness.  . fluticasone (FLONASE) 50 MCG/ACT nasal spray Place 2 sprays into both nostrils daily.  . hydrochlorothiazide (HYDRODIURIL) 25 MG tablet Take 1 tablet (25 mg total) by mouth daily.  Marland Kitchen K-TAB 20 MEQ TBCR TAKE 1 TABLET BY MOUTH ONCE DAILY  . methocarbamol (ROBAXIN) 750 MG tablet Take 1-2 tablets (750-1,500 mg total) by mouth 3 (three) times daily as needed for muscle spasms. (Patient not taking: Reported on 04/20/2019)  . metoprolol succinate (TOPROL-XL) 100 MG 24 hr tablet Take 1 tablet (100 mg total) by mouth daily. Take with or immediately following a meal.  . traMADol (ULTRAM) 50 MG tablet TAKE 1 TABLET BY MOUTH EVERY 12 HOURS AS NEEDED   No facility-administered encounter medications on file as of 04/20/2019.     Observations/Objective: No direct observation done as this was a telephone encounter  Assessment and Plan: 1. Essential hypertension Home blood pressure readings okay.  He will continue amlodipine, hydrochlorothiazide and metoprolol - amLODipine (NORVASC) 10 MG tablet; Take 1 tablet (10 mg total) by mouth daily.  Dispense: 90 tablet; Refill: 2 - hydrochlorothiazide (HYDRODIURIL) 25 MG tablet; Take 1 tablet (25 mg total) by mouth daily.  Dispense: 90 tablet; Refill: 2 - metoprolol succinate (TOPROL-XL) 100 MG 24 hr tablet; Take 1 tablet (100 mg total) by mouth daily. Take with or immediately following a meal.  Dispense: 90 tablet; Refill: 2  2. Dyslipidemia Continue atorvastatin - atorvastatin (LIPITOR) 10 MG tablet; Take 1 tablet (10 mg total) by mouth daily.  Dispense: 90 tablet; Refill: 3  3. Seasonal allergic rhinitis, unspecified trigger - fluticasone (FLONASE) 50 MCG/ACT nasal spray; Place 2 sprays into both nostrils daily.  Dispense: 16 g; Refill: 6  4. Osteoarthritis of left knee, unspecified osteoarthritis type Refill given on tramadol.  [place] controlled substance reporting system reviewed and is  appropriate.  Patient reports benefit from the medication without significant side effect. - traMADol (ULTRAM) 50 MG tablet; Take 1 tablet (50 mg total) by mouth every 12 (twelve) hours as needed.  Dispense: 60 tablet; Refill: 0  5. Influenza vaccination declined   Follow Up Instructions: 3 mths in-person   I discussed the assessment and treatment plan with the patient. The patient was provided an opportunity to ask questions and all were answered. The patient agreed with the plan and demonstrated an understanding of the instructions.   The patient was advised to call back or seek an in-person evaluation if the symptoms worsen or if the condition fails to improve as anticipated.  I provided  5 minutes of non-face-to-face time during this encounter.   [name], MD

## 2019-04-21 MED FILL — traMADol HCL 50 MG TABS: 50 | 30 days supply | Qty: 60 | Fill #0

## 2019-04-21 MED FILL — FLUTICASONE PROP 50 MCG SPR: 50 | 30 days supply | Qty: 16 | Fill #0

## 2019-04-24 ENCOUNTER — Ambulatory Visit: Payer: Self-pay | Attending: Family Medicine

## 2019-04-24 ENCOUNTER — Other Ambulatory Visit: Payer: Self-pay

## 2019-05-01 ENCOUNTER — Other Ambulatory Visit: Payer: Self-pay | Admitting: Internal Medicine

## 2019-05-01 DIAGNOSIS — M1712 Unilateral primary osteoarthritis, left knee: Secondary | ICD-10-CM

## 2019-05-08 MED FILL — METOPROLOL SUCCINATE ER 100: 100 | 30 days supply | Qty: 30 | Fill #7

## 2019-05-08 MED FILL — ?HYDROCHLOROTHIAZIDE 25MG T: 25 | 30 days supply | Qty: 30 | Fill #7

## 2019-05-15 MED FILL — ?AMLODIPINE BESYLATE 10 MG: 10 | 30 days supply | Qty: 30 | Fill #7

## 2019-05-15 MED FILL — ?ATORVASTATIN 10 MG TABLET: 10 | 30 days supply | Qty: 30 | Fill #7

## 2019-06-06 MED FILL — METOPROLOL SUCCINATE ER 100: 100 | 30 days supply | Qty: 30 | Fill #8

## 2019-06-06 MED FILL — ?HYDROCHLOROTHIAZIDE 25MG T: 25 | 30 days supply | Qty: 30 | Fill #8

## 2019-06-06 MED FILL — ?AMLODIPINE BESYLATE 10 MG: 10 | 30 days supply | Qty: 30 | Fill #8

## 2019-06-12 MED FILL — ?ATORVASTATIN 10 MG TABLET: 10 | 30 days supply | Qty: 30 | Fill #8

## 2019-07-03 MED FILL — ?HYDROCHLOROTHIAZIDE 25MG T: 25 | 30 days supply | Qty: 30 | Fill #0

## 2019-07-03 MED FILL — ?METOPROLOL SUCC ER 100MG T: 100 | 30 days supply | Qty: 30 | Fill #0

## 2019-07-12 MED FILL — ?ATORVASTATIN 10 MG TABLET: 10 | 30 days supply | Qty: 30 | Fill #9

## 2019-07-14 MED FILL — ?AMLODIPINE BESYLATE 10 MG: 10 | 30 days supply | Qty: 30 | Fill #0

## 2019-08-07 MED FILL — METOPROLOL SUCCINATE ER 100: 100 | 30 days supply | Qty: 30 | Fill #1

## 2019-08-07 MED FILL — ?HYDROCHLOROTHIAZIDE 25MG T: 25 | 30 days supply | Qty: 30 | Fill #1

## 2019-08-15 MED FILL — ?ATORVASTATIN 10 MG TABLET: 10 | 30 days supply | Qty: 30 | Fill #10

## 2019-08-15 MED FILL — AMLODIPINE BESYLATE 10 MG T: 10 | 30 days supply | Qty: 30 | Fill #1

## 2019-08-25 ENCOUNTER — Other Ambulatory Visit: Payer: Self-pay | Admitting: Internal Medicine

## 2019-08-25 DIAGNOSIS — M1712 Unilateral primary osteoarthritis, left knee: Secondary | ICD-10-CM

## 2019-08-28 MED FILL — traMADol HCL 50 MG TABS: 50 | 30 days supply | Qty: 60 | Fill #0

## 2019-09-05 MED FILL — METOPROLOL SUCCINATE ER 100: 100 | 30 days supply | Qty: 30 | Fill #2

## 2019-09-05 MED FILL — ?HYDROCHLOROTHIAZIDE 25MG T: 25 | 30 days supply | Qty: 30 | Fill #2

## 2019-09-14 MED FILL — AMLODIPINE BESYLATE 10 MG T: 10 | 30 days supply | Qty: 30 | Fill #2

## 2019-09-14 MED FILL — ?ATORVASTATIN CALCIUM 10MG: 10 | 30 days supply | Qty: 30 | Fill #11

## 2019-10-09 MED FILL — METOPROLOL SUCCINATE ER 100: 100 | 30 days supply | Qty: 30 | Fill #3

## 2019-10-09 MED FILL — ?HYDROCHLOROTHIAZIDE 25MG T: 25 | 30 days supply | Qty: 30 | Fill #3

## 2019-10-09 MED FILL — AMLODIPINE BESYLATE 10 MG T: 10 | 30 days supply | Qty: 30 | Fill #3

## 2019-10-11 ENCOUNTER — Encounter (HOSPITAL_COMMUNITY): Payer: Self-pay

## 2019-10-11 ENCOUNTER — Ambulatory Visit (HOSPITAL_COMMUNITY)
Admission: EM | Admit: 2019-10-11 | Discharge: 2019-10-11 | Disposition: A | Discharge status: Home / Self Care | Payer: Self-pay | Attending: Emergency Medicine | Admitting: Emergency Medicine

## 2019-10-11 ENCOUNTER — Other Ambulatory Visit: Payer: Self-pay

## 2019-10-11 ENCOUNTER — Ambulatory Visit (INDEPENDENT_AMBULATORY_CARE_PROVIDER_SITE_OTHER): Payer: Self-pay

## 2019-10-11 DIAGNOSIS — R42 Dizziness and giddiness: Secondary | ICD-10-CM

## 2019-10-11 DIAGNOSIS — M25511 Pain in right shoulder: Secondary | ICD-10-CM

## 2019-10-11 MED ORDER — KETOROLAC TROMETHAMINE 30 MG/ML IJ SOLN
30.0000 mg | Freq: Once | INTRAMUSCULAR | Status: AC
  Administered 2019-10-11: 16:00:00 30 mg via INTRAMUSCULAR

## 2019-10-11 MED ORDER — PREDNISONE 20 MG PO TABS: 20.0000 mg | ORAL_TABLET | Freq: Every day | ORAL | 0 refills | Status: AC

## 2019-10-11 MED ORDER — KETOROLAC TROMETHAMINE 30 MG/ML IJ SOLN
INTRAMUSCULAR | Status: AC
  Filled 2019-10-11: qty 1

## 2019-10-11 MED ORDER — CYCLOBENZAPRINE HCL 5 MG PO TABS: 5.0000 mg | ORAL_TABLET | Freq: Two times a day (BID) | ORAL | 0 refills | Status: DC | PRN

## 2019-10-11 NOTE — ED Notes (Signed)
Pt was called 3 times without answer.

## 2019-10-11 NOTE — ED Triage Notes (Addendum)
Pt is here with right shoulder pain and some dizziness this started yesterday afternoon. Pt has taken Advil, tramadol to relieve discomfort.

## 2019-10-11 NOTE — Discharge Instructions (Addendum)
Please follow up with sports medicine/orthopedics for further evaluation of shoulder to further evaluation pain, weakness and decreased range of motion  Begin prednisone daily for the next week Gentle range of motion exercises You may use flexeril as needed to help with pain. This is a muscle relaxer and causes sedation- please use only at bedtime or when you will be home and not have to drive/work

## 2019-10-12 MED FILL — predniSONE 20 MG TABS: 20 | 7 days supply | Qty: 7 | Fill #0

## 2019-10-12 MED FILL — CYCLOBENZAPRINE 5 MG TABLET: 5 | 6 days supply | Qty: 24 | Fill #0

## 2019-10-12 MED FILL — ?ATORVASTATIN CALCIUM 10MG: 10 | 30 days supply | Qty: 30 | Fill #0

## 2019-10-12 NOTE — ED Provider Notes (Signed)
[facility]    CSN: [id number] Arrival date & time: 10/11/19  1402      History   Chief Complaint Chief Complaint  Patient presents with  . Shoulder Pain  . Dizziness    HPI Damon Peck is a 54 y.o. male history of hypertension, hyperlipidemia, GERD, presenting today for evaluation of right shoulder pain and dizziness.  Patient notes that over the past couple days he has had worsening shoulder pain in his right shoulder.  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  Reports pain is very severe and has difficulty doing any lifting motions of his shoulder.  Denies history of prior shoulder injury or problems.  He has used Advil and tramadol.  He also reports associated dizziness since onset of symptoms.  Describes his dizziness as feeling very fatigued and tired.  Reports poor sleep due to discomfort.  He denies chest pain or shortness of breath.  Denies feeling lightheaded.  Denies room spinning sensation.  Denies vision changes or headache.  Patient and son concerned about his heart.  Denies any left-sided symptoms.  Has history of hypertension, but reports this is controlled with medicine.  Denies history of diabetes.  HPI  Past Medical History:  Diagnosis Date  . Hyperlipidemia 06/02/2018  . Hypertension     Patient Active Problem List   Diagnosis Date Noted  . Abnormal CT of the head 05/30/2018  . Osteoarthritis of left knee 09/14/2017  . It band syndrome, right 12/30/2016  . Knee mass, left 12/09/2016  . Obesity (BMI 30-39.9) 08/06/2016  . Gastroesophageal reflux disease 04/26/2016  . Midline low back pain without sciatica 08/09/2014  . Dyslipidemia 04/24/2013  . HTN (hypertension) 12/15/2012    Past Surgical History:  Procedure Laterality Date  . COLONOSCOPY WITH PROPOFOL N/A 02/11/2015   Procedure: COLONOSCOPY WITH PROPOFOL;  Surgeon: Charolett Bumpers, MD;  Location: [facility];  Service:  Endoscopy;  Laterality: N/A;       Home Medications    Prior to Admission medications   Medication Sig Start Date End Date Taking? Authorizing Provider  amLODipine (NORVASC) 10 MG tablet Take 1 tablet (10 mg total) by mouth daily. 04/20/19   Marcine Matar, MD  atorvastatin (LIPITOR) 10 MG tablet Take 1 tablet (10 mg total) by mouth daily. 04/20/19   Marcine Matar, MD  cyclobenzaprine (FLEXERIL) 5 MG tablet Take 1-2 tablets (5-10 mg total) by mouth 2 (two) times daily as needed for muscle spasms. 10/11/19   Mikaeel Petrow C, PA-C  fluticasone (FLONASE) 50 MCG/ACT nasal spray Place 2 sprays into both nostrils daily. 04/20/19   Marcine Matar, MD  hydrochlorothiazide (HYDRODIURIL) 25 MG tablet Take 1 tablet (25 mg total) by mouth daily. 04/20/19   Marcine Matar, MD  K-TAB 20 MEQ TBCR TAKE 1 TABLET BY MOUTH ONCE DAILY 09/30/18   Hoy Register, MD  metoprolol succinate (TOPROL-XL) 100 MG 24 hr tablet Take 1 tablet (100 mg total) by mouth daily. Take with or immediately following a meal. 04/20/19   Marcine Matar, MD  predniSONE (DELTASONE) 20 MG tablet Take 1 tablet (20 mg total) by mouth daily with breakfast for 7 days. 10/11/19 10/18/19  Oliverio Cho C, PA-C  traMADol (ULTRAM) 50 MG tablet TAKE 1 TABLET (50 MG TOTAL) BY MOUTH EVERY 12 (TWELVE) HOURS AS NEEDED. 08/27/19   Marcine Matar, MD    Family History Family History  Problem  Relation Age of Onset  . Diabetes Mother   . Hypertension Mother   . Diabetes Father   . Hypertension Father     Social History Social History   Tobacco Use  . Smoking status: Never Smoker  . Smokeless tobacco: Never Used  Substance Use Topics  . Alcohol use: No  . Drug use: No     Allergies   Patient has no known allergies.   Review of Systems Review of Systems  Constitutional: Positive for fatigue. Negative for fever.  Eyes: Negative for redness, itching and visual disturbance.  Respiratory: Negative for shortness  of breath.   Cardiovascular: Negative for chest pain and leg swelling.  Gastrointestinal: Negative for nausea and vomiting.  Musculoskeletal: Positive for arthralgias, myalgias and neck pain.  Skin: Negative for color change, rash and wound.  Neurological: Positive for dizziness. Negative for syncope, weakness, light-headedness and headaches.     Physical Exam Triage Vital Signs ED Triage Vitals  Enc Vitals Group     BP 10/11/19 1445 (!) 149/82     Pulse Rate 10/11/19 1445 73     Resp 10/11/19 1445 19     Temp 10/11/19 1445 98.5 F (36.9 C)     Temp Source 10/11/19 1445 Oral     SpO2 10/11/19 1445 94 %     Weight 10/11/19 1446 248 lb (112.5 kg)     Height --      Head Circumference --      Peak Flow --      Pain Score 10/11/19 1446 9     Pain Loc --      Pain Edu? --      Excl. in [place]? --    No data found.  Updated Vital Signs BP (!) 149/82 (BP Location: Right Arm)   Pulse 73   Temp 98.5 F (36.9 C) (Oral)   Resp 19   Wt 248 lb (112.5 kg)   SpO2 94%   BMI 34.59 kg/m   Visual Acuity Right Eye Distance:   Left Eye Distance:   Bilateral Distance:    Right Eye Near:   Left Eye Near:    Bilateral Near:     Physical Exam Vitals and nursing note reviewed.  Constitutional:      Appearance: He is well-developed.     Comments: No acute distress  HENT:     Head: Normocephalic and atraumatic.     Nose: Nose normal.  Eyes:     Extraocular Movements: Extraocular movements intact.     Conjunctiva/sclera: Conjunctivae normal.     Pupils: Pupils are equal, round, and reactive to light.  Cardiovascular:     Rate and Rhythm: Normal rate and regular rhythm.  Pulmonary:     Effort: Pulmonary effort is normal. No respiratory distress.     Comments: Breathing comfortably at rest, CTABL, no wheezing, rales or other adventitious sounds auscultated Abdominal:     General: There is no distension.  Musculoskeletal:        General: Normal range of motion.     Cervical back:  Neck supple.     Comments: Right shoulder: No obvious swelling discoloration or change to skin over shoulder area, nontender to palpation along length of clavicle, increased tenderness at [facility] joint, nontender along scapular spine, tenderness in superior trapezius extending into proximal humeral area  Limited active and passive range of motion beyond approximately 30 degrees abduction.  Slight weakness and discomfort with resisted external rotation.  Unable to perform large portion  of special test due to patient tolerance.  Radial pulse 2+ bilaterally, grip strength 5/5 and equal bilaterally  Neck: Nontender to palpation of cervical spine midline, minimal tenderness throughout right cervical musculature  Skin:    General: Skin is warm and dry.  Neurological:     Mental Status: He is alert and oriented to person, place, and time.      UC Treatments / Results  Labs (all labs ordered are listed, but only abnormal results are displayed) Labs Reviewed - No data to display  EKG   Radiology DG Shoulder Right  Result Date: 10/11/2019 CLINICAL DATA:  54 year old male EXAM: RIGHT SHOULDER - 2+ VIEW COMPARISON:  None. FINDINGS: There is no acute fracture or dislocation. The bones are well mineralized. There is linear calcification lateral to the humeral head likely involving the insertion of the supraspinatus muscle and suggestive of calcific tendinopathy. Clinical correlation is recommended. The soft tissues are unremarkable. IMPRESSION: 1. No acute fracture or dislocation. 2. 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 MRI may provide better evaluation if clinically indicated. Electronically Signed   By: Elgie Collard M.D.   On: 10/11/2019 16:18    Procedures Procedures (including critical care time)  Medications Ordered in UC Medications  ketorolac (TORADOL) 30 MG/ML injection 30 mg (30 mg Intramuscular Given 10/11/19 1602)    Initial  Impression / Assessment and Plan / UC Course  I have reviewed the triage vital signs and the nursing notes.  Pertinent labs & imaging results that were available during my care of the patient were reviewed by me and considered in my medical decision making (see chart for details).     Given decreased range of motion with some weakness, concerning for possible frozen shoulder versus rotator cuff tendinopathy.  X-ray showing calcifications of likely supraspinatus tendon.  Treating with course of prednisone, Toradol provided in clinic prior to discharge.  May continue tramadol.  Flexeril to help with muscular tension within the neck/back contributing to discomfort and trial to help with sleep at nighttime.  Recommending follow-up with sports medicine/Ortho for further evaluation and imaging of shoulder as warranted.  Discussed trying to perform range of motion exercises at home to prevent further stiffness and regain ROM.  EKG normal sinus rhythm with first-degree AV block, no prior EKG to compare to.  Discussed I do not suspect this is causing his fatigue, usually asymptomatic and requires monitoring.  Recommended follow-up with PCP/cardiology.  Has never had a stress test.  Discussed strict return precautions. Patient verbalized understanding and is agreeable with plan.  Final Clinical Impressions(s) / UC Diagnoses   Final diagnoses:  Acute pain of right shoulder  Dizziness     Discharge Instructions     Please follow up with sports medicine/orthopedics for further evaluation of shoulder to further evaluation pain, weakness and decreased range of motion  Begin prednisone daily for the next week Gentle range of motion exercises You may use flexeril as needed to help with pain. This is a muscle relaxer and causes sedation- please use only at bedtime or when you will be home and not have to [facility]       ED Prescriptions    Medication Sig Dispense Auth. Provider   cyclobenzaprine  (FLEXERIL) 5 MG tablet Take 1-2 tablets (5-10 mg total) by mouth 2 (two) times daily as needed for muscle spasms. 24 tablet Sherril Heyward C, PA-C   predniSONE (DELTASONE) 20 MG tablet Take 1 tablet (20 mg total)  by mouth daily with breakfast for 7 days. 7 tablet Ryleigh Esqueda, Mabie C, PA-C     PDMP not reviewed this encounter.   Sharyon Cable [employer] C, PA-C 10/12/19 1008

## 2019-11-03 MED FILL — METOPROLOL SUCCINATE ER 100: 100 | 30 days supply | Qty: 30 | Fill #4

## 2019-11-03 MED FILL — AMLODIPINE BESYLATE 10 MG T: 10 | 30 days supply | Qty: 30 | Fill #4

## 2019-11-03 MED FILL — HYDROCHLOROTHIAZIDE 25 MG T: 25 | 30 days supply | Qty: 30 | Fill #4

## 2019-11-09 MED FILL — ?ATORVASTATIN CALCIUM 10MG: 10 | 30 days supply | Qty: 30 | Fill #1

## 2019-12-07 MED FILL — ?ATORVASTATIN 10 MG TABLET: 10 | 30 days supply | Qty: 30 | Fill #2

## 2019-12-07 MED FILL — HYDROCHLOROTHIAZIDE 25 MG T: 25 | 30 days supply | Qty: 30 | Fill #5

## 2019-12-07 MED FILL — METOPROLOL SUCCINATE ER 100: 100 | 30 days supply | Qty: 30 | Fill #5

## 2019-12-07 MED FILL — ?AMLODIPINE BESYL 10MG TABL: 10 | 30 days supply | Qty: 30 | Fill #5

## 2019-12-12 NOTE — Progress Notes (Signed)
Patient ID: Damon Peck, male    DOB: 05-08-1966  MRN: [id number]  CC: High blood pressure   Subjective: Damon Peck is a 54 y.o. male with history of hypertension, GERD, osteoarthritis of left knee, dyslipidemia, midline low back pain without sciatica, and obesity who presents for high blood pressure follow-up.  1. HYPERTENSION FOLLOW-UP:  Currently taking: see medication list Med Adherence: [x]  Yes    []  No Medication side effects: []  Yes    [x]  No Adherence with salt restriction: [x]  Yes    []  No Exercise: Yes [x]  No []  Home Monitoring?: [x]  Yes    []  No Monitoring Frequency: [x]  Yes    []  No 101-130/75-80 Home BP results range: [x]  Yes    []  No Smoking []  Yes [x]  No SOB? []  Yes    [x]  No Chest Pain?: []  Yes    [x]  No Leg swelling?: []  Yes    [x]  No Headaches?: []  Yes    [x]  No Dizziness? []  Yes    [x]  No Comments:  Last visit 04/20/2019 with Dr. Wynetta Emery. During that encounter Amlodipine, Hydrochlorothiazide, and Metoprolol continued.   2. DYSLIPIDEMIA FOLLOW-UP: Are you fasting today: No, had sandwich and a banana  Satisfied with current treatment?  yes Side effects:  yes, muscle aches in right arm and legs on rare instances Medication compliance: good compliance Past cholesterol meds: atorvastain (lipitor) Supplements: none Aspirin:  yes, 81 mg/daily The 10-year ASCVD risk score Mikey Bussing DC Jr., et al., 2013) is: 7.3%   Values used to calculate the score:     Age: 23 years     Sex: Male     Is Non-Hispanic African American: No     Diabetic: No     Tobacco smoker: No     Systolic Blood Pressure: 774 mmHg     Is BP treated: Yes     HDL Cholesterol: 38 mg/dL     Total Cholesterol: 162 mg/dL Chest pain:  no Coronary artery disease:  no  Last visit 04/20/2019 with Dr. Wynetta Emery. During that encounter Atorvastatin continued.   3. 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 Onset: gradual  Severity: 9/10  Quality:  sharp Frequency: intermittent Radiation: no Aggravating factors: prolonged walking and standing  Alleviating factors: Meloxicam 15 mg and rest, reports he has tried some of his wife's Meloxicam prescription and it works, requesting Meloxicam for himself Status: stable Treatments attempted: Meloxicam 15 mg and rest  Relief with NSAIDs?:  significant with Meloxicam Weakness with weight bearing or walking: no Morning stiffness: no Swelling: no Redness: no Bruising: no Paresthesias / decreased sensation: no  Fevers:no   Patient Active Problem List   Diagnosis Date Noted  . Abnormal CT of the head 05/30/2018  . Osteoarthritis of left knee 09/14/2017  . It band syndrome, right 12/30/2016  . Knee mass, left 12/09/2016  . Obesity (BMI 30-39.9) 08/06/2016  . Gastroesophageal reflux disease 04/26/2016  . Midline low back pain without sciatica 08/09/2014  . Dyslipidemia 04/24/2013  . HTN (hypertension) 12/15/2012     Current Outpatient Medications on File Prior to Visit  Medication Sig Dispense Refill  . amLODipine (NORVASC) 10 MG tablet Take 1 tablet (10 mg total) by mouth daily. 90 tablet 2  . atorvastatin (LIPITOR) 10 MG tablet Take 1 tablet (10 mg total) by mouth daily. 90 tablet 3  . cyclobenzaprine (FLEXERIL) 5 MG tablet  Take 1-2 tablets (5-10 mg total) by mouth 2 (two) times daily as needed for muscle spasms. 24 tablet 0  . fluticasone (FLONASE) 50 MCG/ACT nasal spray Place 2 sprays into both nostrils daily. 16 g 6  . hydrochlorothiazide (HYDRODIURIL) 25 MG tablet Take 1 tablet (25 mg total) by mouth daily. 90 tablet 2  . K-TAB 20 MEQ TBCR TAKE 1 TABLET BY MOUTH ONCE DAILY 90 tablet 0  . metoprolol succinate (TOPROL-XL) 100 MG 24 hr tablet Take 1 tablet (100 mg total) by mouth daily. Take with or immediately following a meal. 90 tablet 2  . traMADol (ULTRAM) 50 MG tablet TAKE 1 TABLET (50 MG TOTAL) BY MOUTH EVERY 12  (TWELVE) HOURS AS NEEDED. 60 tablet 0   No current facility-administered medications on file prior to visit.    No Known Allergies  Social History   Socioeconomic History  . Marital status: Married    Spouse name: Not on file  . Number of children: Not on file  . Years of education: Not on file  . Highest education level: Not on file  Occupational History  . Not on file  Tobacco Use  . Smoking status: Never Smoker  . Smokeless tobacco: Never Used  Substance and Sexual Activity  . Alcohol use: No  . Drug use: No  . Sexual activity: Yes    Birth control/protection: Condom  Other Topics Concern  . Not on file  Social History Narrative  . Not on file   Social Determinants of Health   Financial Resource Strain:   . Difficulty of Paying Living Expenses:   Food Insecurity:   . Worried About Charity fundraiser in the Last Year:   . Arboriculturist in the Last Year:   Transportation Needs:   . Film/video editor (Medical):   Marland Kitchen Lack of Transportation (Non-Medical):   Physical Activity:   . Days of Exercise per Week:   . Minutes of Exercise per Session:   Stress:   . Feeling of Stress :   Social Connections:   . Frequency of Communication with Friends and Family:   . Frequency of Social Gatherings with Friends and Family:   . Attends Religious Services:   . Active Member of Clubs or Organizations:   . Attends Archivist Meetings:   Marland Kitchen Marital Status:   Intimate Partner Violence:   . Fear of Current or Ex-Partner:   . Emotionally Abused:   Marland Kitchen Physically Abused:   . Sexually Abused:     Family History  Problem Relation Age of Onset  . Diabetes Mother   . Hypertension Mother   . Diabetes Father   . Hypertension Father     Past Surgical History:  Procedure Laterality Date  . COLONOSCOPY WITH PROPOFOL N/A 02/11/2015   Procedure: COLONOSCOPY WITH PROPOFOL;  Surgeon: Garlan Fair, MD;  Location: [facility];  Service: Endoscopy;  Laterality: N/A;     ROS: Review of Systems Negative except as stated above  PHYSICAL EXAM: Vitals with BMI 12/15/2019 12/15/2019 10/11/2019  Height - - -  Weight - 260 lbs 10 oz 248 lbs  BMI - - -  Systolic 341 962 229  Diastolic 90 90 82  Pulse - 69 73  SpO2- 96%, room air  Temperature- 99.37F, oral  Physical Exam General appearance - alert, well appearing, and in no distress and oriented to person, place, and time Mental status - alert, oriented to person, place, and time, normal mood, behavior,  speech, dress, motor activity, and thought processes Neck - supple, no significant adenopathy Lymphatics - no palpable lymphadenopathy, no hepatosplenomegaly Chest - clear to auscultation, no wheezes, rales or rhonchi, symmetric air entry, no tachypnea, retractions or cyanosis Heart - normal rate, regular rhythm, normal S1, S2, no murmurs, rubs, clicks or gallops Neurological - alert, oriented, normal speech, no focal findings or movement disorder noted, neck supple without rigidity, cranial nerves II through XII intact, funduscopic exam normal, discs flat and sharp, DTR's normal and symmetric, motor and sensory grossly normal bilaterally, normal muscle tone, no tremors, strength 5/5, Romberg sign negative, normal gait and station Musculoskeletal - no joint tenderness, deformity or swelling, no muscular tenderness noted, full range of motion without pain Extremities - peripheral pulses normal, no pedal edema, no clubbing or cyanosis Skin - normal coloration and turgor, no rashes, no suspicious skin lesions noted  ASSESSMENT AND PLAN: 1. Essential hypertension: -Blood pressure not at goal today. Patient asymptomatic and not in distress.  -Patient reports he is taking Hydrochlorothiazide, Amlodipine, and Metoprolol as prescribed without missing doses.  -Reports blood pressures at home average 101-130/75-80.  -Will continue current blood pressure medications as prescribed.  -CMP to evaluate kidney function,  liver function, and electrolyte balance.  CBC to evaluate blood count and anemia. -Counseled on blood pressure goal of less than 130/80, low-sodium, DASH diet, medication compliance, 150 minutes of moderate intensity exercise per week. Discussed medication compliance, adverse effects. -Follow-up with primary physician in 3 months or sooner if needed.  - CMP14+EGFR - CBC With Differential - hydrochlorothiazide (HYDRODIURIL) 25 MG tablet; Take 1 tablet (25 mg total) by mouth daily.  Dispense: 90 tablet; Refill: 0 - amLODipine (NORVASC) 10 MG tablet; Take 1 tablet (10 mg total) by mouth daily.  Dispense: 90 tablet; Refill: 0 - metoprolol succinate (TOPROL-XL) 100 MG 24 hr tablet; Take 1 tablet (100 mg total) by mouth daily. Take with or immediately following a meal.  Dispense: 90 tablet; Refill: 0  2. Dyslipidemia: -Continue Atorvastatin as prescribed.  -Practice low-fat, low-sodium, heart-healthy diet and at least 30 minutes of moderate intensity exercise daily as tolerated.  -Lipid panel today to evaluate cholesterol. - Lipid panel - atorvastatin (LIPITOR) 10 MG tablet; Take 1 tablet (10 mg total) by mouth daily.  Dispense: 90 tablet; Refill: 0  3. 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  -Heel pain relieved with resting and does not radiate to the calf muscle. -Will try course of low-dose Meloxicam for pain management as patient reports he has taken this and it has helped.  -Counseled patient if symptoms do not improve in 3 to 4 weeks to notify his primary physician for further evaluation and management. Patient agreeable. - meloxicam (MOBIC) 7.5 MG tablet; Take 1 tablet (7.5 mg total) by mouth daily.  Dispense: 30 tablet; Refill: 0  Patient was given the opportunity to ask questions.  Patient verbalized understanding of the plan and was able to repeat key elements of the plan. Patient was given clear  instructions to go to Emergency Department or return to medical center if symptoms don't improve, worsen, or new problems develop.The patient verbalized understanding.   Camillia Herter, NP

## 2019-12-15 ENCOUNTER — Ambulatory Visit: Payer: Self-pay | Attending: Family | Admitting: Family

## 2019-12-15 ENCOUNTER — Encounter: Payer: Self-pay | Admitting: Family

## 2019-12-15 ENCOUNTER — Other Ambulatory Visit: Payer: Self-pay

## 2019-12-15 ENCOUNTER — Other Ambulatory Visit: Payer: Self-pay | Admitting: Family

## 2019-12-15 VITALS — BP 136/90 | HR 69 | Temp 99.1°F | Resp 16 | Wt 260.6 lb

## 2019-12-15 DIAGNOSIS — M79672 Pain in left foot: Secondary | ICD-10-CM

## 2019-12-15 DIAGNOSIS — E785 Hyperlipidemia, unspecified: Secondary | ICD-10-CM

## 2019-12-15 DIAGNOSIS — I10 Essential (primary) hypertension: Secondary | ICD-10-CM

## 2019-12-15 MED ORDER — MELOXICAM 7.5 MG PO TABS: 7.5000 mg | ORAL_TABLET | Freq: Every day | ORAL | 0 refills | Status: DC

## 2019-12-15 MED ORDER — METOPROLOL SUCCINATE ER 100 MG PO TB24: 100.0000 mg | ORAL_TABLET | Freq: Every day | ORAL | 0 refills | Status: DC

## 2019-12-15 MED ORDER — AMLODIPINE BESYLATE 10 MG PO TABS: 10.0000 mg | ORAL_TABLET | Freq: Every day | ORAL | 0 refills | Status: DC

## 2019-12-15 MED ORDER — ATORVASTATIN CALCIUM 10 MG PO TABS: 10.0000 mg | ORAL_TABLET | Freq: Every day | ORAL | 0 refills | Status: DC

## 2019-12-15 MED ORDER — HYDROCHLOROTHIAZIDE 25 MG PO TABS: 25.0000 mg | ORAL_TABLET | Freq: Every day | ORAL | 0 refills | Status: DC

## 2019-12-15 NOTE — Patient Instructions (Signed)
Continue Amlodipine, Metoprolol, and Hydrochlorothiazide for blood pressure. Continue Atorvastatin for cholesterol. Labs today. Follow-up in 3 months or sooner with primary physician. Hypertension, Adult Hypertension is another name for high blood pressure. High blood pressure forces your heart to work harder to pump blood. This can cause problems over time. There are two numbers in a blood pressure reading. There is a top number (systolic) over a bottom number (diastolic). It is best to have a blood pressure that is below 120/80. Healthy choices can help lower your blood pressure, or you may need medicine to help lower it. What are the causes? The cause of this condition is not known. Some conditions may be related to high blood pressure. What increases the risk?  Smoking.  Having type 2 diabetes mellitus, high cholesterol, or both.  Not getting enough exercise or physical activity.  Being overweight.  Having too much fat, sugar, calories, or salt (sodium) in your diet.  Drinking too much alcohol.  Having long-term (chronic) kidney disease.  Having a family history of high blood pressure.  Age. Risk increases with age.  Race. You may be at higher risk if you are African American.  Gender. Men are at higher risk than women before age 81. After age 63, women are at higher risk than men.  Having obstructive sleep apnea.  Stress. What are the signs or symptoms?  High blood pressure may not cause symptoms. Very high blood pressure (hypertensive crisis) may cause: ? Headache. ? Feelings of worry or nervousness (anxiety). ? Shortness of breath. ? Nosebleed. ? A feeling of being sick to your stomach (nausea). ? Throwing up (vomiting). ? Changes in how you see. ? Very bad chest pain. ? Seizures. How is this treated?  This condition is treated by making healthy lifestyle changes, such as: ? Eating healthy foods. ? Exercising more. ? Drinking less alcohol.  Your health care  provider may prescribe medicine if lifestyle changes are not enough to get your blood pressure under control, and if: ? Your top number is above 130. ? Your bottom number is above 80.  Your personal target blood pressure may vary. Follow these instructions at home: Eating and drinking   If told, follow the DASH eating plan. To follow this plan: ? Fill one half of your plate at each meal with fruits and vegetables. ? Fill one fourth of your plate at each meal with whole grains. Whole grains include whole-wheat pasta, brown rice, and whole-grain bread. ? Eat or drink low-fat dairy products, such as skim milk or low-fat yogurt. ? Fill one fourth of your plate at each meal with low-fat (lean) proteins. Low-fat proteins include fish, chicken without skin, eggs, beans, and tofu. ? Avoid fatty meat, cured and processed meat, or chicken with skin. ? Avoid pre-made or processed food.  Eat less than 1,500 mg of salt each day.  Do not drink alcohol if: ? Your doctor tells you not to drink. ? You are pregnant, may be pregnant, or are planning to become pregnant.  If you drink alcohol: ? Limit how much you use to:  0-1 drink a day for women.  0-2 drinks a day for men. ? Be aware of how much alcohol is in your drink. In the [place] one drink equals one 12 oz bottle of beer (355 mL), one 5 oz glass of wine (148 mL), or one 1 oz glass of hard liquor (44 mL). Lifestyle   Work with your doctor to stay at a healthy weight  or to lose weight. Ask your doctor what the best weight is for you.  Get at least 30 minutes of exercise most days of the week. This may include walking, swimming, or biking.  Get at least 30 minutes of exercise that strengthens your muscles (resistance exercise) at least 3 days a week. This may include lifting weights or doing Pilates.  Do not use any products that contain nicotine or tobacco, such as cigarettes, e-cigarettes, and chewing tobacco. If you need help quitting, ask  your doctor.  Check your blood pressure at home as told by your doctor.  Keep all follow-up visits as told by your doctor. This is important. Medicines  Take over-the-counter and prescription medicines only as told by your doctor. Follow directions carefully.  Do not skip doses of blood pressure medicine. The medicine does not work as well if you skip doses. Skipping doses also puts you at risk for problems.  Ask your doctor about side effects or reactions to medicines that you should watch for. Contact a doctor if you:  Think you are having a reaction to the medicine you are taking.  Have headaches that keep coming back (recurring).  Feel dizzy.  Have swelling in your ankles.  Have trouble with your vision. Get help right away if you:  Get a very bad headache.  Start to feel mixed up (confused).  Feel weak or numb.  Feel faint.  Have very bad pain in your: ? Chest. ? Belly (abdomen).  Throw up more than once.  Have trouble breathing. Summary  Hypertension is another name for high blood pressure.  High blood pressure forces your heart to work harder to pump blood.  For most people, a normal blood pressure is less than 120/80.  Making healthy choices can help lower blood pressure. If your blood pressure does not get lower with healthy choices, you may need to take medicine. This information is not intended to replace advice given to you by your health care provider. Make sure you discuss any questions you have with your health care provider. Document Revised: 03/02/2018 Document Reviewed: 03/02/2018 Elsevier Patient Education  2020 [employer].

## 2019-12-16 LAB — CMP14+EGFR
ALT: 23 IU/L (ref 0–44)
AST: 18 IU/L (ref 0–40)
Albumin/Globulin Ratio: 1.6 (ref 1.2–2.2)
Albumin: 4.6 g/dL (ref 3.8–4.9)
Alkaline Phosphatase: 55 IU/L (ref 48–121)
BUN/Creatinine Ratio: 22 — ABNORMAL HIGH (ref 9–20)
BUN: 17 mg/dL (ref 6–24)
Bilirubin Total: 0.3 mg/dL (ref 0.0–1.2)
CO2: 26 mmol/L (ref 20–29)
Calcium: 9.7 mg/dL (ref 8.7–10.2)
Chloride: 100 mmol/L (ref 96–106)
Creatinine, Ser: 0.77 mg/dL (ref 0.76–1.27)
GFR calc Af Amer: 118 mL/min/{1.73_m2} (ref >59)
GFR calc non Af Amer: 102 mL/min/{1.73_m2} (ref >59)
Globulin, Total: 2.9 g/dL (ref 1.5–4.5)
Glucose: 80 mg/dL (ref 65–99)
Potassium: 4.5 mmol/L (ref 3.5–5.2)
Sodium: 140 mmol/L (ref 134–144)
Total Protein: 7.5 g/dL (ref 6.0–8.5)

## 2019-12-16 LAB — LIPID PANEL
Chol/HDL Ratio: 4.3 ratio (ref 0.0–5.0)
Cholesterol, Total: 158 mg/dL (ref 100–199)
HDL: 37 mg/dL — ABNORMAL LOW (ref >39)
LDL Chol Calc (NIH): 94 mg/dL (ref 0–99)
Triglycerides: 154 mg/dL — ABNORMAL HIGH (ref 0–149)
VLDL Cholesterol Cal: 27 mg/dL (ref 5–40)

## 2019-12-16 LAB — CBC WITH DIFFERENTIAL
Basophils Absolute: 0.1 10*3/uL (ref 0.0–0.2)
Basos: 1 %
EOS (ABSOLUTE): 0.4 10*3/uL (ref 0.0–0.4)
Eos: 5 %
Hematocrit: 43 % (ref 37.5–51.0)
Hemoglobin: 14 g/dL (ref 13.0–17.7)
Immature Grans (Abs): 0 10*3/uL (ref 0.0–0.1)
Immature Granulocytes: 1 %
Lymphocytes Absolute: 2.4 10*3/uL (ref 0.7–3.1)
Lymphs: 30 %
MCH: 26.8 pg (ref 26.6–33.0)
MCHC: 32.6 g/dL (ref 31.5–35.7)
MCV: 82 fL (ref 79–97)
Monocytes Absolute: 0.7 10*3/uL (ref 0.1–0.9)
Monocytes: 9 %
Neutrophils Absolute: 4.4 10*3/uL (ref 1.4–7.0)
Neutrophils: 54 %
RBC: 5.23 x10E6/uL (ref 4.14–5.80)
RDW: 13.9 % (ref 11.6–15.4)
WBC: 8 10*3/uL (ref 3.4–10.8)

## 2019-12-18 NOTE — Progress Notes (Signed)
Please call patient with update.   Kidney function slightly elevated but improved since visit 1 year ago. Diabetes management may help with this. Continue medications as prescribed and follow-up with primary physician as scheduled.   Triglycerides which is a form of cholesterol is elevated. Increased triglycerides may increase risk of cardiovascular events such as heart attack and stroke. Continue Atorvastatin as prescribed.  HDL better known as good cholesterol is low. Increase intake of heart healthy foods such as lean-baked meats, vegetables, and fruit to help with this. Also, remember to do at least 150 minutes of moderate intensity exercise weekly as tolerated.  CBC normal meaning no anemia.

## 2019-12-19 ENCOUNTER — Telehealth: Payer: Self-pay

## 2019-12-19 NOTE — Telephone Encounter (Signed)
Contacted pt to go over lab results pt is aware and doesn't have any questions or concerns 

## 2020-01-02 MED FILL — ?AMLODIPINE BESYL 10MG TABL: 10 | 30 days supply | Qty: 30 | Fill #6

## 2020-01-02 MED FILL — METOPROLOL SUCCINATE ER 100: 100 | 30 days supply | Qty: 30 | Fill #6

## 2020-01-02 MED FILL — ?ATORVASTATIN 10 MG TABLET: 10 | 30 days supply | Qty: 30 | Fill #3

## 2020-01-02 MED FILL — HYDROCHLOROTHIAZIDE 25 MG T: 25 | 30 days supply | Qty: 30 | Fill #6

## 2020-02-02 ENCOUNTER — Other Ambulatory Visit: Payer: Self-pay | Admitting: Internal Medicine

## 2020-02-02 DIAGNOSIS — M1712 Unilateral primary osteoarthritis, left knee: Secondary | ICD-10-CM

## 2020-02-02 MED FILL — HYDROCHLOROTHIAZIDE 25 MG T: 25 | 30 days supply | Qty: 30 | Fill #7

## 2020-02-02 MED FILL — ?ATORVASTATIN 10 MG TABLET: 10 | 30 days supply | Qty: 30 | Fill #4

## 2020-02-02 MED FILL — AMLODIPINE BESYLATE 10 MG T: 10 | 30 days supply | Qty: 30 | Fill #7

## 2020-02-02 MED FILL — METOPROLOL SUCCINATE ER 100: 100 | 30 days supply | Qty: 30 | Fill #7

## 2020-02-02 NOTE — Telephone Encounter (Signed)
Requested medication (s) are due for refill today: no  Requested medication (s) are on the active medication list: yes  Last refill:  08/28/2019  Future visit scheduled: yes  Notes to clinic:  this refill cannot be delegated   Requested Prescriptions  Pending Prescriptions Disp Refills   traMADol (ULTRAM) 50 MG tablet [Pharmacy Med Name: traMADol HCL 50 MG TABS 50 Tablet] 60 tablet 0    Sig: TAKE 1 TABLET (50 MG TOTAL) BY MOUTH EVERY 12 (TWELVE) HOURS AS NEEDED.      Not Delegated - Analgesics:  Opioid Agonists Failed - 02/02/2020  9:10 AM      Failed - This refill cannot be delegated      Failed - Urine Drug Screen completed in last 360 days.      Passed - Valid encounter within last 6 months    Recent Outpatient Visits           1 month ago Essential hypertension   [facility] [place], [place], NP   9 months ago Essential hypertension   [facility] Marcine Matar, MD   1 year ago Essential hypertension   [facility] Marcine Matar, MD   1 year ago Dyslipidemia   [facility] Marcine Matar, MD   1 year ago Essential hypertension   [facility] Marcine Matar, MD       Future Appointments             In 1 month Laural Benes Binnie Rail, MD [facility]

## 2020-02-05 MED FILL — traMADol HCL 50 MG TABS: 50 | 30 days supply | Qty: 60 | Fill #0

## 2020-02-12 MED FILL — traMADol HCL 50 MG TABS: 50 | 30 days supply | Qty: 60 | Fill #0

## 2020-02-23 ENCOUNTER — Other Ambulatory Visit: Payer: Self-pay

## 2020-02-23 ENCOUNTER — Ambulatory Visit: Payer: Self-pay

## 2020-03-05 MED FILL — HYDROCHLOROTHIAZIDE 25 MG T: 25 | 30 days supply | Qty: 30 | Fill #8

## 2020-03-05 MED FILL — AMLODIPINE BESYLATE 10 MG T: 10 | 30 days supply | Qty: 30 | Fill #8

## 2020-03-05 MED FILL — METOPROLOL SUCCINATE ER 100: 100 | 30 days supply | Qty: 30 | Fill #8

## 2020-03-12 MED FILL — ?ATORVASTATIN 10 MG TABLET: 10 | 30 days supply | Qty: 30 | Fill #5

## 2020-03-25 ENCOUNTER — Ambulatory Visit: Payer: Self-pay | Admitting: Internal Medicine

## 2020-04-03 MED FILL — HYDROCHLOROTHIAZIDE 25 MG T: 25 | 30 days supply | Qty: 30 | Fill #0

## 2020-04-03 MED FILL — METOPROLOL SUCCINATE ER 100: 100 | 30 days supply | Qty: 30 | Fill #0

## 2020-04-03 MED FILL — AMLODIPINE BESYLATE 10 MG T: 10 | 30 days supply | Qty: 30 | Fill #0

## 2020-04-08 ENCOUNTER — Encounter: Payer: Self-pay | Admitting: Internal Medicine

## 2020-04-08 ENCOUNTER — Ambulatory Visit: Payer: Self-pay | Attending: Internal Medicine | Admitting: Internal Medicine

## 2020-04-08 ENCOUNTER — Other Ambulatory Visit: Payer: Self-pay

## 2020-04-08 VITALS — BP 122/78 | HR 68 | Resp 16 | Wt 246.2 lb

## 2020-04-08 DIAGNOSIS — I10 Essential (primary) hypertension: Secondary | ICD-10-CM

## 2020-04-08 DIAGNOSIS — E785 Hyperlipidemia, unspecified: Secondary | ICD-10-CM

## 2020-04-08 DIAGNOSIS — Z7189 Other specified counseling: Secondary | ICD-10-CM

## 2020-04-08 DIAGNOSIS — M1712 Unilateral primary osteoarthritis, left knee: Secondary | ICD-10-CM

## 2020-04-08 DIAGNOSIS — E669 Obesity, unspecified: Secondary | ICD-10-CM

## 2020-04-08 NOTE — Progress Notes (Signed)
Patient ID: Damon Peck, male    DOB: [birth year]  MRN: [id number]  CC: Hypertension   Subjective: Damon Peck is a 54 y.o. male who presents for chronic ds management. His concerns today include:  [name], HL, obesity, OA LT knee, COVID-19 infection 05/2019.   HYPERTENSION Currently taking: see medication list.  He is on Norvasc, metoprolol and hydrochlorothiazide. Med Adherence: [x]  Yes    []  No Medication side effects: []  Yes    [x]  No Adherence with salt restriction: [x]  Yes    []  No Home Monitoring?: [x]  Yes about once a week Monitoring Frequency: 118/70 was the lowest. 135/82 Home BP results range: []  Yes    []  No SOB? []  Yes    [x]  No Chest Pain?: []  Yes    [x]  No Leg swelling?: []  Yes    [x]  No Headaches?: []  Yes    [x]  No Dizziness? []  Yes    [x]  No Comments:   HL: Compliant with and tolerating atorvastatin.  Obesity: He is very active at work working as a .  He also goes to the gym several days a week to walk.  He does well with his eating habits.  He does not drink sweet drinks.  He does not snack on junk foods.  He has cut back on portion sizes of white carbohydrates.   His left knee bothers him on and off especially if he does a lot of standing.  He takes the tramadol as needed.  He does not have to take it every day.  Denies any significant side effects from the medication.  Last refilled in August of this year.  HM.  He has not had the COVID-19 vaccine.  He was wanting to get my opinion on whether he should get the vaccine or not having had COVID last winter.   Patient Active Problem List   Diagnosis Date Noted  . Abnormal CT of the head 05/30/2018  . Osteoarthritis of left knee 09/14/2017  . It band syndrome, right 12/30/2016  . Knee mass, left 12/09/2016  . Obesity (BMI 30-39.9) 08/06/2016  . Gastroesophageal reflux disease 04/26/2016  . Midline low back pain without sciatica 08/09/2014  . Dyslipidemia 04/24/2013  . HTN  (hypertension) 12/15/2012     Current Outpatient Medications on File Prior to Visit  Medication Sig Dispense Refill  . amLODipine (NORVASC) 10 MG tablet Take 1 tablet (10 mg total) by mouth daily. 90 tablet 0  . atorvastatin (LIPITOR) 10 MG tablet Take 1 tablet (10 mg total) by mouth daily. 90 tablet 0  . cyclobenzaprine (FLEXERIL) 5 MG tablet Take 1-2 tablets (5-10 mg total) by mouth 2 (two) times daily as needed for muscle spasms. 24 tablet 0  . hydrochlorothiazide (HYDRODIURIL) 25 MG tablet Take 1 tablet (25 mg total) by mouth daily. 90 tablet 0  . meloxicam (MOBIC) 7.5 MG tablet Take 1 tablet (7.5 mg total) by mouth daily. 30 tablet 0  . metoprolol succinate (TOPROL-XL) 100 MG 24 hr tablet Take 1 tablet (100 mg total) by mouth daily. Take with or immediately following a meal. 90 tablet 0  . traMADol (ULTRAM) 50 MG tablet TAKE 1 TABLET (50 MG TOTAL) BY MOUTH EVERY 12 (TWELVE) HOURS AS NEEDED. 60 tablet 0   No current facility-administered medications on file prior to visit.    No Known Allergies  Social History   Socioeconomic History  . Marital status: Married    Spouse name: Not on file  .  Number of children: Not on file  . Years of education: Not on file  . Highest education level: Not on file  Occupational History  . Not on file  Tobacco Use  . Smoking status: Never Smoker  . Smokeless tobacco: Never Used  Vaping Use  . Vaping Use: Never used  Substance and Sexual Activity  . Alcohol use: No  . Drug use: No  . Sexual activity: Yes    Birth control/protection: Condom  Other Topics Concern  . Not on file  Social History Narrative  . Not on file   Social Determinants of Health   Financial Resource Strain:   . Difficulty of Paying Living Expenses: Not on file  Food Insecurity:   . Worried About Programme researcher, broadcasting/film/video in the Last Year: Not on file  . Ran Out of Food in the Last Year: Not on file  Transportation Needs:   . Lack of Transportation (Medical): Not on file   . Lack of Transportation (Non-Medical): Not on file  Physical Activity:   . Days of Exercise per Week: Not on file  . Minutes of Exercise per Session: Not on file  Stress:   . Feeling of Stress : Not on file  Social Connections:   . Frequency of Communication with Friends and Family: Not on file  . Frequency of Social Gatherings with Friends and Family: Not on file  . Attends Religious Services: Not on file  . Active Member of Clubs or Organizations: Not on file  . Attends Banker Meetings: Not on file  . Marital Status: Not on file  Intimate Partner Violence:   . Fear of Current or Ex-Partner: Not on file  . Emotionally Abused: Not on file  . Physically Abused: Not on file  . Sexually Abused: Not on file    Family History  Problem Relation Age of Onset  . Diabetes Mother   . Hypertension Mother   . Diabetes Father   . Hypertension Father     Past Surgical History:  Procedure Laterality Date  . COLONOSCOPY WITH PROPOFOL N/A 02/11/2015   Procedure: COLONOSCOPY WITH PROPOFOL;  Surgeon: Charolett Bumpers, MD;  Location: [facility];  Service: Endoscopy;  Laterality: N/A;    ROS: Review of Systems Negative except as stated above  PHYSICAL EXAM: BP 122/78   Pulse 68   Resp 16   Wt 246 lb 3.2 oz (111.7 kg)   SpO2 98%   BMI 34.34 kg/m   Wt Readings from Last 3 Encounters:  04/08/20 246 lb 3.2 oz (111.7 kg)  12/15/19 260 lb 9.6 oz (118.2 kg)  10/11/19 248 lb (112.5 kg)    Physical Exam  General appearance - alert, well appearing, and in no distress Mental status - normal mood, behavior, speech, dress, motor activity, and thought processes Neck - supple, no significant adenopathy Chest - clear to auscultation, no wheezes, rales or rhonchi, symmetric air entry Heart - normal rate, regular rhythm, normal S1, S2, no murmurs, rubs, clicks or gallops Extremities - peripheral pulses normal, no pedal edema, no clubbing or cyanosis  CMP Latest Ref Rng & Units  12/15/2019 10/11/2018 05/17/2017  Glucose 65 - 99 mg/dL 80 790(W) 93  BUN 6 - 24 mg/dL 17 18 12   Creatinine 0.76 - 1.27 mg/dL 4.09) 7.35(H)  Sodium 134 - 144 mmol/L 140 141 141  Potassium 3.5 - 5.2 mmol/L 4.5 4.4 4.1  Chloride 96 - 106 mmol/L 100 101 98  CO2 20 - 29  mmol/L 26 25 26   Calcium 8.7 - 10.2 mg/dL 9.7 9.2 9.2  Total Protein 6.0 - 8.5 g/dL 7.5 7.0 7.6  Total Bilirubin 0.0 - 1.2 mg/dL 0.3 0.3 0.2  Alkaline Phos 48 - 121 IU/L 55 51 57  AST 0 - 40 IU/L 18 16 23   ALT 0 - 44 IU/L 23 27 27    Lipid Panel     Component Value Date/Time   CHOL 158 12/15/2019 1531   TRIG 154 (H) 12/15/2019 1531   HDL 37 (L) 12/15/2019 1531   CHOLHDL 4.3 12/15/2019 1531   CHOLHDL 3.9 04/24/2016 1619   VLDL 33 (H) 04/24/2016 1619   LDLCALC 94 12/15/2019 1531    CBC    Component Value Date/Time   WBC 8.0 12/15/2019 1531   RBC 5.23 12/15/2019 1531   HGB 14.0 12/15/2019 1531   HCT 43.0 12/15/2019 1531   PLT 240 10/11/2018 1010   MCV 82 12/15/2019 1531   MCH 26.8 12/15/2019 1531   MCHC 32.6 12/15/2019 1531   RDW 13.9 12/15/2019 1531   LYMPHSABS 2.4 12/15/2019 1531   EOSABS 0.4 12/15/2019 1531   BASOSABS 0.1 12/15/2019 1531    ASSESSMENT AND PLAN:  1. Essential hypertension At goal.  Continue current medications and low-salt diet.  Patient tells me he has not been taking potassium and looks at the last time it was filled was March 2020  2. Dyslipidemia LDL at goal.  Continue atorvastatin  3. Obesity (BMI 30.0-34.9) Commended him on staying active and trying to eat healthy.  Encouraged him to continue doing so.  4. Osteoarthritis of left knee, unspecified osteoarthritis type Stable.  Uses tramadol as needed but not every day.  No adverse effects from the medication.  02/14/2020 April 2020 controlled substance describing reviewed.  5. Educated about COVID-19 virus infection Advised and encourage patient to get the COVID-19 vaccine even though he has had the Covid infection last year.  This  is based on recommendation after about 3 months immunity starts to wain in pts who have had the infection.  I went over possible side effects of the vaccines.  Mild side effects can include headache, body aches and low-grade fever after the injection which can be resolved with taking Tylenol or ibuprofen.  More significant side effects include pericarditis, dural thrombosis etc   Patient was given the opportunity to ask questions.  Patient verbalized understanding of the plan and was able to repeat key elements of the plan.   No orders of the defined types were placed in this encounter.    Requested Prescriptions    No prescriptions requested or ordered in this encounter    Return in about 4 months (around 08/09/2020).  [place], MD, FACP

## 2020-04-15 MED FILL — ?ATORVASTATIN 10 MG TABLET: 10 | 30 days supply | Qty: 30 | Fill #6

## 2020-05-01 MED FILL — HYDROCHLOROTHIAZIDE 25 MG T: 25 | 30 days supply | Qty: 30 | Fill #1

## 2020-05-01 MED FILL — AMLODIPINE BESYLATE 10 MG T: 10 | 30 days supply | Qty: 30 | Fill #1

## 2020-05-01 MED FILL — METOPROLOL SUCCINATE ER 100: 100 | 30 days supply | Qty: 30 | Fill #1

## 2020-05-10 MED FILL — ?ATORVASTATIN 10 MG TABLET: 10 | 30 days supply | Qty: 30 | Fill #0

## 2020-05-13 ENCOUNTER — Other Ambulatory Visit: Payer: Self-pay | Admitting: Internal Medicine

## 2020-05-13 DIAGNOSIS — M1712 Unilateral primary osteoarthritis, left knee: Secondary | ICD-10-CM

## 2020-05-13 NOTE — Telephone Encounter (Signed)
Requested medication (s) are due for refill today:yes  Requested medication (s) are on the active medication list: yes  Last refill:  02/12/20  Future visit scheduled: yes  Notes to clinic:  not delegated    Requested Prescriptions  Pending Prescriptions Disp Refills   traMADol (ULTRAM) 50 MG tablet [Pharmacy Med Name: traMADol HCL 50 MG TABS 50 Tablet] 60 tablet 0    Sig: TAKE 1 TABLET (50 MG TOTAL) BY MOUTH EVERY 12 (TWELVE) HOURS AS NEEDED.      Not Delegated - Analgesics:  Opioid Agonists Failed - 05/13/2020  9:15 AM      Failed - This refill cannot be delegated      Failed - Urine Drug Screen completed in last 360 days      Passed - Valid encounter within last 6 months    Recent Outpatient Visits           1 month ago Essential hypertension   [facility] Marcine Matar, MD   5 months ago Essential hypertension   [facility] [place], [place], NP   1 year ago Essential hypertension   [facility] Marcine Matar, MD   1 year ago Essential hypertension   [facility] Marcine Matar, MD   1 year ago Dyslipidemia   [facility] Marcine Matar, MD       Future Appointments             In 2 months Laural Benes Binnie Rail, MD [facility]

## 2020-05-14 MED FILL — traMADol HCL 50 MG TABS: 50 | 30 days supply | Qty: 60 | Fill #0

## 2020-06-04 MED FILL — HYDROCHLOROTHIAZIDE 25 MG T: 25 | 30 days supply | Qty: 30 | Fill #2

## 2020-06-04 MED FILL — ?ATORVASTATIN 10 MG TABLET: 10 | 30 days supply | Qty: 30 | Fill #1

## 2020-06-04 MED FILL — AMLODIPINE BESYLATE 10 MG T: 10 | 30 days supply | Qty: 30 | Fill #2

## 2020-06-04 MED FILL — METOPROLOL SUCCINATE ER 100: 100 | 30 days supply | Qty: 30 | Fill #2

## 2020-07-02 ENCOUNTER — Other Ambulatory Visit: Payer: Self-pay | Admitting: Family

## 2020-07-02 ENCOUNTER — Other Ambulatory Visit: Payer: Self-pay | Admitting: Internal Medicine

## 2020-07-02 DIAGNOSIS — I10 Essential (primary) hypertension: Secondary | ICD-10-CM

## 2020-07-02 DIAGNOSIS — M1712 Unilateral primary osteoarthritis, left knee: Secondary | ICD-10-CM

## 2020-07-02 NOTE — Telephone Encounter (Signed)
Requested medication (s) are due for refill today: no  Requested medication (s) are on the active medication list: yes  Last refill: 02/12/2020  Future visit scheduled: yes  Notes to clinic:  this refill cannot be delegated   Requested Prescriptions  Pending Prescriptions Disp Refills   traMADol (ULTRAM) 50 MG tablet [Pharmacy Med Name: traMADol HCL 50 MG TABS 50 Tablet] 60 tablet 0    Sig: TAKE 1 TABLET (50 MG TOTAL) BY MOUTH EVERY 12 (TWELVE) HOURS AS NEEDED.      Not Delegated - Analgesics:  Opioid Agonists Failed - 07/02/2020 12:43 PM      Failed - This refill cannot be delegated      Failed - Urine Drug Screen completed in last 360 days      Passed - Valid encounter within last 6 months    Recent Outpatient Visits           2 months ago Essential hypertension   [facility] Marcine Matar, MD   6 months ago Essential hypertension   [facility] [place], [place], NP   1 year ago Essential hypertension   [facility] Marcine Matar, MD   1 year ago Essential hypertension   [facility] Marcine Matar, MD   1 year ago Dyslipidemia   [facility] Marcine Matar, MD       Future Appointments             In 1 month Laural Benes Binnie Rail, MD [facility]

## 2020-07-03 ENCOUNTER — Other Ambulatory Visit: Payer: Self-pay | Admitting: Family Medicine

## 2020-07-03 MED FILL — traMADol HCL 50 MG TABS: 50 | 30 days supply | Qty: 60 | Fill #0

## 2020-07-09 ENCOUNTER — Telehealth: Payer: Self-pay | Admitting: Internal Medicine

## 2020-07-09 ENCOUNTER — Other Ambulatory Visit: Payer: Self-pay | Admitting: Internal Medicine

## 2020-07-09 DIAGNOSIS — I10 Essential (primary) hypertension: Secondary | ICD-10-CM

## 2020-07-09 DIAGNOSIS — E785 Hyperlipidemia, unspecified: Secondary | ICD-10-CM

## 2020-07-09 MED ORDER — METOPROLOL SUCCINATE ER 100 MG PO TB24: 100.0000 mg | ORAL_TABLET | Freq: Every day | ORAL | 1 refills | Status: DC

## 2020-07-09 MED ORDER — AMLODIPINE BESYLATE 10 MG PO TABS: 10.0000 mg | ORAL_TABLET | Freq: Every day | ORAL | 1 refills | Status: DC

## 2020-07-09 MED ORDER — HYDROCHLOROTHIAZIDE 25 MG PO TABS: 25.0000 mg | ORAL_TABLET | Freq: Every day | ORAL | 1 refills | Status: DC

## 2020-07-09 MED FILL — HYDROCHLOROTHIAZIDE 25 MG T: 25 | 30 days supply | Qty: 30 | Fill #0

## 2020-07-09 MED FILL — AMLODIPINE BESYLATE 10 MG T: 10 | 30 days supply | Qty: 30 | Fill #0

## 2020-07-09 MED FILL — METOPROLOL SUCCINATE ER 100: 100 | 30 days supply | Qty: 30 | Fill #0

## 2020-07-09 MED FILL — ?ATORVASTATIN 10 MG TABLET: 10 | 30 days supply | Qty: 30 | Fill #2

## 2020-07-09 NOTE — Telephone Encounter (Signed)
Pt came to request a refill for metoprolol succinate (TOPROL-XL) 100 MG 24 hr tablet hydrochlorothiazide (HYDRODIURIL) 25 MG tablet  amLODipine (NORVASC) 10 MG tablet please send to the pharmacy [facility]  Please call Pt when has been [place]

## 2020-07-09 NOTE — Telephone Encounter (Signed)
Rx sent 

## 2020-08-05 ENCOUNTER — Other Ambulatory Visit: Payer: Self-pay | Admitting: Family

## 2020-08-05 DIAGNOSIS — E785 Hyperlipidemia, unspecified: Secondary | ICD-10-CM

## 2020-08-05 MED FILL — HYDROCHLOROTHIAZIDE 25 MG T: 25 | 30 days supply | Qty: 30 | Fill #1

## 2020-08-05 MED FILL — METOPROLOL SUCCINATE ER 100: 100 | 30 days supply | Qty: 30 | Fill #1

## 2020-08-05 MED FILL — AMLODIPINE BESYLATE 10 MG T: 10 | 30 days supply | Qty: 30 | Fill #1

## 2020-08-07 ENCOUNTER — Other Ambulatory Visit: Payer: Self-pay | Admitting: Internal Medicine

## 2020-08-07 MED FILL — ?ATORVASTATIN 10 MG TABLET: 10 | 30 days supply | Qty: 30 | Fill #0

## 2020-08-09 ENCOUNTER — Other Ambulatory Visit: Payer: Self-pay | Admitting: Internal Medicine

## 2020-08-09 ENCOUNTER — Other Ambulatory Visit: Payer: Self-pay

## 2020-08-09 ENCOUNTER — Ambulatory Visit: Payer: Self-pay | Attending: Internal Medicine | Admitting: Internal Medicine

## 2020-08-09 ENCOUNTER — Encounter: Payer: Self-pay | Admitting: Internal Medicine

## 2020-08-09 DIAGNOSIS — M1712 Unilateral primary osteoarthritis, left knee: Secondary | ICD-10-CM

## 2020-08-09 DIAGNOSIS — I10 Essential (primary) hypertension: Secondary | ICD-10-CM

## 2020-08-09 DIAGNOSIS — E785 Hyperlipidemia, unspecified: Secondary | ICD-10-CM

## 2020-08-09 MED ORDER — TRAMADOL HCL 50 MG PO TABS: 50.0000 mg | ORAL_TABLET | Freq: Two times a day (BID) | ORAL | 0 refills | Status: DC | PRN

## 2020-08-09 MED ORDER — ATORVASTATIN CALCIUM 10 MG PO TABS: 10.0000 mg | ORAL_TABLET | Freq: Every day | ORAL | 3 refills | Status: DC

## 2020-08-09 MED ORDER — HYDROCHLOROTHIAZIDE 25 MG PO TABS: 25.0000 mg | ORAL_TABLET | Freq: Every day | ORAL | 3 refills | Status: DC

## 2020-08-09 MED ORDER — AMLODIPINE BESYLATE 10 MG PO TABS: 10.0000 mg | ORAL_TABLET | Freq: Every day | ORAL | 3 refills | Status: DC

## 2020-08-09 MED ORDER — METOPROLOL SUCCINATE ER 100 MG PO TB24: 100.0000 mg | ORAL_TABLET | Freq: Every day | ORAL | 3 refills | Status: DC

## 2020-08-09 MED FILL — traMADol HCL 50 MG TABS: 50 | 30 days supply | Qty: 60 | Fill #0

## 2020-08-09 NOTE — Progress Notes (Addendum)
Virtual Visit via Telephone Note  I connected with Damon Peck on 08/09/20 at 12:38 p.m by telephone and verified that I am speaking with the correct person using two identifiers.  Location: Patient: home Provider: office  The patient, my CMA Ms. Damon Peck and myself participated in this encounter. I discussed the limitations, risks, security and privacy concerns of performing an evaluation and management service by telephone and the availability of in person appointments. I also discussed with the patient that there may be a patient responsible charge related to this service. The patient expressed understanding and agreed to proceed.   History of Present Illness: [name], HL, obesity, OA LT knee, COVID-19 infection 05/2019.  HYPERTENSION Currently taking: see medication list Med Adherence: [x]  Yes.  He is on hydrochlorothiazide, Norvasc and metoprolol    []  No Medication side effects: []  Yes    [x]  No Adherence with salt restriction: [x]  Yes    []  No Home Monitoring?: [x]  Yes    []  No Monitoring Frequency: [x]  Yes    []  No Home BP results range: Reports blood pressure range at home has been good.  Systolic blood pressure usually in the 20s to low 130s and diastolic blood pressure 80 or lower. SOB? []  Yes    [x]  No Chest Pain?: []  Yes    [x]  No Leg swelling?: []  Yes    [x]  No Headaches?: []  Yes    [x]  No Dizziness? []  Yes    [x]  No Comments:   HL: Reports compliance with atorvastatin.  No muscle cramps or aches.  OA LT knee: Left knee flares up intermittently.  Goes to gym 3-4 times a wk for exercise.  Doing well on tramadol without any significant side effects.  Requesting refill.  HM: has not gotten COVID vaccine and reluctant.  Reports having COVID again several wks ago.  Feeling better  Outpatient Encounter Medications as of 08/09/2020  Medication Sig  . traMADol (ULTRAM) 50 MG tablet TAKE 1 TABLET (50 MG TOTAL) BY MOUTH EVERY 12 (TWELVE) HOURS AS NEEDED.   amLODipine (NORVASC) 10 MG tablet Take 1 tablet (10 mg total) by mouth daily.  atorvastatin (LIPITOR) 10 MG tablet TAKE 1 TABLET (10 MG TOTAL) BY MOUTH DAILY.  . cyclobenzaprine (FLEXERIL) 5 MG tablet Take 1-2 tablets (5-10 mg total) by mouth 2 (two) times daily as needed for muscle spasms.  . hydrochlorothiazide (HYDRODIURIL) 25 MG tablet Take 1 tablet (25 mg total) by mouth daily.  . meloxicam (MOBIC) 7.5 MG tablet Take 1 tablet (7.5 mg total) by mouth daily.  . metoprolol succinate (TOPROL-XL) 100 MG 24 hr tablet Take 1 tablet (100 mg total) by mouth daily. Take with or immediately following a meal.   No facility-administered encounter medications on file as of 08/09/2020.    Observations/Objective: No direct observation done as this was a telephone encounter.  Assessment and Plan: 1. Essential hypertension Home blood pressure readings are good.  Continue current medications and low-salt diet - amLODipine (NORVASC) 10 MG tablet; Take 1 tablet (10 mg total) by mouth daily.  Dispense: 30 tablet; Refill: 3 - hydrochlorothiazide (HYDRODIURIL) 25 MG tablet; Take 1 tablet (25 mg total) by mouth daily.  Dispense: 30 tablet; Refill: 3 - metoprolol succinate (TOPROL-XL) 100 MG 24 hr tablet; Take 1 tablet (100 mg total) by mouth daily. Take with or immediately following a meal.  Dispense: 30 tablet; Refill: 3  2. Dyslipidemia - atorvastatin (LIPITOR) 10 MG tablet; Take 1 tablet (10 mg  total) by mouth daily.  Dispense: 30 tablet; Refill: 3  3. Osteoarthritis of left knee, unspecified osteoarthritis type Refill given on tramadol to use as needed.  No significant side effects from the medication and patient reports benefit.  [place] controlled substance reporting system reviewed. - traMADol (ULTRAM) 50 MG tablet; Take 1 tablet (50 mg total) by mouth every 12 (twelve) hours as needed.  Dispense: 60 tablet; Refill: 0  4.  Vaccine hesitancy Still try to encourage him to receive the COVID-19  vaccine.  Encouraged him to wear his mask when out in public Follow Up Instructions: 4 months   I discussed the assessment and treatment plan with the patient. The patient was provided an opportunity to ask questions and all were answered. The patient agreed with the plan and demonstrated an understanding of the instructions.   The patient was advised to call back or seek an in-person evaluation if the symptoms worsen or if the condition fails to improve as anticipated.  I provided 4 minutes of non-face-to-face time during this encounter.   Damon Blue, MD

## 2020-09-02 MED FILL — traMADol HCL 50 MG TABS: 50 | 30 days supply | Qty: 60 | Fill #0

## 2020-09-03 MED FILL — AMLODIPINE BESYLATE 10 MG T: 10 | 30 days supply | Qty: 30 | Fill #0

## 2020-09-03 MED FILL — METOPROLOL SUCCINATE ER 100: 100 | 30 days supply | Qty: 30 | Fill #0

## 2020-09-03 MED FILL — ?ATORVASTATIN 10 MG TABLET: 10 | 30 days supply | Qty: 30 | Fill #0

## 2020-09-03 MED FILL — HYDROCHLOROTHIAZIDE 25 MG T: 25 | 30 days supply | Qty: 30 | Fill #0

## 2020-09-30 MED FILL — HYDROCHLOROTHIAZIDE 25 MG T: 25 | 30 days supply | Qty: 30 | Fill #1

## 2020-09-30 MED FILL — ?ATORVASTATIN 10 MG TABLET: 10 | 30 days supply | Qty: 30 | Fill #1

## 2020-09-30 MED FILL — AMLODIPINE BESYLATE 10 MG T: 10 | 30 days supply | Qty: 30 | Fill #1

## 2020-09-30 MED FILL — METOPROLOL SUCCINATE ER 100: 100 | 30 days supply | Qty: 30 | Fill #1

## 2020-10-10 ENCOUNTER — Other Ambulatory Visit: Payer: Self-pay

## 2020-10-10 ENCOUNTER — Ambulatory Visit: Payer: Self-pay | Attending: Internal Medicine

## 2020-11-05 ENCOUNTER — Other Ambulatory Visit: Payer: Self-pay

## 2020-11-05 ENCOUNTER — Other Ambulatory Visit: Payer: Self-pay | Admitting: Internal Medicine

## 2020-11-05 DIAGNOSIS — M1712 Unilateral primary osteoarthritis, left knee: Secondary | ICD-10-CM

## 2020-11-05 MED ORDER — TRAMADOL HCL 50 MG PO TABS
ORAL_TABLET | Freq: Two times a day (BID) | ORAL | 0 refills | Status: DC | PRN
  Filled 2020-11-05: qty 60, 30d supply, fill #0

## 2020-11-05 MED FILL — Atorvastatin Calcium Tab 10 MG (Base Equivalent): ORAL | 30 days supply | Qty: 30 | Fill #0 | Status: AC

## 2020-11-05 MED FILL — Metoprolol Succinate Tab ER 24HR 100 MG (Tartrate Equiv): ORAL | 30 days supply | Qty: 30 | Fill #0 | Status: AC

## 2020-11-05 MED FILL — Hydrochlorothiazide Tab 25 MG: ORAL | 30 days supply | Qty: 30 | Fill #0 | Status: AC

## 2020-11-05 MED FILL — Amlodipine Besylate Tab 10 MG (Base Equivalent): ORAL | 30 days supply | Qty: 30 | Fill #0 | Status: AC

## 2020-11-05 NOTE — Telephone Encounter (Signed)
Requested medication (s) are due for refill today:   Provider to review  Requested medication (s) are on the active medication list:   Yes  Future visit scheduled:   Yes   Last ordered: 08/09/2020 #60, 0 refills  Non delegated refill    Requested Prescriptions  Pending Prescriptions Disp Refills   traMADol (ULTRAM) 50 MG tablet 60 tablet 0    Sig: TAKE 1 TABLET (50 MG TOTAL) BY MOUTH EVERY 12 (TWELVE) HOURS AS NEEDED.      Not Delegated - Analgesics:  Opioid Agonists Failed - 11/05/2020  1:34 PM      Failed - This refill cannot be delegated      Failed - Urine Drug Screen completed in last 360 days      Passed - Valid encounter within last 6 months    Recent Outpatient Visits           2 months ago Essential hypertension   [place] [facility] Marcine Matar, MD   7 months ago Essential hypertension   [place] [facility] Marcine Matar, MD   10 months ago Essential hypertension   [place] [facility] [place], [place], NP   1 year ago Essential hypertension   [place] [facility] Marcine Matar, MD   1 year ago Essential hypertension    [facility] Marcine Matar, MD       Future Appointments             In 1 month Laural Benes Binnie Rail, MD [facility]

## 2020-11-06 ENCOUNTER — Other Ambulatory Visit: Payer: Self-pay

## 2020-11-11 ENCOUNTER — Ambulatory Visit: Payer: Self-pay

## 2020-11-11 ENCOUNTER — Other Ambulatory Visit: Payer: Self-pay

## 2020-11-18 ENCOUNTER — Ambulatory Visit: Payer: Self-pay | Attending: Internal Medicine

## 2020-11-18 ENCOUNTER — Other Ambulatory Visit: Payer: Self-pay

## 2020-11-24 ENCOUNTER — Emergency Department (HOSPITAL_COMMUNITY)
Admission: EM | Admit: 2020-11-24 | Discharge: 2020-11-24 | Disposition: A | Discharge status: Home / Self Care | Payer: Self-pay | Attending: Emergency Medicine | Admitting: Emergency Medicine

## 2020-11-24 ENCOUNTER — Emergency Department (HOSPITAL_COMMUNITY): Payer: Self-pay

## 2020-11-24 ENCOUNTER — Encounter (HOSPITAL_COMMUNITY): Payer: Self-pay | Admitting: Emergency Medicine

## 2020-11-24 ENCOUNTER — Other Ambulatory Visit: Payer: Self-pay

## 2020-11-24 DIAGNOSIS — R079 Chest pain, unspecified: Secondary | ICD-10-CM | POA: Insufficient documentation

## 2020-11-24 DIAGNOSIS — R200 Anesthesia of skin: Secondary | ICD-10-CM | POA: Insufficient documentation

## 2020-11-24 DIAGNOSIS — Z5321 Procedure and treatment not carried out due to patient leaving prior to being seen by health care provider: Secondary | ICD-10-CM | POA: Insufficient documentation

## 2020-11-24 LAB — BASIC METABOLIC PANEL
Anion gap: 10 (ref 5–15)
BUN: 14 mg/dL (ref 6–20)
CO2: 29 mmol/L (ref 22–32)
Calcium: 9.3 mg/dL (ref 8.9–10.3)
Chloride: 100 mmol/L (ref 98–111)
Creatinine, Ser: 0.67 mg/dL (ref 0.61–1.24)
GFR, Estimated: >60 mL/min (ref >60)
Glucose, Bld: 125 mg/dL — ABNORMAL HIGH (ref 70–99)
Potassium: 3.6 mmol/L (ref 3.5–5.1)
Sodium: 139 mmol/L (ref 135–145)

## 2020-11-24 LAB — CBC
HCT: 42.7 % (ref 39.0–52.0)
Hemoglobin: 13.9 g/dL (ref 13.0–17.0)
MCH: 27.1 pg (ref 26.0–34.0)
MCHC: 32.6 g/dL (ref 30.0–36.0)
MCV: 83.4 fL (ref 80.0–100.0)
Platelets: 249 10*3/uL (ref 150–400)
RBC: 5.12 MIL/uL (ref 4.22–5.81)
RDW: 13.8 % (ref 11.5–15.5)
WBC: 9 10*3/uL (ref 4.0–10.5)
nRBC: 0 % (ref 0.0–0.2)

## 2020-11-24 LAB — TROPONIN I (HIGH SENSITIVITY): Troponin I (High Sensitivity): 3 ng/L (ref <18)

## 2020-11-24 NOTE — ED Notes (Signed)
Pt did not answer when called to get labs.

## 2020-11-24 NOTE — ED Triage Notes (Signed)
Patient states that he noticed some intermittent chest pain since last night at midnight, also some numbness on his L arm and L leg. No balance issues, or trouble walking.

## 2020-11-24 NOTE — ED Provider Notes (Signed)
Emergency Medicine Provider Triage Evaluation Note  Damon Peck , a 55 y.o. male  was evaluated in triage.  Pt complains of left side CP radiates to arm and leg, onset last night around midnight. Described as feeling like he needs to hold the chest due to pain, unable to describe. Chest pain improves when he cracks his chest (like popping knuckles). Belching. Was able to work out in the gym today without worse. Feels like arm and leg are numb/weak at times initially now constant.  Last known normal at midnight.   Review of Systems  Positive: Left side body pain/weakness Negative: SHOB  Physical Exam  BP 134/84 (BP Location: Left Arm)   Pulse 65   Temp 98.1 F (36.7 C) (Oral)   Resp 16   SpO2 98%  Gen:   Awake, no distress   Resp:  Normal effort  MSK:   Moves extremities without difficulty, equal arm and leg strength, normal gait Other:  Sensation intact and symmetric.   Medical Decision Making  Medically screening exam initiated at 4:14 PM.  Appropriate orders placed.  Damon Peck was informed that the remainder of the evaluation will be completed by another provider, this initial triage assessment does not replace that evaluation, and the importance of remaining in the ED until their evaluation is complete.     Damon Fend, PA-C 11/24/20 1618    Damon Barrette, MD 12/09/20 1053

## 2020-12-02 MED FILL — Metoprolol Succinate Tab ER 24HR 100 MG (Tartrate Equiv): ORAL | 30 days supply | Qty: 30 | Fill #1 | Status: AC

## 2020-12-02 MED FILL — Hydrochlorothiazide Tab 25 MG: ORAL | 30 days supply | Qty: 30 | Fill #1 | Status: AC

## 2020-12-02 MED FILL — Atorvastatin Calcium Tab 10 MG (Base Equivalent): ORAL | 30 days supply | Qty: 30 | Fill #1 | Status: AC

## 2020-12-02 MED FILL — Amlodipine Besylate Tab 10 MG (Base Equivalent): ORAL | 30 days supply | Qty: 30 | Fill #1 | Status: AC

## 2020-12-03 ENCOUNTER — Other Ambulatory Visit: Payer: Self-pay

## 2020-12-05 ENCOUNTER — Other Ambulatory Visit: Payer: Self-pay

## 2020-12-09 ENCOUNTER — Ambulatory Visit: Payer: Self-pay | Admitting: Internal Medicine

## 2020-12-28 ENCOUNTER — Other Ambulatory Visit: Payer: Self-pay | Admitting: Internal Medicine

## 2020-12-28 DIAGNOSIS — I10 Essential (primary) hypertension: Secondary | ICD-10-CM

## 2020-12-28 DIAGNOSIS — E785 Hyperlipidemia, unspecified: Secondary | ICD-10-CM

## 2020-12-28 MED ORDER — HYDROCHLOROTHIAZIDE 25 MG PO TABS
ORAL_TABLET | Freq: Every day | ORAL | 0 refills | Status: DC
  Filled 2020-12-28: qty 30, 30d supply, fill #0

## 2020-12-28 MED ORDER — AMLODIPINE BESYLATE 10 MG PO TABS
ORAL_TABLET | Freq: Every day | ORAL | 0 refills | Status: DC
Start: 2020-12-28 — End: 2021-01-31
  Filled 2020-12-28: qty 30, 30d supply, fill #0

## 2020-12-28 MED ORDER — METOPROLOL SUCCINATE ER 100 MG PO TB24
ORAL_TABLET | Freq: Every day | ORAL | 0 refills | Status: DC
  Filled 2020-12-28: qty 30, 30d supply, fill #0

## 2020-12-28 NOTE — Telephone Encounter (Signed)
Requested medication (s) are due for refill today: yes  Requested medication (s) are on the active medication list: yes  Last refill:  08/09/20  Future visit scheduled: yes  Notes to clinic:  overdue lab work   Requested Prescriptions  Pending Prescriptions Disp Refills   atorvastatin (LIPITOR) 10 MG tablet 30 tablet     Sig: Take by mouth daily.      Cardiovascular:  Antilipid - Statins Failed - 12/28/2020 10:23 AM      Failed - Total Cholesterol in normal range and within 360 days    Cholesterol, Total  Date Value Ref Range Status  12/15/2019 158 100 - 199 mg/dL Final          Failed - LDL in normal range and within 360 days    LDL Chol Calc (NIH)  Date Value Ref Range Status  12/15/2019 94 0 - 99 mg/dL Final          Failed - HDL in normal range and within 360 days    HDL  Date Value Ref Range Status  12/15/2019 37 (L) >39 mg/dL Final          Failed - Triglycerides in normal range and within 360 days    Triglycerides  Date Value Ref Range Status  12/15/2019 154 (H) 0 - 149 mg/dL Final          Passed - Patient is not pregnant      Passed - Valid encounter within last 12 months    Recent Outpatient Visits           4 months ago Essential hypertension   [place] [facility] Marcine Matar, MD   8 months ago Essential hypertension   [place] [facility] Marcine Matar, MD   1 year ago Essential hypertension   [place] [facility] [place], [place], NP   1 year ago Essential hypertension   [place] [facility] Marcine Matar, MD   1 year ago Essential hypertension   [place] [facility] Marcine Matar, MD       Future Appointments             In 2 weeks Marcine Matar, MD [facility]              Signed Prescriptions Disp Refills   metoprolol succinate (TOPROL-XL) 100 MG 24 hr tablet 30 tablet  0    Sig: Take by mouth daily.      Cardiovascular:  Beta Blockers Failed - 12/28/2020 10:23 AM      Failed - Last BP in normal range    BP Readings from Last 1 Encounters:  11/24/20 (!) 142/85          Passed - Last Heart Rate in normal range    Pulse Readings from Last 1 Encounters:  11/24/20 69          Passed - Valid encounter within last 6 months    Recent Outpatient Visits           4 months ago Essential hypertension   [place] [facility] Marcine Matar, MD   8 months ago Essential hypertension   [place] [facility] Marcine Matar, MD   1 year ago Essential hypertension    [facility] [place], [place], NP   1 year ago Essential  hypertension   [place] [facility] Marcine Matar, MD   1 year ago Essential hypertension   [place] [facility] Marcine Matar, MD       Future Appointments             In 2 weeks Marcine Matar, MD [facility]               amLODipine (NORVASC) 10 MG tablet 30 tablet 0    Sig: Take by mouth daily.      Cardiovascular:  Calcium Channel Blockers Failed - 12/28/2020 10:23 AM      Failed - Last BP in normal range    BP Readings from Last 1 Encounters:  11/24/20 (!) 142/85          Passed - Valid encounter within last 6 months    Recent Outpatient Visits           4 months ago Essential hypertension   [place] [facility] Marcine Matar, MD   8 months ago Essential hypertension   [place] [facility] Marcine Matar, MD   1 year ago Essential hypertension   [place] [facility] [place], [place], NP   1 year ago Essential hypertension   [place] [facility] Marcine Matar, MD   1 year ago Essential hypertension   [place] [facility] Marcine Matar, MD       Future Appointments             In 2 weeks Marcine Matar, MD  [facility]               hydrochlorothiazide (HYDRODIURIL) 25 MG tablet 30 tablet 0    Sig: Take by mouth daily.      Cardiovascular: Diuretics - Thiazide Failed - 12/28/2020 10:23 AM      Failed - Last BP in normal range    BP Readings from Last 1 Encounters:  11/24/20 (!) 142/85          Passed - Ca in normal range and within 360 days    Calcium  Date Value Ref Range Status  11/24/2020 9.3 8.9 - 10.3 mg/dL Final          Passed - Cr in normal range and within 360 days    Creat  Date Value Ref Range Status  04/24/2016 0.86 0.70 - 1.33 mg/dL Final    Comment:      For patients > or = 57 years of age: The upper reference limit for Creatinine is approximately 13% higher for people identified as African-American.      Creatinine, Ser  Date Value Ref Range Status  11/24/2020 0.67 0.61 - 1.24 mg/dL Final          Passed - K in normal range and within 360 days    Potassium  Date Value Ref Range Status  11/24/2020 3.6 3.5 - 5.1 mmol/L Final          Passed - Na in normal range and within 360 days    Sodium  Date Value Ref Range Status  11/24/2020 139 135 - 145 mmol/L Final  12/15/2019 140 134 - 144 mmol/L Final          Passed - Valid encounter within last 6 months    Recent Outpatient Visits  4 months ago Essential hypertension   [place] [facility] Marcine Matar, MD   8 months ago Essential hypertension   [facility] Marcine Matar, MD   1 year ago Essential hypertension   [place] [facility] [place], [place], [place]   1 year ago Essential hypertension   [place] [facility] Marcine Matar, MD   1 year ago Essential hypertension   [place] [facility] Marcine Matar, MD       Future  Appointments             In 2 weeks Marcine Matar, MD [facility]

## 2020-12-28 NOTE — Telephone Encounter (Signed)
Requested Prescriptions  Pending Prescriptions Disp Refills  . metoprolol succinate (TOPROL-XL) 100 MG 24 hr tablet 30 tablet 0    Sig: Take by mouth daily.     Cardiovascular:  Beta Blockers Failed - 12/28/2020 10:23 AM      Failed - Last BP in normal range    BP Readings from Last 1 Encounters:  11/24/20 (!) 142/85         Passed - Last Heart Rate in normal range    Pulse Readings from Last 1 Encounters:  11/24/20 69         Passed - Valid encounter within last 6 months    Recent Outpatient Visits          4 months ago Essential hypertension   [place] [facility] Marcine Matar, MD   8 months ago Essential hypertension   [place] [facility] Marcine Matar, MD   1 year ago Essential hypertension   [place] [facility] [place], [place], NP   1 year ago Essential hypertension   [place] [facility] Marcine Matar, MD   1 year ago Essential hypertension   [place] [facility] Marcine Matar, MD      Future Appointments            In 2 weeks Marcine Matar, MD [facility]           . atorvastatin (LIPITOR) 10 MG tablet 30 tablet     Sig: Take by mouth daily.     Cardiovascular:  Antilipid - Statins Failed - 12/28/2020 10:23 AM      Failed - Total Cholesterol in normal range and within 360 days    Cholesterol, Total  Date Value Ref Range Status  12/15/2019 158 100 - 199 mg/dL Final         Failed - LDL in normal range and within 360 days    LDL Chol Calc (NIH)  Date Value Ref Range Status  12/15/2019 94 0 - 99 mg/dL Final         Failed - HDL in normal range and within 360 days    HDL  Date Value Ref Range Status  12/15/2019 37 (L) >39 mg/dL Final         Failed - Triglycerides in normal range and within 360 days    Triglycerides  Date Value Ref Range Status  12/15/2019 154 (H) 0 - 149 mg/dL Final          Passed - Patient is not pregnant      Passed - Valid encounter within last 12 months    Recent Outpatient Visits          4 months ago Essential hypertension   [place] [facility] Marcine Matar, MD   8 months ago Essential hypertension   [place] [facility] Marcine Matar, MD   1 year ago Essential hypertension   [place] [facility] [place], [place], NP   1 year ago Essential hypertension   [place] [facility] Marcine Matar, MD   1 year ago Essential hypertension   [place] [facility] Marcine Matar, MD      Future Appointments            In 2 weeks Marcine Matar, MD [facility]           . amLODipine (NORVASC) 10 MG tablet 30 tablet 0    Sig: Take by mouth daily.     Cardiovascular:  Calcium Channel Blockers Failed - 12/28/2020 10:23 AM      Failed - Last BP in normal range    BP Readings from Last 1 Encounters:  11/24/20 (!) 142/85         Passed - Valid encounter within last 6 months    Recent Outpatient Visits          4 months ago Essential hypertension   [place] [facility] Marcine Matar, MD   8 months ago Essential hypertension   [place] [facility] Marcine Matar, MD   1 year ago Essential hypertension   [place] [facility] [place], [place], NP   1 year ago Essential hypertension   [place] [facility] Marcine Matar, MD   1 year ago Essential hypertension   [place] [facility] Marcine Matar, MD      Future Appointments            In 2 weeks Marcine Matar, MD [facility]           . hydrochlorothiazide (HYDRODIURIL) 25 MG tablet 30 tablet 0    Sig: Take by mouth daily.     Cardiovascular: Diuretics - Thiazide Failed - 12/28/2020  10:23 AM      Failed - Last BP in normal range    BP Readings from Last 1 Encounters:  11/24/20 (!) 142/85         Passed - Ca in normal range and within 360 days    Calcium  Date Value Ref Range Status  11/24/2020 9.3 8.9 - 10.3 mg/dL Final         Passed - Cr in normal range and within 360 days    Creat  Date Value Ref Range Status  04/24/2016 0.86 0.70 - 1.33 mg/dL Final    Comment:      For patients > or = 57 years of age: The upper reference limit for Creatinine is approximately 13% higher for people identified as African-American.      Creatinine, Ser  Date Value Ref Range Status  11/24/2020 0.67 0.61 - 1.24 mg/dL Final         Passed - K in normal range and within 360 days    Potassium  Date Value Ref Range Status  11/24/2020 3.6 3.5 - 5.1 mmol/L Final         Passed - Na in normal range and within 360 days    Sodium  Date Value Ref Range Status  11/24/2020 139 135 - 145 mmol/L Final  12/15/2019 140 134 - 144 mmol/L Final         Passed - Valid encounter within last 6 months    Recent Outpatient Visits          4 months ago Essential hypertension   [place] [facility] Marcine Matar, MD   8 months ago Essential hypertension   [place] [facility] Marcine Matar, MD   1 year ago Essential hypertension   [place] [facility] [place], [place], NP   1 year ago Essential hypertension   [facility] [name] B,  MD   1 year ago Essential hypertension   [place] [facility] Marcine Matar, MD      Future Appointments            In 2 weeks Laural Benes Binnie Rail, MD [facility]

## 2020-12-30 ENCOUNTER — Other Ambulatory Visit: Payer: Self-pay

## 2020-12-30 MED ORDER — ATORVASTATIN CALCIUM 10 MG PO TABS
10.0000 mg | ORAL_TABLET | Freq: Every day | ORAL | 0 refills | Status: DC
  Filled 2020-12-30: qty 30, 30d supply, fill #0

## 2021-01-02 ENCOUNTER — Other Ambulatory Visit: Payer: Self-pay

## 2021-01-14 ENCOUNTER — Other Ambulatory Visit: Payer: Self-pay | Admitting: Internal Medicine

## 2021-01-14 DIAGNOSIS — M1712 Unilateral primary osteoarthritis, left knee: Secondary | ICD-10-CM

## 2021-01-14 MED ORDER — TRAMADOL HCL 50 MG PO TABS
ORAL_TABLET | Freq: Two times a day (BID) | ORAL | 0 refills | Status: DC | PRN
  Filled 2021-01-14: qty 60, 30d supply, fill #0

## 2021-01-14 NOTE — Telephone Encounter (Signed)
Requested medication (s) are due for refill today: no   Requested medication (s) are on the active medication list:yes   Last refill:  11/11/2020  Future visit scheduled:  yes  Notes to clinic: this refill cannot be delegated    Requested Prescriptions  Pending Prescriptions Disp Refills   traMADol (ULTRAM) 50 MG tablet 60 tablet 0    Sig: Take by mouth every 12 (twelve) hours as needed.      Not Delegated - Analgesics:  Opioid Agonists Failed - 01/14/2021 10:29 AM      Failed - This refill cannot be delegated      Failed - Urine Drug Screen completed in last 360 days      Passed - Valid encounter within last 6 months    Recent Outpatient Visits           5 months ago Essential hypertension   [place] [facility] Marcine Matar, MD   9 months ago Essential hypertension   [place] [facility] Marcine Matar, MD   1 year ago Essential hypertension   [place] [facility] [place], [place], NP   1 year ago Essential hypertension   [place] [facility] Marcine Matar, MD   1 year ago Essential hypertension   [place] [facility] Marcine Matar, MD       Future Appointments             In 2 days Marcine Matar, MD [facility]

## 2021-01-15 ENCOUNTER — Other Ambulatory Visit: Payer: Self-pay

## 2021-01-16 ENCOUNTER — Other Ambulatory Visit: Payer: Self-pay

## 2021-01-16 ENCOUNTER — Ambulatory Visit: Payer: Self-pay | Admitting: Internal Medicine

## 2021-01-20 ENCOUNTER — Other Ambulatory Visit: Payer: Self-pay

## 2021-01-31 ENCOUNTER — Other Ambulatory Visit: Payer: Self-pay | Admitting: Internal Medicine

## 2021-01-31 DIAGNOSIS — I10 Essential (primary) hypertension: Secondary | ICD-10-CM

## 2021-01-31 MED ORDER — HYDROCHLOROTHIAZIDE 25 MG PO TABS
ORAL_TABLET | Freq: Every day | ORAL | 0 refills | Status: DC
  Filled 2021-01-31: qty 30, 30d supply, fill #0

## 2021-01-31 MED ORDER — AMLODIPINE BESYLATE 10 MG PO TABS
ORAL_TABLET | Freq: Every day | ORAL | 0 refills | Status: DC
  Filled 2021-01-31: qty 30, 30d supply, fill #0

## 2021-02-03 ENCOUNTER — Other Ambulatory Visit: Payer: Self-pay

## 2021-02-05 ENCOUNTER — Other Ambulatory Visit: Payer: Self-pay | Admitting: Internal Medicine

## 2021-02-05 ENCOUNTER — Other Ambulatory Visit: Payer: Self-pay

## 2021-02-05 DIAGNOSIS — E785 Hyperlipidemia, unspecified: Secondary | ICD-10-CM

## 2021-02-05 DIAGNOSIS — I10 Essential (primary) hypertension: Secondary | ICD-10-CM

## 2021-02-05 MED ORDER — ATORVASTATIN CALCIUM 10 MG PO TABS
10.0000 mg | ORAL_TABLET | Freq: Every day | ORAL | 2 refills | Status: DC
  Filled 2021-02-05: qty 30, 30d supply, fill #0
  Filled 2021-03-04: qty 30, 30d supply, fill #1
  Filled 2021-04-02: qty 30, 30d supply, fill #2

## 2021-02-05 MED ORDER — METOPROLOL SUCCINATE ER 100 MG PO TB24
ORAL_TABLET | Freq: Every day | ORAL | 2 refills | Status: DC
  Filled 2021-02-05: qty 30, 30d supply, fill #0
  Filled 2021-03-04: qty 30, 30d supply, fill #1
  Filled 2021-04-02: qty 30, 30d supply, fill #2

## 2021-02-05 NOTE — Telephone Encounter (Signed)
Requested Prescriptions  Pending Prescriptions Disp Refills  . metoprolol succinate (TOPROL-XL) 100 MG 24 hr tablet 30 tablet 2    Sig: Take 1 tablet by mouth daily.     Cardiovascular:  Beta Blockers Failed - 02/05/2021  4:44 PM      Failed - Last BP in normal range    BP Readings from Last 1 Encounters:  11/24/20 (!) 142/85         Passed - Last Heart Rate in normal range    Pulse Readings from Last 1 Encounters:  11/24/20 69         Passed - Valid encounter within last 6 months    Recent Outpatient Visits          6 months ago Essential hypertension   [place] [facility] Marcine Matar, MD   10 months ago Essential hypertension   [place] [facility] Marcine Matar, MD   1 year ago Essential hypertension   [place] [facility] [place], [place], NP   1 year ago Essential hypertension   [place] [facility] Marcine Matar, MD   2 years ago Essential hypertension   [place] [facility] Marcine Matar, MD      Future Appointments            In 1 week Marcine Matar, MD [facility]           . atorvastatin (LIPITOR) 10 MG tablet 30 tablet 2    Sig: Take 1 tablet (10 mg total) by mouth daily.     Cardiovascular:  Antilipid - Statins Failed - 02/05/2021  4:44 PM      Failed - Total Cholesterol in normal range and within 360 days    Cholesterol, Total  Date Value Ref Range Status  12/15/2019 158 100 - 199 mg/dL Final         Failed - LDL in normal range and within 360 days    LDL Chol Calc (NIH)  Date Value Ref Range Status  12/15/2019 94 0 - 99 mg/dL Final         Failed - HDL in normal range and within 360 days    HDL  Date Value Ref Range Status  12/15/2019 37 (L) >39 mg/dL Final         Failed - Triglycerides in normal range and within 360 days    Triglycerides  Date Value Ref Range Status  12/15/2019  154 (H) 0 - 149 mg/dL Final         Passed - Patient is not pregnant      Passed - Valid encounter within last 12 months    Recent Outpatient Visits          6 months ago Essential hypertension   [place] [facility] Marcine Matar, MD   10 months ago Essential hypertension   [place] [facility] Marcine Matar, MD   1 year ago Essential hypertension   [place] [facility] [place], [place], NP   1 year ago Essential hypertension   [place] [facility] Marcine Matar, MD   2 years ago Essential hypertension   [place] [facility] Marcine Matar, MD      Future Appointments            In  1 week Marcine Matar, MD [facility]

## 2021-02-06 ENCOUNTER — Other Ambulatory Visit: Payer: Self-pay

## 2021-02-18 ENCOUNTER — Ambulatory Visit: Payer: Self-pay | Attending: Internal Medicine | Admitting: Internal Medicine

## 2021-02-18 ENCOUNTER — Encounter: Payer: Self-pay | Admitting: Internal Medicine

## 2021-02-18 ENCOUNTER — Other Ambulatory Visit: Payer: Self-pay

## 2021-02-18 VITALS — BP 132/82 | HR 80 | Temp 98.3°F | Ht 71.0 in | Wt 250.8 lb

## 2021-02-18 DIAGNOSIS — R079 Chest pain, unspecified: Secondary | ICD-10-CM

## 2021-02-18 DIAGNOSIS — E785 Hyperlipidemia, unspecified: Secondary | ICD-10-CM

## 2021-02-18 DIAGNOSIS — I10 Essential (primary) hypertension: Secondary | ICD-10-CM

## 2021-02-18 DIAGNOSIS — M79672 Pain in left foot: Secondary | ICD-10-CM

## 2021-02-18 DIAGNOSIS — M1712 Unilateral primary osteoarthritis, left knee: Secondary | ICD-10-CM

## 2021-02-18 NOTE — Progress Notes (Signed)
Patient ID: Damon Peck, male    DOB: Mar 22, 1966  MRN: [id number]  CC: Hypertension   Subjective: Damon Peck is a 55 y.o. male who presents for chronic disease management His concerns today include:  [name], HL, obesity, OA LT knee, COVID-19 infection 05/2019.    HYPERTENSION Currently taking: see medication list.  He is on amlodipine, HCTZ and metoprolol Med Adherence: [x]  Yes    []  No Medication side effects: []  Yes    [x]  No Adherence with salt restriction: [x]  Yes    []  No Home Monitoring?: [x]  Yes    []  No Monitoring Frequency: 3-4x/wk Home BP results range: 120/80 was highest SOB? []  Yes    [x]  No Chest Pain?: [x]  Yes -LT upper chest daily x 2 mths.  Seen in ER for same 11/24/2020. Sometimes it radiates to LT arm.  No SOB.  Sharp pain. Pain more at rest.  Does a lot of walking.  Not much pain with activity. Does not smoke.  Fhx of HD in brother who died at age 46.   Leg swelling?: []  Yes    [x]  No Headaches?: []  Yes    [x]  No Dizziness? []  Yes    [x]  No Comments:   HL: taking and tolerating Lipitor  OA LT: pain in knee not too bad.  He tries to stay active.  He takes the tramadol as needed with good relief. Gets pain in LT heel sometimes with prolong standing and walking.  He has been doing some heel exercises with stretching bands.  At times he also rubs his heel over a cold bottle of water. Patient Active Problem List   Diagnosis Date Noted   Abnormal CT of the head 05/30/2018   Osteoarthritis of left knee 09/14/2017   It band syndrome, right 12/30/2016   Knee mass, left 12/09/2016   Obesity (BMI 30-39.9) 08/06/2016   Gastroesophageal reflux disease 04/26/2016   Midline low back pain without sciatica 08/09/2014   Dyslipidemia 04/24/2013   HTN (hypertension) 12/15/2012     Current Outpatient Medications on File Prior to Visit  Medication Sig Dispense Refill   amLODipine (NORVASC) 10 MG tablet Take 1 tablet by mouth daily. 30 tablet 0    atorvastatin (LIPITOR) 10 MG tablet Take 1 tablet (10 mg total) by mouth daily. 30 tablet 2   cyclobenzaprine (FLEXERIL) 5 MG tablet Take 1-2 tablets (5-10 mg total) by mouth 2 (two) times daily as needed for muscle spasms. 24 tablet 0   hydrochlorothiazide (HYDRODIURIL) 25 MG tablet Take 1 tablet by mouth daily. 30 tablet 0   meloxicam (MOBIC) 7.5 MG tablet Take 1 tablet (7.5 mg total) by mouth daily. 30 tablet 0   metoprolol succinate (TOPROL-XL) 100 MG 24 hr tablet Take 1 tablet by mouth daily. 30 tablet 2   traMADol (ULTRAM) 50 MG tablet Take 1tablet by mouth every 12 (twelve) hours as needed. 60 tablet 0   No current facility-administered medications on file prior to visit.    No Known Allergies  Social History   Socioeconomic History   Marital status: Married    Spouse name: Not on file   Number of children: Not on file   Years of education: Not on file   Highest education level: Not on file  Occupational History   Not on file  Tobacco Use   Smoking status: Never   Smokeless tobacco: Never  Vaping Use   Vaping Use: Never used  Substance and Sexual Activity  Alcohol use: No   Drug use: No   Sexual activity: Yes    Birth control/protection: Condom  Other Topics Concern   Not on file  Social History Narrative   Not on file   Social Determinants of Health   Financial Resource Strain: Not on file  Food Insecurity: Not on file  Transportation Needs: Not on file  Physical Activity: Not on file  Stress: Not on file  Social Connections: Not on file  Intimate Partner Violence: Not on file    Family History  Problem Relation Age of Onset   Diabetes Mother    Hypertension Mother    Diabetes Father    Hypertension Father     Past Surgical History:  Procedure Laterality Date   COLONOSCOPY WITH PROPOFOL N/A 02/11/2015   Procedure: COLONOSCOPY WITH PROPOFOL;  Surgeon: Charolett Bumpers, MD;  Location: [facility];  Service: Endoscopy;  Laterality: N/A;     ROS: Review of Systems Negative except as stated above  PHYSICAL EXAM: BP 132/82   Pulse 80   Temp 98.3 F (36.8 C) (Oral)   Ht 5\' 11"  (1.803 m)   Wt 250 lb 12.8 oz (113.8 kg)   SpO2 98%   BMI 34.98 kg/m   Physical Exam   General appearance - alert, well appearing, and in no distress Mental status - normal mood, behavior, speech, dress, motor activity, and thought processes Neck - supple, no significant adenopathy Chest - clear to auscultation, no wheezes, rales or rhonchi, symmetric air entry Heart - normal rate, regular rhythm, normal S1, S2, no murmurs, rubs, clicks or gallops Musculoskeletal -no point tenderness on palpation of the left upper chest wall.  He has good range of motion at the left shoulder. Left heel: He has mild tenderness on palpation of the plantar surface of the left heel. Left knee: Mild joint enlargement.  No point tenderness.  Mild crepitus on passive range of motion Extremities - peripheral pulses normal, no pedal edema, no clubbing or cyanosis  CMP Latest Ref Rng & Units 11/24/2020 12/15/2019 10/11/2018  Glucose 70 - 99 mg/dL 12/11/2018) 80 124(P)  BUN 6 - 20 mg/dL 14 17 18   Creatinine 0.61 - 1.24 mg/dL 809(X 8.33)  Sodium 135 - 145 mmol/L 139 140 141  Potassium 3.5 - 5.1 mmol/L 3.6 4.5 4.4  Chloride 98 - 111 mmol/L 100 100 101  CO2 22 - 32 mmol/L 29 26 25   Calcium 8.9 - 10.3 mg/dL 9.3 9.7 9.2  Total Protein 6.0 - 8.5 g/dL - 7.5 7.0  Total Bilirubin 0.0 - 1.2 mg/dL - 0.3 0.3  Alkaline Phos 48 - 121 IU/L - 55 51  AST 0 - 40 IU/L - 18 16  ALT 0 - 44 IU/L - 23 27   Lipid Panel     Component Value Date/Time   CHOL 158 12/15/2019 1531   TRIG 154 (H) 12/15/2019 1531   HDL 37 (L) 12/15/2019 1531   CHOLHDL 4.3 12/15/2019 1531   CHOLHDL 3.9 04/24/2016 1619   VLDL 33 (H) 04/24/2016 1619   LDLCALC 94 12/15/2019 1531    CBC    Component Value Date/Time   WBC 9.0 11/24/2020 1631   RBC 5.12 11/24/2020 1631   HGB 13.9 11/24/2020 1631   HGB  14.0 12/15/2019 1531   HCT 42.7 11/24/2020 1631   HCT 43.0 12/15/2019 1531   PLT 249 11/24/2020 1631   PLT 240 10/11/2018 1010   MCV 83.4 11/24/2020 1631   MCV 82 12/15/2019 1531  MCH 27.1 11/24/2020 1631   MCHC 32.6 11/24/2020 1631   RDW 13.8 11/24/2020 1631   RDW 13.9 12/15/2019 1531   LYMPHSABS 2.4 12/15/2019 1531   EOSABS 0.4 12/15/2019 1531   BASOSABS 0.1 12/15/2019 1531    ASSESSMENT AND PLAN: 1. Essential hypertension Close to goal.  Continue current medications and low-salt diet  2. Chest pain in adult Seems atypical but he does have risk factors including family history of early CAD.  Will refer to cardiology - Ambulatory referral to Cardiology  3. Dyslipidemia Continue atorvastatin  4. Pain of left heel I recommend getting an x-ray of the foot to rule out bone spur - DG Foot Complete Left; Future  5. Osteoarthritis of left knee, unspecified osteoarthritis type Encouraged him to try to get his weight down. He uses the tramadol as needed with good results and no significant side effects.  He is functional.    Patient was given the opportunity to ask questions.  Patient verbalized understanding of the plan and was able to repeat key elements of the plan.   Orders Placed This Encounter  Procedures   DG Foot Complete Left   Ambulatory referral to Cardiology     Requested Prescriptions    No prescriptions requested or ordered in this encounter    Return in about 4 months (around 06/20/2021).  [name], MD, FACP

## 2021-02-20 ENCOUNTER — Emergency Department (HOSPITAL_BASED_OUTPATIENT_CLINIC_OR_DEPARTMENT_OTHER)
Admission: EM | Admit: 2021-02-20 | Discharge: 2021-02-20 | Disposition: A | Discharge status: Home / Self Care | Payer: Self-pay | Attending: Emergency Medicine | Admitting: Emergency Medicine

## 2021-02-20 ENCOUNTER — Other Ambulatory Visit: Payer: Self-pay

## 2021-02-20 ENCOUNTER — Emergency Department (HOSPITAL_BASED_OUTPATIENT_CLINIC_OR_DEPARTMENT_OTHER): Payer: Self-pay

## 2021-02-20 ENCOUNTER — Encounter (HOSPITAL_BASED_OUTPATIENT_CLINIC_OR_DEPARTMENT_OTHER): Payer: Self-pay | Admitting: *Deleted

## 2021-02-20 DIAGNOSIS — R079 Chest pain, unspecified: Secondary | ICD-10-CM | POA: Insufficient documentation

## 2021-02-20 DIAGNOSIS — Z79899 Other long term (current) drug therapy: Secondary | ICD-10-CM | POA: Insufficient documentation

## 2021-02-20 DIAGNOSIS — I10 Essential (primary) hypertension: Secondary | ICD-10-CM | POA: Insufficient documentation

## 2021-02-20 DIAGNOSIS — Z20822 Contact with and (suspected) exposure to covid-19: Secondary | ICD-10-CM | POA: Insufficient documentation

## 2021-02-20 LAB — BASIC METABOLIC PANEL
Anion gap: 12 (ref 5–15)
BUN: 15 mg/dL (ref 6–20)
CO2: 26 mmol/L (ref 22–32)
Calcium: 8.9 mg/dL (ref 8.9–10.3)
Chloride: 96 mmol/L — ABNORMAL LOW (ref 98–111)
Creatinine, Ser: 0.65 mg/dL (ref 0.61–1.24)
GFR, Estimated: >60 mL/min (ref >60)
Glucose, Bld: 134 mg/dL — ABNORMAL HIGH (ref 70–99)
Potassium: 3.3 mmol/L — ABNORMAL LOW (ref 3.5–5.1)
Sodium: 134 mmol/L — ABNORMAL LOW (ref 135–145)

## 2021-02-20 LAB — TROPONIN I (HIGH SENSITIVITY)
Troponin I (High Sensitivity): 3 ng/L (ref <18)
Troponin I (High Sensitivity): 4 ng/L (ref <18)

## 2021-02-20 LAB — CBC WITH DIFFERENTIAL/PLATELET
Abs Immature Granulocytes: 0.05 10*3/uL (ref 0.00–0.07)
Basophils Absolute: 0.1 10*3/uL (ref 0.0–0.1)
Basophils Relative: 1 %
Eosinophils Absolute: 0.4 10*3/uL (ref 0.0–0.5)
Eosinophils Relative: 3 %
HCT: 41.6 % (ref 39.0–52.0)
Hemoglobin: 14.1 g/dL (ref 13.0–17.0)
Immature Granulocytes: 1 %
Lymphocytes Relative: 19 %
Lymphs Abs: 2.1 10*3/uL (ref 0.7–4.0)
MCH: 27.9 pg (ref 26.0–34.0)
MCHC: 33.9 g/dL (ref 30.0–36.0)
MCV: 82.2 fL (ref 80.0–100.0)
Monocytes Absolute: 1 10*3/uL (ref 0.1–1.0)
Monocytes Relative: 9 %
Neutro Abs: 7.4 10*3/uL (ref 1.7–7.7)
Neutrophils Relative %: 67 %
Platelets: 255 10*3/uL (ref 150–400)
RBC: 5.06 MIL/uL (ref 4.22–5.81)
RDW: 13.9 % (ref 11.5–15.5)
WBC: 11 10*3/uL — ABNORMAL HIGH (ref 4.0–10.5)
nRBC: 0 % (ref 0.0–0.2)

## 2021-02-20 LAB — RESP PANEL BY RT-PCR (FLU A&B, COVID) ARPGX2
Influenza A by PCR: NEGATIVE
Influenza B by PCR: NEGATIVE
SARS Coronavirus 2 by RT PCR: NEGATIVE

## 2021-02-20 LAB — LIPASE, BLOOD: Lipase: 33 U/L (ref 11–51)

## 2021-02-20 LAB — HEPATIC FUNCTION PANEL
ALT: 24 U/L (ref 0–44)
AST: 20 U/L (ref 15–41)
Albumin: 4.2 g/dL (ref 3.5–5.0)
Alkaline Phosphatase: 50 U/L (ref 38–126)
Bilirubin, Direct: 0.1 mg/dL (ref 0.0–0.2)
Indirect Bilirubin: 0.4 mg/dL (ref 0.3–0.9)
Total Bilirubin: 0.5 mg/dL (ref 0.3–1.2)
Total Protein: 7.7 g/dL (ref 6.5–8.1)

## 2021-02-20 NOTE — ED Provider Notes (Signed)
[facility] EMERGENCY DEPARTMENT Provider Note   CSN: [id number] Arrival date & time: 02/20/21  1511     History Chief Complaint  Patient presents with   Chest Pain    Damon Peck is a 55 y.o. male.  The history is provided by the patient.  Chest Pain Pain location:  L chest Pain quality: sharp   Pain radiates to:  Does not radiate Pain severity:  Moderate Onset quality:  Gradual Duration: 3. Timing:  Intermittent Progression:  Waxing and waning Chronicity:  Recurrent Context: raising an arm   Relieved by:  Nothing Worsened by:  Movement Associated symptoms: no abdominal pain, no anxiety, no back pain, no cough, no fatigue, no fever, no palpitations, no shortness of breath and no vomiting   Risk factors: high cholesterol and hypertension   Risk factors: no coronary artery disease and no smoking       Past Medical History:  Diagnosis Date   Hyperlipidemia 06/02/2018   Hypertension     Patient Active Problem List   Diagnosis Date Noted   Abnormal CT of the head 05/30/2018   Osteoarthritis of left knee 09/14/2017   It band syndrome, right 12/30/2016   Knee mass, left 12/09/2016   Obesity (BMI 30-39.9) 08/06/2016   Gastroesophageal reflux disease 04/26/2016   Midline low back pain without sciatica 08/09/2014   Dyslipidemia 04/24/2013   HTN (hypertension) 12/15/2012    Past Surgical History:  Procedure Laterality Date   COLONOSCOPY WITH PROPOFOL N/A 02/11/2015   Procedure: COLONOSCOPY WITH PROPOFOL;  Surgeon: Charolett Bumpers, MD;  Location: [facility];  Service: Endoscopy;  Laterality: N/A;       Family History  Problem Relation Age of Onset   Diabetes Mother    Hypertension Mother    Diabetes Father    Hypertension Father     Social History   Tobacco Use   Smoking status: Never   Smokeless tobacco: Never  Vaping Use   Vaping Use: Never used  Substance Use Topics   Alcohol use: No   Drug use: No    Home Medications Prior to  Admission medications   Medication Sig Start Date End Date Taking? Authorizing Provider  amLODipine (NORVASC) 10 MG tablet Take 1 tablet by mouth daily. 01/31/21 01/31/22 Yes Marcine Matar, MD  atorvastatin (LIPITOR) 10 MG tablet Take 1 tablet (10 mg total) by mouth daily. 02/05/21 03/08/21 Yes Marcine Matar, MD  hydrochlorothiazide (HYDRODIURIL) 25 MG tablet Take 1 tablet by mouth daily. 01/31/21 01/31/22 Yes Marcine Matar, MD  meloxicam (MOBIC) 7.5 MG tablet Take 1 tablet (7.5 mg total) by mouth daily. 12/15/19  Yes Zonia Kief, Amy J, NP  metoprolol succinate (TOPROL-XL) 100 MG 24 hr tablet Take 1 tablet by mouth daily. 02/05/21 02/05/22 Yes Marcine Matar, MD  traMADol (ULTRAM) 50 MG tablet Take 1tablet by mouth every 12 (twelve) hours as needed. 01/14/21 07/15/21 Yes Marcine Matar, MD  cyclobenzaprine (FLEXERIL) 5 MG tablet Take 1-2 tablets (5-10 mg total) by mouth 2 (two) times daily as needed for muscle spasms. 10/11/19   Wieters, Hallie C, PA-C    Allergies    Patient has no known allergies.  Review of Systems   Review of Systems  Constitutional:  Negative for chills, fatigue and fever.  HENT:  Negative for ear pain and sore throat.   Eyes:  Negative for pain and visual disturbance.  Respiratory:  Negative for cough and shortness of breath.   Cardiovascular:  Positive for chest  pain. Negative for palpitations.  Gastrointestinal:  Negative for abdominal pain and vomiting.  Genitourinary:  Negative for dysuria and hematuria.  Musculoskeletal:  Negative for arthralgias and back pain.  Skin:  Negative for color change and rash.  Neurological:  Negative for seizures and syncope.  All other systems reviewed and are negative.  Physical Exam Updated Vital Signs BP 105/78   Pulse 63   Temp 99.3 F (37.4 C) (Oral)   Resp 16   Ht 5\' 11"  (1.803 m)   Wt 113.8 kg   SpO2 95%   BMI 34.99 kg/m   Physical Exam Vitals and nursing note reviewed.  Constitutional:      Appearance:  He is well-developed.  HENT:     Head: Normocephalic and atraumatic.  Eyes:     Extraocular Movements: Extraocular movements intact.     Conjunctiva/sclera: Conjunctivae normal.     Pupils: Pupils are equal, round, and reactive to light.  Cardiovascular:     Rate and Rhythm: Normal rate and regular rhythm.     Pulses:          Radial pulses are 2+ on the right side and 2+ on the left side.     Heart sounds: No murmur heard. Pulmonary:     Effort: Pulmonary effort is normal. No respiratory distress.     Breath sounds: Normal breath sounds.  Chest:     Chest wall: Tenderness present.  Abdominal:     Palpations: Abdomen is soft.     Tenderness: There is no abdominal tenderness.  Musculoskeletal:        General: Normal range of motion.     Cervical back: Normal range of motion and neck supple.     Right lower leg: No edema.     Left lower leg: No edema.  Skin:    General: Skin is warm and dry.     Capillary Refill: Capillary refill takes less than 2 seconds.  Neurological:     General: No focal deficit present.     Mental Status: He is alert.  Psychiatric:        Mood and Affect: Mood normal.    ED Results / Procedures / Treatments   Labs (all labs ordered are listed, but only abnormal results are displayed) Labs Reviewed  CBC WITH DIFFERENTIAL/PLATELET - Abnormal; Notable for the following components:      Result Value   WBC 11.0 (*)    All other components within normal limits  BASIC METABOLIC PANEL - Abnormal; Notable for the following components:   Sodium 134 (*)    Potassium 3.3 (*)    Chloride 96 (*)    Glucose, Bld 134 (*)    All other components within normal limits  RESP PANEL BY RT-PCR (FLU A&B, COVID) ARPGX2  HEPATIC FUNCTION PANEL  LIPASE, BLOOD  TROPONIN I (HIGH SENSITIVITY)  TROPONIN I (HIGH SENSITIVITY)    EKG EKG Interpretation  Date/Time:  Thursday February 20 2021 15:27:48 EDT Ventricular Rate:  75 PR Interval:  220 QRS Duration: 96 QT  Interval:  404 QTC Calculation: 451 R Axis:   35 Text Interpretation: Sinus rhythm with 1st degree A-V block Otherwise normal ECG Confirmed by [id number]) on 02/20/2021 3:29:43 PM  Radiology DG Chest Portable 1 View  Result Date: 02/20/2021 CLINICAL DATA:  Chest pain and weakness for 2 days. EXAM: PORTABLE CHEST 1 VIEW COMPARISON:  Chest x-ray 11/24/2020 FINDINGS: The heart and mediastinal contours are unchanged. No focal consolidation. No pulmonary edema.  No pleural effusion. No pneumothorax. No acute osseous abnormality. IMPRESSION: No active disease. Electronically Signed   By: Tish Frederickson M.D.   On: 02/20/2021 16:13    Procedures Procedures   Medications Ordered in ED Medications - No data to display  ED Course  I have reviewed the triage vital signs and the nursing notes.  Pertinent labs & imaging results that were available during my care of the patient were reviewed by me and considered in my medical decision making (see chart for details).    MDM Rules/Calculators/A&P                           Damon Peck is a 55 year old male with history of hypertension, high cholesterol presents to the ED with chest pain.  Overall unremarkable vitals.  He states that he has had this similar chest pain on and off for the last 3 months.  He does work a Youth worker job.  States that it is worse when he is lifting things.  He has pinpoint tenderness over the left side of his chest wall.  Denies any diaphoresis, radiation of the pain, shortness of breath, cough, sputum production.  Patient has heart score of 3.  EKG shows sinus rhythm with first-degree heart block with no ischemic changes.  EKG unchanged from prior.  Does have some cardiac risk factors and do suspect that likely this is muscular but will get troponins, basic labs, chest x-ray.  No concern for PE or dissection.  Clear breath sounds.  Does have follow-up with cardiology in place later this year.  Has never had a stress test  or other provocative cardiac work-up.  Troponins negative x2.  No significant anemia, electrolyte issue, kidney injury.  No pneumonia or pneumothorax.  COVID test is negative.  Overall heart score is 3, atypical story for ACS.  My suspicion that this is from repetitive movement and lifting and likely muscular in nature.  Understands return precautions and we will have him follow-up with cardiology as he does have some cardiac risk factors and would likely benefit from a further cardiac rule out.  This chart was dictated using voice recognition software.  Despite best efforts to proofread,  errors can occur which can change the documentation meaning.   Final Clinical Impression(s) / ED Diagnoses Final diagnoses:  Nonspecific chest pain    Rx / DC Orders ED Discharge Orders     None        Virgina Norfolk, DO 02/20/21 1820

## 2021-02-20 NOTE — ED Triage Notes (Signed)
Chest pain and weakness x 2 days. No known injury. No cardiac hx. EKG at triage.

## 2021-02-20 NOTE — Discharge Instructions (Addendum)
Overall suspect that this is muscular chest pain.  Take 1000 mg of Tylenol every 6 hours as needed for pain.  Take 600 mg ibuprofen every 8 hours as needed for pain.  If pain worsens or changes or becomes more concerning please get reevaluated.  Follow-up with cardiology to further rule out cardiac process although this is thought to be less likely.

## 2021-03-04 ENCOUNTER — Other Ambulatory Visit: Payer: Self-pay

## 2021-03-04 ENCOUNTER — Other Ambulatory Visit: Payer: Self-pay | Admitting: Internal Medicine

## 2021-03-04 DIAGNOSIS — I10 Essential (primary) hypertension: Secondary | ICD-10-CM

## 2021-03-04 DIAGNOSIS — M1712 Unilateral primary osteoarthritis, left knee: Secondary | ICD-10-CM

## 2021-03-05 ENCOUNTER — Other Ambulatory Visit: Payer: Self-pay

## 2021-03-05 MED ORDER — HYDROCHLOROTHIAZIDE 25 MG PO TABS
ORAL_TABLET | Freq: Every day | ORAL | 0 refills | Status: DC
Start: 2021-03-05 — End: 2021-06-02
  Filled 2021-03-05: qty 30, 30d supply, fill #0
  Filled 2021-04-02: qty 30, 30d supply, fill #1
  Filled 2021-05-03: qty 30, 30d supply, fill #2

## 2021-03-05 MED ORDER — AMLODIPINE BESYLATE 10 MG PO TABS
ORAL_TABLET | Freq: Every day | ORAL | 0 refills | Status: DC
  Filled 2021-03-05: qty 30, 30d supply, fill #0
  Filled 2021-04-02: qty 30, 30d supply, fill #1
  Filled 2021-05-03: qty 30, 30d supply, fill #2

## 2021-03-05 NOTE — Telephone Encounter (Signed)
Requested medications are due for refill today.  yes  Requested medications are on the active medications list.  yes  Last refill. 01/14/2021  Future visit scheduled.   yes  Notes to clinic.  Medication not delegated. 

## 2021-03-05 NOTE — Telephone Encounter (Signed)
Requested Prescriptions  Pending Prescriptions Disp Refills  . traMADol (ULTRAM) 50 MG tablet 60 tablet 0    Sig: Take 1tablet by mouth every 12 (twelve) hours as needed.     Not Delegated - Analgesics:  Opioid Agonists Failed - 03/04/2021 10:05 AM      Failed - This refill cannot be delegated      Failed - Urine Drug Screen completed in last 360 days      Passed - Valid encounter within last 6 months    Recent Outpatient Visits          2 weeks ago Essential hypertension   [place] Marcine Matar, MD   6 months ago Essential hypertension   [place] [facility] Marcine Matar, MD   11 months ago Essential hypertension   [facility] Marcine Matar, MD   1 year ago Essential hypertension   [place] [facility] [place], [place], NP   1 year ago Essential hypertension   [place] [facility] Marcine Matar, MD      Future Appointments            In 2 months Herbie [place] Piedad Climes, MD [facility] [place], [facility]   In 3 months Marcine Matar, MD [facility]           . amLODipine (NORVASC) 10 MG tablet 90 tablet 0    Sig: Take 1 tablet by mouth daily.     Cardiovascular:  Calcium Channel Blockers Passed - 03/04/2021 10:05 AM      Passed - Last BP in normal range    BP Readings from Last 1 Encounters:  02/20/21 105/78         Passed - Valid encounter within last 6 months    Recent Outpatient Visits          2 weeks ago Essential hypertension   [place] [facility] Marcine Matar, MD   6 months ago Essential hypertension   [place] [facility] Marcine Matar, MD   11 months ago Essential hypertension   [facility] Marcine Matar, MD   1 year ago Essential hypertension   [place] [facility]  [place], [place], NP   1 year ago Essential hypertension   [place] [facility] Marcine Matar, MD      Future Appointments            In 2 months Herbie [place] Piedad Climes, MD [facility] [place], [facility]   In 3 months Marcine Matar, MD [facility]           . hydrochlorothiazide (HYDRODIURIL) 25 MG tablet 90 tablet 0    Sig: Take 1 tablet by mouth daily.     Cardiovascular: Diuretics - Thiazide Failed - 03/04/2021 10:05 AM      Failed - K in normal range and within 360 days    Potassium  Date Value Ref Range Status  02/20/2021 3.3 (L) 3.5 - 5.1 mmol/L Final         Failed - Na in normal range and within 360 days    Sodium  Date Value Ref Range Status  02/20/2021 134 (L) 135 - 145 mmol/L Final  12/15/2019 140 134 - 144 mmol/L Final  Passed - Ca in normal range and within 360 days    Calcium  Date Value Ref Range Status  02/20/2021 8.9 8.9 - 10.3 mg/dL Final         Passed - Cr in normal range and within 360 days    Creat  Date Value Ref Range Status  04/24/2016 0.86 0.70 - 1.33 mg/dL Final    Comment:      For patients > or = 57 years of age: The upper reference limit for Creatinine is approximately 13% higher for people identified as African-American.      Creatinine, Ser  Date Value Ref Range Status  02/20/2021 0.65 0.61 - 1.24 mg/dL Final         Passed - Last BP in normal range    BP Readings from Last 1 Encounters:  02/20/21 105/78         Passed - Valid encounter within last 6 months    Recent Outpatient Visits          2 weeks ago Essential hypertension   [place] [facility] Marcine Matar, MD   6 months ago Essential hypertension   [place] [facility] Marcine Matar, MD   11 months ago Essential hypertension   [facility] Marcine Matar, MD   1 year ago Essential hypertension   [place]  [facility] [place], [place], NP   1 year ago Essential hypertension    [facility] Marcine Matar, MD      Future Appointments            In 2 months Herbie [place] Piedad Climes, MD [facility], [facility]   In 3 months Marcine Matar, MD [facility]

## 2021-03-06 ENCOUNTER — Other Ambulatory Visit: Payer: Self-pay

## 2021-03-12 ENCOUNTER — Ambulatory Visit
Admission: RE | Admit: 2021-03-12 | Discharge: 2021-03-12 | Disposition: A | Discharge status: Home / Self Care | Payer: Self-pay | Source: Ambulatory Visit | Attending: Internal Medicine | Admitting: Internal Medicine

## 2021-03-12 DIAGNOSIS — M79672 Pain in left foot: Secondary | ICD-10-CM

## 2021-03-14 ENCOUNTER — Other Ambulatory Visit: Payer: Self-pay

## 2021-03-14 ENCOUNTER — Other Ambulatory Visit: Payer: Self-pay | Admitting: Internal Medicine

## 2021-03-14 DIAGNOSIS — M7732 Calcaneal spur, left foot: Secondary | ICD-10-CM

## 2021-03-14 NOTE — Progress Notes (Signed)
Referred to podiatry for heel spur LT.

## 2021-03-19 ENCOUNTER — Telehealth: Payer: Self-pay

## 2021-03-19 NOTE — Telephone Encounter (Signed)
Contacted pt to go over xray results pt is aware and doesn't have any questions or concerns  

## 2021-03-20 ENCOUNTER — Other Ambulatory Visit: Payer: Self-pay

## 2021-03-20 ENCOUNTER — Other Ambulatory Visit: Payer: Self-pay | Admitting: Internal Medicine

## 2021-03-20 DIAGNOSIS — M1712 Unilateral primary osteoarthritis, left knee: Secondary | ICD-10-CM

## 2021-03-20 MED ORDER — TRAMADOL HCL 50 MG PO TABS
ORAL_TABLET | Freq: Two times a day (BID) | ORAL | 0 refills | Status: DC | PRN
  Filled 2021-03-20: qty 60, 30d supply, fill #0

## 2021-03-20 NOTE — Telephone Encounter (Signed)
Requested medication (s) are due for refill today: yes  Requested medication (s) are on the active medication list: yes  Last refill:  01/14/21- 01/14/22 #60 0 refills  Future visit scheduled: yes  Notes to clinic:  not delegated per protocol Prescription MME cannot be calculated for this prescription. Enter discrete sig details to calculate prescription MME.     Requested Prescriptions  Pending Prescriptions Disp Refills   traMADol (ULTRAM) 50 MG tablet 60 tablet 0    Sig: Take 1tablet by mouth every 12 (twelve) hours as needed.     Not Delegated - Analgesics:  Opioid Agonists Failed - 03/20/2021  2:29 PM      Failed - This refill cannot be delegated      Failed - Urine Drug Screen completed in last 360 days      Passed - Valid encounter within last 6 months    Recent Outpatient Visits           1 month ago Essential hypertension   [place] [facility] Marcine Matar, MD   7 months ago Essential hypertension   [place] [facility] Marcine Matar, MD   11 months ago Essential hypertension   [facility] Marcine Matar, MD   1 year ago Essential hypertension   [place] [facility] [place], [place], NP   1 year ago Essential hypertension   [place] [facility] Marcine Matar, MD       Future Appointments             In 1 month Herbie [place] Piedad Climes, MD [facility] [place], [facility]   In 3 months Marcine Matar, MD [facility]

## 2021-03-21 ENCOUNTER — Other Ambulatory Visit: Payer: Self-pay

## 2021-03-24 ENCOUNTER — Other Ambulatory Visit (HOSPITAL_COMMUNITY): Payer: Self-pay

## 2021-03-24 ENCOUNTER — Ambulatory Visit (INDEPENDENT_AMBULATORY_CARE_PROVIDER_SITE_OTHER): Payer: No Typology Code available for payment source

## 2021-03-24 ENCOUNTER — Other Ambulatory Visit: Payer: Self-pay

## 2021-03-24 ENCOUNTER — Ambulatory Visit (INDEPENDENT_AMBULATORY_CARE_PROVIDER_SITE_OTHER): Payer: No Typology Code available for payment source | Admitting: Podiatry

## 2021-03-24 DIAGNOSIS — M722 Plantar fascial fibromatosis: Secondary | ICD-10-CM

## 2021-03-24 MED ORDER — MELOXICAM 15 MG PO TABS
15.0000 mg | ORAL_TABLET | Freq: Every day | ORAL | 1 refills | Status: DC
  Filled 2021-03-24: qty 60, 60d supply, fill #0
  Filled 2021-03-27: qty 30, 30d supply, fill #0
  Filled 2021-04-03: qty 60, 60d supply, fill #0

## 2021-03-24 NOTE — Progress Notes (Signed)
   Subjective: 55 y.o. male    Past Medical History:  Diagnosis Date   Hyperlipidemia 06/02/2018   Hypertension      Objective: Physical Exam General: The patient is alert and oriented x3 in no acute distress.  Dermatology: Skin is warm, dry and supple bilateral lower extremities. Negative for open lesions or macerations bilateral.   Vascular: Dorsalis Pedis and Posterior Tibial pulses palpable bilateral.  Capillary fill time is immediate to all digits.  Neurological: Epicritic and protective threshold intact bilateral.   Musculoskeletal: Tenderness to palpation to the plantar aspect of the left heel along the plantar fascia. All other joints range of motion within normal limits bilateral. Strength 5/5 in all groups bilateral.   Radiographic exam: Normal osseous mineralization. Joint spaces preserved. No fracture/dislocation/boney destruction. No other soft tissue abnormalities or radiopaque foreign bodies.   Assessment: 1. Plantar fasciitis left foot  Plan of Care:  1. Patient evaluated. Xrays reviewed.   2.  Currently the pain is minimal and is very tolerable.  Patient states that he only has very mild pain if he is walking for significant amount of length or time. 3.  Prescription for meloxicam 15 mg daily to take as needed 4.  Advised against going barefoot.  Recommend good supportive shoes and sneakers  5.  Return to clinic as needed   Felecia Shelling, DPM [facility]  Dr. Felecia Shelling, DPM    [address] [place], [place] [address]                Office [phone]  Fax [phone]

## 2021-03-27 ENCOUNTER — Other Ambulatory Visit: Payer: Self-pay

## 2021-04-02 ENCOUNTER — Other Ambulatory Visit: Payer: Self-pay

## 2021-04-03 ENCOUNTER — Other Ambulatory Visit: Payer: Self-pay

## 2021-04-04 ENCOUNTER — Other Ambulatory Visit: Payer: Self-pay

## 2021-05-03 ENCOUNTER — Other Ambulatory Visit: Payer: Self-pay | Admitting: Internal Medicine

## 2021-05-03 DIAGNOSIS — E785 Hyperlipidemia, unspecified: Secondary | ICD-10-CM

## 2021-05-03 DIAGNOSIS — I10 Essential (primary) hypertension: Secondary | ICD-10-CM

## 2021-05-03 MED ORDER — METOPROLOL SUCCINATE ER 100 MG PO TB24
ORAL_TABLET | Freq: Every day | ORAL | 0 refills | Status: DC
Start: 2021-05-03 — End: 2021-06-20
  Filled 2021-05-03: qty 30, 30d supply, fill #0
  Filled 2021-06-02: qty 30, 30d supply, fill #1

## 2021-05-03 NOTE — Telephone Encounter (Signed)
Requested Prescriptions  Pending Prescriptions Disp Refills  . atorvastatin (LIPITOR) 10 MG tablet 30 tablet     Sig: Take 1 tablet (10 mg total) by mouth daily.     Cardiovascular:  Antilipid - Statins Failed - 05/03/2021  9:05 AM      Failed - Total Cholesterol in normal range and within 360 days    Cholesterol, Total  Date Value Ref Range Status  12/15/2019 158 100 - 199 mg/dL Final         Failed - LDL in normal range and within 360 days    LDL Chol Calc (NIH)  Date Value Ref Range Status  12/15/2019 94 0 - 99 mg/dL Final         Failed - HDL in normal range and within 360 days    HDL  Date Value Ref Range Status  12/15/2019 37 (L) >39 mg/dL Final         Failed - Triglycerides in normal range and within 360 days    Triglycerides  Date Value Ref Range Status  12/15/2019 154 (H) 0 - 149 mg/dL Final         Passed - Patient is not pregnant      Passed - Valid encounter within last 12 months    Recent Outpatient Visits          2 months ago Essential hypertension   [facility] Marcine Matar, MD   8 months ago Essential hypertension   [facility] Marcine Matar, MD   1 year ago Essential hypertension   [facility] Marcine Matar, MD   1 year ago Essential hypertension   [facility] [place], [place], NP   2 years ago Essential hypertension   [facility] Marcine Matar, MD      Future Appointments            In 1 week Marykay Lex, MD [facility] [place], [facility]   In 1 month Marcine Matar, MD [facility]           . metoprolol succinate (TOPROL-XL) 100 MG 24 hr tablet 90 tablet 0    Sig: Take 1 tablet by mouth daily.     Cardiovascular:  Beta Blockers Passed - 05/03/2021  9:05 AM      Passed - Last BP in normal range    BP Readings from Last 1  Encounters:  02/20/21 105/78         Passed - Last Heart Rate in normal range    Pulse Readings from Last 1 Encounters:  02/20/21 63         Passed - Valid encounter within last 6 months    Recent Outpatient Visits          2 months ago Essential hypertension   [facility] Marcine Matar, MD   8 months ago Essential hypertension   [place] [facility] Marcine Matar, MD   1 year ago Essential hypertension   [facility] Marcine Matar, MD   1 year ago Essential hypertension   [facility] [place], [place], NP   2 years ago Essential hypertension   [facility] Marcine Matar, MD      Future Appointments  In 1 week Marykay Lex, MD [facility] [place], [facility]   In 1 month Laural Benes Binnie Rail, MD [facility]

## 2021-05-03 NOTE — Telephone Encounter (Signed)
Requested medication (s) are due for refill today: yes  Requested medication (s) are on the active medication list: yes  Last refill:  02/05/21 prescription ends 04/25/21  Future visit scheduled: yes  Notes to clinic:  overdue lab work-    Requested Prescriptions  Pending Prescriptions Disp Refills   atorvastatin (LIPITOR) 10 MG tablet 30 tablet     Sig: Take 1 tablet (10 mg total) by mouth daily.     Cardiovascular:  Antilipid - Statins Failed - 05/03/2021  9:05 AM      Failed - Total Cholesterol in normal range and within 360 days    Cholesterol, Total  Date Value Ref Range Status  12/15/2019 158 100 - 199 mg/dL Final          Failed - LDL in normal range and within 360 days    LDL Chol Calc (NIH)  Date Value Ref Range Status  12/15/2019 94 0 - 99 mg/dL Final          Failed - HDL in normal range and within 360 days    HDL  Date Value Ref Range Status  12/15/2019 37 (L) >39 mg/dL Final          Failed - Triglycerides in normal range and within 360 days    Triglycerides  Date Value Ref Range Status  12/15/2019 154 (H) 0 - 149 mg/dL Final          Passed - Patient is not pregnant      Passed - Valid encounter within last 12 months    Recent Outpatient Visits           2 months ago Essential hypertension   [facility] Marcine Matar, MD   8 months ago Essential hypertension   [facility] Marcine Matar, MD   1 year ago Essential hypertension   [facility] Marcine Matar, MD   1 year ago Essential hypertension   [facility] [place], [place], NP   2 years ago Essential hypertension   [facility] Marcine Matar, MD       Future Appointments             In 1 week Marykay Lex, MD [facility], [facility]   In 1 month Marcine Matar, MD [facility]             Signed Prescriptions Disp Refills   metoprolol succinate (TOPROL-XL) 100 MG 24 hr tablet 90 tablet 0    Sig: Take 1 tablet by mouth daily.     Cardiovascular:  Beta Blockers Passed - 05/03/2021  9:05 AM      Passed - Last BP in normal range    BP Readings from Last 1 Encounters:  02/20/21 105/78          Passed - Last Heart Rate in normal range    Pulse Readings from Last 1 Encounters:  02/20/21 63          Passed - Valid encounter within last 6 months    Recent Outpatient Visits           2 months ago Essential hypertension   [facility] Marcine Matar, MD   8 months ago Essential hypertension   [facility] Marcine Matar, MD   1 year  ago Essential hypertension   [facility] [facility] Marcine Matar, MD   1 year ago Essential hypertension   [facility] [place], [place], NP   2 years ago Essential hypertension   [facility] Marcine Matar, MD       Future Appointments             In 1 week Herbie [place] Piedad Climes, MD [facility] [place], [place]   In 1 month Marcine Matar, MD [facility]

## 2021-05-05 ENCOUNTER — Other Ambulatory Visit: Payer: Self-pay

## 2021-05-05 MED ORDER — ATORVASTATIN CALCIUM 10 MG PO TABS
10.0000 mg | ORAL_TABLET | Freq: Every day | ORAL | 2 refills | Status: DC
Start: 2021-05-05 — End: 2021-07-22
  Filled 2021-05-05 (×2): qty 30, 30d supply, fill #0
  Filled 2021-06-02: qty 30, 30d supply, fill #1
  Filled 2021-07-01: qty 30, 30d supply, fill #2

## 2021-05-12 ENCOUNTER — Other Ambulatory Visit: Payer: Self-pay

## 2021-05-12 ENCOUNTER — Ambulatory Visit: Payer: Self-pay | Attending: Internal Medicine

## 2021-05-13 ENCOUNTER — Ambulatory Visit: Payer: Self-pay | Admitting: Cardiology

## 2021-06-02 ENCOUNTER — Other Ambulatory Visit: Payer: Self-pay | Admitting: Internal Medicine

## 2021-06-02 ENCOUNTER — Other Ambulatory Visit: Payer: Self-pay

## 2021-06-02 DIAGNOSIS — I10 Essential (primary) hypertension: Secondary | ICD-10-CM

## 2021-06-02 DIAGNOSIS — M1712 Unilateral primary osteoarthritis, left knee: Secondary | ICD-10-CM

## 2021-06-02 MED ORDER — HYDROCHLOROTHIAZIDE 25 MG PO TABS
ORAL_TABLET | Freq: Every day | ORAL | 0 refills | Status: DC
Start: 2021-06-02 — End: 2021-06-20
  Filled 2021-06-02: qty 30, 30d supply, fill #0

## 2021-06-02 MED ORDER — AMLODIPINE BESYLATE 10 MG PO TABS
ORAL_TABLET | Freq: Every day | ORAL | 0 refills | Status: DC
  Filled 2021-06-02: qty 30, 30d supply, fill #0

## 2021-06-02 MED ORDER — TRAMADOL HCL 50 MG PO TABS
50.0000 mg | ORAL_TABLET | Freq: Two times a day (BID) | ORAL | 0 refills | Status: DC | PRN
  Filled 2021-06-02: qty 60, 30d supply, fill #0

## 2021-06-02 NOTE — Telephone Encounter (Signed)
Will forward to provider  

## 2021-06-20 ENCOUNTER — Other Ambulatory Visit: Payer: Self-pay

## 2021-06-20 ENCOUNTER — Ambulatory Visit: Payer: Self-pay | Attending: Internal Medicine | Admitting: Internal Medicine

## 2021-06-20 ENCOUNTER — Encounter: Payer: Self-pay | Admitting: Internal Medicine

## 2021-06-20 VITALS — BP 135/87 | HR 80 | Resp 16 | Wt 257.8 lb

## 2021-06-20 DIAGNOSIS — I10 Essential (primary) hypertension: Secondary | ICD-10-CM

## 2021-06-20 DIAGNOSIS — Z2821 Immunization not carried out because of patient refusal: Secondary | ICD-10-CM

## 2021-06-20 DIAGNOSIS — Z6835 Body mass index (BMI) 35.0-35.9, adult: Secondary | ICD-10-CM

## 2021-06-20 DIAGNOSIS — E66812 Obesity, class 2: Secondary | ICD-10-CM

## 2021-06-20 DIAGNOSIS — E785 Hyperlipidemia, unspecified: Secondary | ICD-10-CM

## 2021-06-20 DIAGNOSIS — M1712 Unilateral primary osteoarthritis, left knee: Secondary | ICD-10-CM

## 2021-06-20 MED ORDER — HYDROCHLOROTHIAZIDE 25 MG PO TABS
ORAL_TABLET | Freq: Every day | ORAL | 0 refills | Status: DC
  Filled 2021-06-20: qty 90, fill #0
  Filled 2021-07-01: qty 30, 30d supply, fill #0
  Filled 2021-08-03: qty 30, 30d supply, fill #1
  Filled 2021-08-04: qty 30, 30d supply, fill #0
  Filled 2021-08-19: qty 30, 30d supply, fill #1

## 2021-06-20 MED ORDER — METOPROLOL SUCCINATE ER 100 MG PO TB24
ORAL_TABLET | Freq: Every day | ORAL | 0 refills | Status: DC
  Filled 2021-06-20: qty 90, fill #0
  Filled 2021-07-01: qty 30, 30d supply, fill #0
  Filled 2021-08-03: qty 30, 30d supply, fill #1
  Filled 2021-08-04: qty 30, 30d supply, fill #0
  Filled 2021-08-19: qty 30, 30d supply, fill #1

## 2021-06-20 MED ORDER — AMLODIPINE BESYLATE 10 MG PO TABS
ORAL_TABLET | Freq: Every day | ORAL | 0 refills | Status: DC
  Filled 2021-06-20: qty 90, fill #0
  Filled 2021-07-01: qty 30, 30d supply, fill #0
  Filled 2021-08-03: qty 30, 30d supply, fill #1
  Filled 2021-08-04: qty 30, 30d supply, fill #0
  Filled 2021-08-19: qty 30, 30d supply, fill #1

## 2021-06-20 NOTE — Patient Instructions (Signed)
Healthy Eating °Following a healthy eating pattern may help you to achieve and maintain a healthy body weight, reduce the risk of chronic disease, and live a long and productive life. It is important to follow a healthy eating pattern at an appropriate calorie level for your body. Your nutritional needs should be met primarily through food by choosing a variety of nutrient-rich foods. °What are tips for following this plan? °Reading food labels °Read labels and choose the following: °Reduced or low sodium. °Juices with 100% fruit juice. °Foods with low saturated fats and high polyunsaturated and monounsaturated fats. °Foods with whole grains, such as whole wheat, cracked wheat, brown rice, and wild rice. °Whole grains that are fortified with folic acid. This is recommended for women who are pregnant or who want to become pregnant. °Read labels and avoid the following: °Foods with a lot of added sugars. These include foods that contain brown sugar, corn sweetener, corn syrup, dextrose, fructose, glucose, high-fructose corn syrup, honey, invert sugar, lactose, malt syrup, maltose, molasses, raw sugar, sucrose, trehalose, or turbinado sugar. °Do not eat more than the following amounts of added sugar per day: °6 teaspoons (25 g) for women. °9 teaspoons (38 g) for men. °Foods that contain processed or refined starches and grains. °Refined grain products, such as white flour, degermed cornmeal, white bread, and white rice. °Shopping °Choose nutrient-rich snacks, such as vegetables, whole fruits, and nuts. Avoid high-calorie and high-sugar snacks, such as potato chips, fruit snacks, and candy. °Use oil-based dressings and spreads on foods instead of solid fats such as butter, stick margarine, or cream cheese. °Limit pre-made sauces, mixes, and "instant" products such as flavored rice, instant noodles, and ready-made pasta. °Try more plant-protein sources, such as tofu, tempeh, black beans, edamame, lentils, nuts, and  seeds. °Explore eating plans such as the Mediterranean diet or vegetarian diet. °Cooking °Use oil to sauté or stir-fry foods instead of solid fats such as butter, stick margarine, or lard. °Try baking, boiling, grilling, or broiling instead of frying. °Remove the fatty part of meats before cooking. °Steam vegetables in water or broth. °Meal planning ° °At meals, imagine dividing your plate into fourths: °One-half of your plate is fruits and vegetables. °One-fourth of your plate is whole grains. °One-fourth of your plate is protein, especially lean meats, poultry, eggs, tofu, beans, or nuts. °Include low-fat dairy as part of your daily diet. °Lifestyle °Choose healthy options in all settings, including home, work, school, restaurants, or stores. °Prepare your food safely: °Wash your hands after handling raw meats. °Keep food preparation surfaces clean by regularly washing with hot, soapy water. °Keep raw meats separate from ready-to-eat foods, such as fruits and vegetables. °Cook seafood, meat, poultry, and eggs to the recommended internal temperature. °Store foods at safe temperatures. In general: °Keep cold foods at 40°F (4.4°C) or below. °Keep hot foods at 140°F (60°C) or above. °Keep your freezer at 0°F (-17.8°C) or below. °Foods are no longer safe to eat when they have been between the temperatures of 40°-140°F (4.4-60°C) for more than 2 hours. °What foods should I eat? °Fruits °Aim to eat 2 cup-equivalents of fresh, canned (in natural juice), or frozen fruits each day. Examples of 1 cup-equivalent of fruit include 1 small apple, 8 large strawberries, 1 cup canned fruit, ½ cup dried fruit, or 1 cup 100% juice. °Vegetables °Aim to eat 2½-3 cup-equivalents of fresh and frozen vegetables each day, including different varieties and colors. Examples of 1 cup-equivalent of vegetables include 2 medium carrots, 2 cups raw,   leafy greens, 1 cup chopped vegetable (raw or cooked), or 1 medium baked potato. °Grains °Aim to  eat 6 ounce-equivalents of whole grains each day. Examples of 1 ounce-equivalent of grains include 1 slice of bread, 1 cup ready-to-eat cereal, 3 cups popcorn, or ½ cup cooked rice, pasta, or cereal. °Meats and other proteins °Aim to eat 5-6 ounce-equivalents of protein each day. Examples of 1 ounce-equivalent of protein include 1 egg, 1/2 cup nuts or seeds, or 1 tablespoon (16 g) peanut butter. A cut of meat or fish that is the size of a deck of cards is about 3-4 ounce-equivalents. °Of the protein you eat each week, try to have at least 8 ounces come from seafood. This includes salmon, trout, herring, and anchovies. °Dairy °Aim to eat 3 cup-equivalents of fat-free or low-fat dairy each day. Examples of 1 cup-equivalent of dairy include 1 cup (240 mL) milk, 8 ounces (250 g) yogurt, 1½ ounces (44 g) natural cheese, or 1 cup (240 mL) fortified soy milk. °Fats and oils °Aim for about 5 teaspoons (21 g) per day. Choose monounsaturated fats, such as canola and olive oils, avocados, peanut butter, and most nuts, or polyunsaturated fats, such as sunflower, corn, and soybean oils, walnuts, pine nuts, sesame seeds, sunflower seeds, and flaxseed. °Beverages °Aim for six 8-oz glasses of water per day. Limit coffee to three to five 8-oz cups per day. °Limit caffeinated beverages that have added calories, such as soda and energy drinks. °Limit alcohol intake to no more than 1 drink a day for nonpregnant women and 2 drinks a day for men. One drink equals 12 oz of beer (355 mL), 5 oz of wine (148 mL), or 1½ oz of hard liquor (44 mL). °Seasoning and other foods °Avoid adding excess amounts of salt to your foods. Try flavoring foods with herbs and spices instead of salt. °Avoid adding sugar to foods. °Try using oil-based dressings, sauces, and spreads instead of solid fats. °This information is based on general [place] nutrition guidelines. For more information, visit choosemyplate.gov. Exact amounts may vary based on your nutrition  needs. °Summary °A healthy eating plan may help you to maintain a healthy weight, reduce the risk of chronic diseases, and stay active throughout your life. °Plan your meals. Make sure you eat the right portions of a variety of nutrient-rich foods. °Try baking, boiling, grilling, or broiling instead of frying. °Choose healthy options in all settings, including home, work, school, restaurants, or stores. °This information is not intended to replace advice given to you by your health care provider. Make sure you discuss any questions you have with your health care provider. °Document Revised: 02/18/2021 Document Reviewed: 02/18/2021 °Elsevier Patient Education © 2022 Elsevier Inc. ° °

## 2021-06-20 NOTE — Progress Notes (Signed)
Patient ID: Damon Peck, male    DOB: 07/24/1965  MRN: [id number]  CC: Hypertension and Knee Pain (B/l)   Subjective: Damon Peck is a 55 y.o. male who presents for chronic ds management His concerns today include:  [name], HL, obesity, OA LT knee, COVID-19 infection 05/2019.    HYPERTENSION Currently taking: see medication list.  He is on amlodipine, hydrochlorothiazide and metoprolol Med Adherence: [x]  Yes    []  No Medication side effects: []  Yes    [x]  No Adherence with salt restriction: [x]  Yes    []  No Home Monitoring?: [x]  Yes    []  No Monitoring Frequency:  Home BP results range: 130-133/80-83 SOB? []  Yes    [x]  No Chest Pain?: []  Yes    [x]  No Leg swelling?: []  Yes    [x]  No Headaches?: []  Yes    [x]  No Dizziness? []  Yes    [x]  No Comments:    HL:  taking tolerating Lipitor  OK knee:  reports increase pain in both knees over past 2 wks.  Goes to gym 3-4x/wk to walk on TM for 40-60 mins.  Pain in the left knee is usually in the posterior aspect laterally.  Good relief with tramadol.  No significant side effects from tramadol. Wgh up 7 lbs since last visit 02/2021 Visited his daughter in several wks ago and ate more. He has cut back on white carbs and sweets.  States that he will work on getting his weight back down. Patient Active Problem List   Diagnosis Date Noted   Abnormal CT of the head 05/30/2018   Osteoarthritis of left knee 09/14/2017   It band syndrome, right 12/30/2016   Knee mass, left 12/09/2016   Obesity (BMI 30-39.9) 08/06/2016   Gastroesophageal reflux disease 04/26/2016   Midline low back pain without sciatica 08/09/2014   Dyslipidemia 04/24/2013   HTN (hypertension) 12/15/2012     Current Outpatient Medications on File Prior to Visit  Medication Sig Dispense Refill   atorvastatin (LIPITOR) 10 MG tablet Take 1 tablet (10 mg total) by mouth daily. 30 tablet 2   cyclobenzaprine (FLEXERIL) 5 MG tablet Take 1-2 tablets (5-10 mg  total) by mouth 2 (two) times daily as needed for muscle spasms. 24 tablet 0   meloxicam (MOBIC) 15 MG tablet Take 1 tablet (15 mg total) by mouth daily. 60 tablet 1   traMADol (ULTRAM) 50 MG tablet Take 1 tablet (50 mg total) by mouth every 12 (twelve) hours as needed. 60 tablet 0   No current facility-administered medications on file prior to visit.    No Known Allergies  Social History   Socioeconomic History   Marital status: Married    Spouse name: Not on file   Number of children: Not on file   Years of education: Not on file   Highest education level: Not on file  Occupational History   Not on file  Tobacco Use   Smoking status: Never   Smokeless tobacco: Never  Vaping Use   Vaping Use: Never used  Substance and Sexual Activity   Alcohol use: No   Drug use: No   Sexual activity: Yes    Birth control/protection: Condom  Other Topics Concern   Not on file  Social History Narrative   Not on file   Social Determinants of Health   Financial Resource Strain: Not on file  Food Insecurity: Not on file  Transportation Needs: Not on file  Physical Activity:  Not on file  Stress: Not on file  Social Connections: Not on file  Intimate Partner Violence: Not on file    Family History  Problem Relation Age of Onset   Diabetes Mother    Hypertension Mother    Diabetes Father    Hypertension Father     Past Surgical History:  Procedure Laterality Date   COLONOSCOPY WITH PROPOFOL N/A 02/11/2015   Procedure: COLONOSCOPY WITH PROPOFOL;  Surgeon: Charolett Bumpers, MD;  Location: [facility];  Service: Endoscopy;  Laterality: N/A;    ROS: Review of Systems Negative except as stated above  PHYSICAL EXAM: BP 135/87    Pulse 80    Resp 16    Wt 257 lb 12.8 oz (116.9 kg)    SpO2 97%    BMI 35.96 kg/m   Wt Readings from Last 3 Encounters:  06/20/21 257 lb 12.8 oz (116.9 kg)  02/20/21 250 lb 14.1 oz (113.8 kg)  02/18/21 250 lb 12.8 oz (113.8 kg)    Physical  Exam   General appearance - alert, well appearing, and in no distress Mental status - normal mood, behavior, speech, dress, motor activity, and thought processes Neck - supple, no significant adenopathy Chest - clear to auscultation, no wheezes, rales or rhonchi, symmetric air entry Heart - normal rate, regular rhythm, normal S1, S2, no murmurs, rubs, clicks or gallops Musculoskeletal -knees: Mild enlargement of both knees.  He has no point tenderness.  Good range of motion. Extremities -no lower extremity edema.  CMP Latest Ref Rng & Units 02/20/2021 11/24/2020 12/15/2019  Glucose 70 - 99 mg/dL 643(P) 295(J) 80  BUN 6 - 20 mg/dL 15 14 17   Creatinine 0.61 - 1.24 mg/dL 8.84 1.66  Sodium 135 - 145 mmol/L 134(L) 139 140  Potassium 3.5 - 5.1 mmol/L 3.3(L) 3.6 4.5  Chloride 98 - 111 mmol/L 96(L) 100 100  CO2 22 - 32 mmol/L 26 29 26   Calcium 8.9 - 10.3 mg/dL 8.9 9.3 9.7  Total Protein 6.5 - 8.1 g/dL 7.7 - 7.5  Total Bilirubin 0.3 - 1.2 mg/dL 0.5 - 0.3  Alkaline Phos 38 - 126 U/L 50 - 55  AST 15 - 41 U/L 20 - 18  ALT 0 - 44 U/L 24 - 23   Lipid Panel     Component Value Date/Time   CHOL 158 12/15/2019 1531   TRIG 154 (H) 12/15/2019 1531   HDL 37 (L) 12/15/2019 1531   CHOLHDL 4.3 12/15/2019 1531   CHOLHDL 3.9 04/24/2016 1619   VLDL 33 (H) 04/24/2016 1619   LDLCALC 94 12/15/2019 1531    CBC    Component Value Date/Time   WBC 11.0 (H) 02/20/2021 1546   RBC 5.06 02/20/2021 1546   HGB 14.1 02/20/2021 1546   HGB 14.0 12/15/2019 1531   HCT 41.6 02/20/2021 1546   HCT 43.0 12/15/2019 1531   PLT 255 02/20/2021 1546   PLT 240 10/11/2018 1010   MCV 82.2 02/20/2021 1546   MCV 82 12/15/2019 1531   MCH 27.9 02/20/2021 1546   MCHC 33.9 02/20/2021 1546   RDW 13.9 02/20/2021 1546   RDW 13.9 12/15/2019 1531   LYMPHSABS 2.1 02/20/2021 1546   LYMPHSABS 2.4 12/15/2019 1531   MONOABS 1.0 02/20/2021 1546   EOSABS 0.4 02/20/2021 1546   EOSABS 0.4 12/15/2019 1531   BASOSABS 0.1 02/20/2021  1546   BASOSABS 0.1 12/15/2019 1531    ASSESSMENT AND PLAN: 1. Essential hypertension Close to goal.  Patient not interested in  adding any other medication.  He will continue to take his current medications and low-salt diet. - hydrochlorothiazide (HYDRODIURIL) 25 MG tablet; Take 1 tablet by mouth daily.  Dispense: 90 tablet; Refill: 0 - metoprolol succinate (TOPROL-XL) 100 MG 24 hr tablet; Take 1 tablet by mouth daily.  Dispense: 90 tablet; Refill: 0 - amLODipine (NORVASC) 10 MG tablet; Take 1 tablet by mouth daily.  Dispense: 90 tablet; Refill: 0  2. Dyslipidemia Continue atorvastatin.  3. Osteoarthritis of left knee, unspecified osteoarthritis type Discussed the importance of getting his weight down to take the mechanical strain off his knees.  He will continue to use tramadol as needed.  No aberrant behavior in use of tramadol.  4. Class 2 severe obesity with serious comorbidity and body mass index (BMI) of 35.0 to 35.9 in adult, unspecified obesity type [facility]) Discussed and encourage healthy eating habits.  Advised him to cut back on portion sizes, incorporate fresh fruits and vegetables into the diet daily, eat more lean white meat instead of red meat and eliminate sugary drinks from the diet.  He would like to see a nutritionist.  Encouraged him to continue regular exercise as he has been doing. - Amb ref to Medical Nutrition Therapy-[facility]  5. Influenza vaccination declined   6. Pneumococcal vaccination declined    Patient was given the opportunity to ask questions.  Patient verbalized understanding of the plan and was able to repeat key elements of the plan.   Orders Placed This Encounter  Procedures   Amb ref to Medical Nutrition Therapy[facility]     Requested Prescriptions   Signed Prescriptions Disp Refills   hydrochlorothiazide (HYDRODIURIL) 25 MG tablet 90 tablet 0    Sig: Take 1 tablet by mouth daily.   metoprolol succinate (TOPROL-XL) 100 MG 24 hr tablet 90 tablet 0     Sig: Take 1 tablet by mouth daily.   amLODipine (NORVASC) 10 MG tablet 90 tablet 0    Sig: Take 1 tablet by mouth daily.    Return in about 4 months (around 10/19/2021).  [name], MD, FACP

## 2021-07-01 ENCOUNTER — Other Ambulatory Visit: Payer: Self-pay

## 2021-07-03 ENCOUNTER — Other Ambulatory Visit: Payer: Self-pay

## 2021-07-07 NOTE — Progress Notes (Signed)
Cardiology Office Note:    Date:  07/10/2021   ID:  Damon Springsaji Knoch, DOB 07/16/1965, MRN [id number]  PCP:  Marcine MatarJohnson, Deborah B, MD  Cardiologist:  None  Electrophysiologist:  None   Referring MD: Marcine MatarJohnson, Deborah B, MD   Chief Complaint  Patient presents with   Chest Pain    History of Present Illness:    Damon Peck is a 56 y.o. male with a hx of hypertension, hyperlipidemia who is referred by Dr. Laural BenesJohnson for evaluation of chest pain.  He reports that he has been having chest pain.  Describes as sharp pain on left side of chest.  Improves when he cracks his chest.  Was having pain daily 2 months ago but is currently improved.  He rides bicycle and walks on treadmill 4-5 times per week for 1 hour, denies any chest pain or dyspnea with this.  Denies any lightheadedness, syncope, lower extremity edema, or palpitations.  BP usually 130s over 80s when checks at home, reports elevated recently due to low back pain.  No smoking history.  Brother died of MI at age 58.    Past Medical History:  Diagnosis Date   Hyperlipidemia 06/02/2018   Hypertension     Past Surgical History:  Procedure Laterality Date   COLONOSCOPY WITH PROPOFOL N/A 02/11/2015   Procedure: COLONOSCOPY WITH PROPOFOL;  Surgeon: Charolett BumpersMartin K Johnson, MD;  Location: [facility];  Service: Endoscopy;  Laterality: N/A;    Current Medications: Current Meds  Medication Sig   amLODipine (NORVASC) 10 MG tablet Take 1 tablet by mouth daily.   aspirin EC 81 MG tablet Take 81 mg by mouth daily. Swallow whole.   atorvastatin (LIPITOR) 10 MG tablet Take 1 tablet (10 mg total) by mouth daily.   hydrochlorothiazide (HYDRODIURIL) 25 MG tablet Take 1 tablet by mouth daily.   Ibuprofen (ADVIL LIQUI-GELS MINIS) 200 MG CAPS Take 200 mg by mouth as needed.   Magnesium 300 MG CAPS Take 1 capsule by mouth daily.   meloxicam (MOBIC) 15 MG tablet Take 1 tablet (15 mg total) by mouth daily.   metoprolol succinate (TOPROL-XL) 100 MG 24 hr tablet Take 1  tablet by mouth daily.   Multiple Vitamins-Minerals (CENTRUM SILVER 50+MEN PO) Take 1 tablet by mouth daily in the afternoon.   traMADol (ULTRAM) 50 MG tablet Take 1 tablet (50 mg total) by mouth every 12 (twelve) hours as needed.     Allergies:   Patient has no known allergies.   Social History   Socioeconomic History   Marital status: Married    Spouse name: Not on file   Number of children: Not on file   Years of education: Not on file   Highest education level: Not on file  Occupational History   Not on file  Tobacco Use   Smoking status: Never   Smokeless tobacco: Never  Vaping Use   Vaping Use: Never used  Substance and Sexual Activity   Alcohol use: No   Drug use: No   Sexual activity: Yes    Birth control/protection: Condom  Other Topics Concern   Not on file  Social History Narrative   Not on file   Social Determinants of Health   Financial Resource Strain: Not on file  Food Insecurity: Not on file  Transportation Needs: Not on file  Physical Activity: Not on file  Stress: Not on file  Social Connections: Not on file     Family History: The patient's family history includes Diabetes in his  father and mother; Hypertension in his father and mother.  ROS:   Please see the history of present illness.     All other systems reviewed and are negative.  EKGs/Labs/Other Studies Reviewed:    The following studies were reviewed today:   EKG:   07/10/20: Sinus rhythm, rate 66, first-degree AV block, no ST abnormalities  Recent Labs: 02/20/2021: ALT 24; BUN 15; Creatinine, Ser 0.65; Hemoglobin 14.1; Platelets 255; Potassium 3.3; Sodium 134  Recent Lipid Panel    Component Value Date/Time   CHOL 158 12/15/2019 1531   TRIG 154 (H) 12/15/2019 1531   HDL 37 (L) 12/15/2019 1531   CHOLHDL 4.3 12/15/2019 1531   CHOLHDL 3.9 04/24/2016 1619   VLDL 33 (H) 04/24/2016 1619   LDLCALC 94 12/15/2019 1531    Physical Exam:    VS:  BP (!) 152/86 (BP Location: Left  Arm, Patient Position: Sitting, Cuff Size: Large)    Pulse 66    Ht 5\' 9"  (1.753 m)    Wt 250 lb 12.8 oz (113.8 kg)    SpO2 97%    BMI 37.04 kg/m     Wt Readings from Last 3 Encounters:  07/10/21 250 lb 12.8 oz (113.8 kg)  07/08/21 246 lb (111.6 kg)  06/20/21 257 lb 12.8 oz (116.9 kg)     GEN:  Well nourished, well developed in no acute distress HEENT: Normal NECK: No JVD; No carotid bruits LYMPHATICS: No lymphadenopathy CARDIAC: RRR, no murmurs, rubs, gallops RESPIRATORY:  Clear to auscultation without rales, wheezing or rhonchi  ABDOMEN: Soft, non-tender, non-distended MUSCULOSKELETAL:  No edema; No deformity  SKIN: Warm and dry NEUROLOGIC:  Alert and oriented x 3 PSYCHIATRIC:  Normal affect   ASSESSMENT:    1. Precordial pain   2. Essential hypertension   3. Hyperlipidemia, unspecified hyperlipidemia type    PLAN:    Chest pain: Atypical in description but does have significant CAD risk factors (hypertension, hyperlipidemia, family history).  Recommend coronary CTA to rule out obstructive CAD.   Hypertension: On Toprol-XL 100 mg daily, amlodipine 10 mg daily, hydrochlorothiazide 25 mg daily.  Elevated in clinic today, reports has been well controlled at home, but elevated today due to low back pain  Hyperlipidemia: On atorvastatin 10 mg daily.  Will check lipid panel.  Will follow-up results of coronary CTA to guide how aggressive to be lowering cholesterol.  RTC in 6 months  Medication Adjustments/Labs and Tests Ordered: Current medicines are reviewed at length with the patient today.  Concerns regarding medicines are outlined above.  Orders Placed This Encounter  Procedures   CT CORONARY MORPH W/CTA COR W/SCORE W/CA W/CM &/OR WO/CM   Basic Metabolic Panel (BMET)   Lipid panel   EKG 12-Lead   No orders of the defined types were placed in this encounter.   Patient Instructions   Lab Work:  Your physician recommends that you HAVE LAB WORK TODAY  If you have  labs (blood work) drawn today and your tests are completely normal, you will receive your results only by: MyChart Message (if you have MyChart) OR A paper copy in the mail If you have any lab test that is abnormal or we need to change your treatment, we will call you to review the results.   Testing/Procedures:   Your cardiac CT will be scheduled at   [facility] [address] [place], [place] [phone]   If scheduled at [facility], please arrive at the [facility]  main entrance (entrance A) of [facility] 30 minutes prior to test start time. You can use the FREE valet parking offered at the main entrance (encouraged to control the heart rate for the test) Proceed to the [facility] Radiology Department (first floor) to check-in and test prep.    Please follow these instructions carefully (unless otherwise directed):  Hold all erectile dysfunction medications at least 3 days (72 hrs) prior to test.  On the Night Before the Test: Be sure to Drink plenty of water. Do not consume any caffeinated/decaffeinated beverages or chocolate 12 hours prior to your test. Do not take any antihistamines 12 hours prior to your test.   On the Day of the Test: Drink plenty of water until 1 hour prior to the test. Do not eat any food 4 hours prior to the test. You may take your regular medications prior to the test.  HOLD Furosemide/Hydrochlorothiazide morning of the test.       After the Test: Drink plenty of water. After receiving IV contrast, you may experience a mild flushed feeling. This is normal. On occasion, you may experience a mild rash up to 24 hours after the test. This is not dangerous. If this occurs, you can take Benadryl 25 mg and increase your fluid intake. If you experience trouble breathing, this can be serious. If it is severe call 911 IMMEDIATELY. If it is mild, please call our office. If you take any of these medications:  Glipizide/Metformin, Avandament, Glucavance, please do not take 48 hours after completing test unless otherwise instructed.  Please allow 2-4 weeks for scheduling of routine cardiac CTs. Some insurance companies require a pre-authorization which may delay scheduling of this test.   For non-scheduling related questions, please contact the cardiac imaging nurse navigator should you have any questions/concerns: Rockwell Alexandria, Cardiac Imaging Nurse Navigator Larey Brick, Cardiac Imaging Nurse Navigator [facility] Direct Office Dial: [phone]   For scheduling needs, including cancellations and rescheduling, please call [place], [phone].    Follow-Up: At [facility], you and your health needs are our priority.  As part of our continuing mission to provide you with exceptional heart care, we have created designated Provider Care Teams.  These Care Teams include your primary Cardiologist (physician) and Advanced Practice Providers (APPs -  Physician Assistants and Nurse Practitioners) who all work together to provide you with the care you need, when you need it.  We recommend signing up for the patient portal called "MyChart".  Sign up information is provided on this After Visit Summary.  MyChart is used to connect with patients for Virtual Visits (Telemedicine).  Patients are able to view lab/test results, encounter notes, upcoming appointments, etc.  Non-urgent messages can be sent to your provider as well.   To learn more about what you can do with MyChart, go to [email or web address].    Your next appointment:   6 month(s)  The format for your next appointment:   In Person  Provider:   Epifanio Lesches MD       Signed, Little Ishikawa, MD  07/10/2021 3:19 PM    [facility]

## 2021-07-08 ENCOUNTER — Encounter (HOSPITAL_BASED_OUTPATIENT_CLINIC_OR_DEPARTMENT_OTHER): Payer: Self-pay | Admitting: Emergency Medicine

## 2021-07-08 ENCOUNTER — Emergency Department (HOSPITAL_BASED_OUTPATIENT_CLINIC_OR_DEPARTMENT_OTHER)
Admission: EM | Admit: 2021-07-08 | Discharge: 2021-07-08 | Disposition: A | Discharge status: Home / Self Care | Payer: Self-pay | Attending: Emergency Medicine | Admitting: Emergency Medicine

## 2021-07-08 ENCOUNTER — Other Ambulatory Visit: Payer: Self-pay

## 2021-07-08 ENCOUNTER — Emergency Department (HOSPITAL_BASED_OUTPATIENT_CLINIC_OR_DEPARTMENT_OTHER): Payer: Self-pay

## 2021-07-08 ENCOUNTER — Ambulatory Visit: Payer: Self-pay | Admitting: *Deleted

## 2021-07-08 DIAGNOSIS — M545 Low back pain, unspecified: Secondary | ICD-10-CM | POA: Insufficient documentation

## 2021-07-08 DIAGNOSIS — Z5321 Procedure and treatment not carried out due to patient leaving prior to being seen by health care provider: Secondary | ICD-10-CM | POA: Insufficient documentation

## 2021-07-08 LAB — URINALYSIS, ROUTINE W REFLEX MICROSCOPIC
Bilirubin Urine: NEGATIVE
Glucose, UA: NEGATIVE mg/dL
Hgb urine dipstick: NEGATIVE
Ketones, ur: NEGATIVE mg/dL
Leukocytes,Ua: NEGATIVE
Nitrite: NEGATIVE
Protein, ur: 30 mg/dL — AB
Specific Gravity, Urine: 1.02 (ref 1.005–1.030)
pH: 6.5 (ref 5.0–8.0)

## 2021-07-08 LAB — URINALYSIS, MICROSCOPIC (REFLEX)

## 2021-07-08 NOTE — Telephone Encounter (Signed)
Patient is calling with back pain- severe rating. Per agent- patient is at ED. Patient disconnected call before transfer- since in ED will not call him back- patient would be advised to stay.

## 2021-07-08 NOTE — ED Triage Notes (Signed)
Pt c/o lower back pain x 2d w/ no injury

## 2021-07-10 ENCOUNTER — Encounter: Payer: Self-pay | Admitting: Cardiology

## 2021-07-10 ENCOUNTER — Other Ambulatory Visit: Payer: Self-pay

## 2021-07-10 ENCOUNTER — Ambulatory Visit (INDEPENDENT_AMBULATORY_CARE_PROVIDER_SITE_OTHER): Payer: Self-pay | Admitting: Cardiology

## 2021-07-10 VITALS — BP 152/86 | HR 66 | Ht 69.0 in | Wt 250.8 lb

## 2021-07-10 DIAGNOSIS — I10 Essential (primary) hypertension: Secondary | ICD-10-CM

## 2021-07-10 DIAGNOSIS — R072 Precordial pain: Secondary | ICD-10-CM

## 2021-07-10 DIAGNOSIS — E785 Hyperlipidemia, unspecified: Secondary | ICD-10-CM

## 2021-07-10 NOTE — Patient Instructions (Signed)
Lab Work:  Your physician recommends that you HAVE LAB WORK TODAY  If you have labs (blood work) drawn today and your tests are completely normal, you will receive your results only by: MyChart Message (if you have MyChart) OR A paper copy in the mail If you have any lab test that is abnormal or we need to change your treatment, we will call you to review the results.   Testing/Procedures:   Your cardiac CT will be scheduled at   [facility] [address] [place], [place] [address] [phone]   If scheduled at [facility], please arrive at the [facility] main entrance (entrance A) of [facility] 30 minutes prior to test start time. You can use the FREE valet parking offered at the main entrance (encouraged to control the heart rate for the test) Proceed to the [facility] Radiology Department (first floor) to check-in and test prep.    Please follow these instructions carefully (unless otherwise directed):  Hold all erectile dysfunction medications at least 3 days (72 hrs) prior to test.  On the Night Before the Test: Be sure to Drink plenty of water. Do not consume any caffeinated/decaffeinated beverages or chocolate 12 hours prior to your test. Do not take any antihistamines 12 hours prior to your test.   On the Day of the Test: Drink plenty of water until 1 hour prior to the test. Do not eat any food 4 hours prior to the test. You may take your regular medications prior to the test.  HOLD Furosemide/Hydrochlorothiazide morning of the test.       After the Test: Drink plenty of water. After receiving IV contrast, you may experience a mild flushed feeling. This is normal. On occasion, you may experience a mild rash up to 24 hours after the test. This is not dangerous. If this occurs, you can take Benadryl 25 mg and increase your fluid intake. If you experience trouble breathing, this can be serious. If it is severe call 911  IMMEDIATELY. If it is mild, please call our office. If you take any of these medications: Glipizide/Metformin, Avandament, Glucavance, please do not take 48 hours after completing test unless otherwise instructed.  Please allow 2-4 weeks for scheduling of routine cardiac CTs. Some insurance companies require a pre-authorization which may delay scheduling of this test.   For non-scheduling related questions, please contact the cardiac imaging nurse navigator should you have any questions/concerns: Rockwell Alexandria, Cardiac Imaging Nurse Navigator Larey Brick, Cardiac Imaging Nurse Navigator [facility] Direct Office Dial: [phone]   For scheduling needs, including cancellations and rescheduling, please call [place], [phone].    Follow-Up: At [facility], you and your health needs are our priority.  As part of our continuing mission to provide you with exceptional heart care, we have created designated Provider Care Teams.  These Care Teams include your primary Cardiologist (physician) and Advanced Practice Providers (APPs -  Physician Assistants and Nurse Practitioners) who all work together to provide you with the care you need, when you need it.  We recommend signing up for the patient portal called "MyChart".  Sign up information is provided on this After Visit Summary.  MyChart is used to connect with patients for Virtual Visits (Telemedicine).  Patients are able to view lab/test results, encounter notes, upcoming appointments, etc.  Non-urgent messages can be sent to your provider as well.   To learn more about what you can do with MyChart,  go to [email or web address].    Your next appointment:   6 month(s)  The format for your next appointment:   In Person  Provider:   Epifanio Lesches MD

## 2021-07-11 LAB — BASIC METABOLIC PANEL
BUN/Creatinine Ratio: 28 — ABNORMAL HIGH (ref 9–20)
BUN: 20 mg/dL (ref 6–24)
CO2: 28 mmol/L (ref 20–29)
Calcium: 9.9 mg/dL (ref 8.7–10.2)
Chloride: 96 mmol/L (ref 96–106)
Creatinine, Ser: 0.72 mg/dL — ABNORMAL LOW (ref 0.76–1.27)
Glucose: 87 mg/dL (ref 70–99)
Potassium: 4.4 mmol/L (ref 3.5–5.2)
Sodium: 140 mmol/L (ref 134–144)
eGFR: 107 mL/min/{1.73_m2} (ref >59)

## 2021-07-11 LAB — LIPID PANEL
Chol/HDL Ratio: 4.2 ratio (ref 0.0–5.0)
Cholesterol, Total: 150 mg/dL (ref 100–199)
HDL: 36 mg/dL — ABNORMAL LOW (ref >39)
LDL Chol Calc (NIH): 90 mg/dL (ref 0–99)
Triglycerides: 137 mg/dL (ref 0–149)
VLDL Cholesterol Cal: 24 mg/dL (ref 5–40)

## 2021-07-18 ENCOUNTER — Telehealth (HOSPITAL_COMMUNITY): Payer: Self-pay | Admitting: Emergency Medicine

## 2021-07-18 NOTE — Telephone Encounter (Signed)
Reaching out to patient to offer assistance regarding upcoming cardiac imaging study; pt verbalizes understanding of appt date/time, parking situation and where to check in, pre-test NPO status and medications ordered, and verified current allergies; name and call back number provided for further questions should they arise Rockwell Alexandria RN Navigator Cardiac Imaging [facility] [phone] office [phone] cell   Arrival 2:00P  Denies iv issues Takes 100mg  metoprolol succinate daily (HR 66) Holding HCTZ

## 2021-07-21 ENCOUNTER — Other Ambulatory Visit: Payer: Self-pay

## 2021-07-21 ENCOUNTER — Encounter (HOSPITAL_COMMUNITY): Payer: Self-pay

## 2021-07-21 ENCOUNTER — Ambulatory Visit (HOSPITAL_COMMUNITY)
Admission: RE | Admit: 2021-07-21 | Discharge: 2021-07-21 | Disposition: A | Discharge status: Home / Self Care | Payer: Self-pay | Source: Ambulatory Visit | Attending: Cardiology | Admitting: Cardiology

## 2021-07-21 DIAGNOSIS — R072 Precordial pain: Secondary | ICD-10-CM

## 2021-07-21 MED ORDER — METOPROLOL TARTRATE 5 MG/5ML IV SOLN: 10.0000 mg | INTRAVENOUS | Status: DC | PRN

## 2021-07-21 MED ORDER — NITROGLYCERIN 0.4 MG SL SUBL
SUBLINGUAL_TABLET | SUBLINGUAL | Status: AC
  Filled 2021-07-21: qty 2

## 2021-07-21 MED ORDER — IOHEXOL 350 MG/ML SOLN
95.0000 mL | Freq: Once | INTRAVENOUS | Status: AC | PRN
  Administered 2021-07-21: 95 mL via INTRAVENOUS

## 2021-07-21 MED ORDER — NITROGLYCERIN 0.4 MG SL SUBL
0.8000 mg | SUBLINGUAL_TABLET | Freq: Once | SUBLINGUAL | Status: AC
  Administered 2021-07-21: 0.8 mg via SUBLINGUAL

## 2021-07-21 MED ORDER — METOPROLOL TARTRATE 5 MG/5ML IV SOLN
INTRAVENOUS | Status: AC
  Administered 2021-07-21: 10 mg via INTRAVENOUS
  Filled 2021-07-21: qty 10

## 2021-07-22 ENCOUNTER — Other Ambulatory Visit: Payer: Self-pay

## 2021-07-22 ENCOUNTER — Telehealth: Payer: Self-pay | Admitting: *Deleted

## 2021-07-22 DIAGNOSIS — E785 Hyperlipidemia, unspecified: Secondary | ICD-10-CM

## 2021-07-22 DIAGNOSIS — R911 Solitary pulmonary nodule: Secondary | ICD-10-CM

## 2021-07-22 MED ORDER — ATORVASTATIN CALCIUM 20 MG PO TABS
20.0000 mg | ORAL_TABLET | Freq: Every day | ORAL | 3 refills | Status: DC
  Filled 2021-07-22: qty 90, 90d supply, fill #0
  Filled 2021-07-22: qty 30, 30d supply, fill #0
  Filled 2021-08-19: qty 30, 30d supply, fill #1
  Filled 2021-09-22: qty 30, 30d supply, fill #2
  Filled 2021-10-19: qty 30, 30d supply, fill #3
  Filled 2021-11-14: qty 30, 30d supply, fill #4
  Filled 2021-12-21: qty 30, 30d supply, fill #5
  Filled 2022-01-19: qty 30, 30d supply, fill #6
  Filled 2022-02-17: qty 30, 30d supply, fill #7

## 2021-07-22 NOTE — Telephone Encounter (Signed)
-----   Message from Little Ishikawa, MD sent at 07/22/2021  6:55 AM EST ----- Plaque in heart arteries but no significant blockage.  Recommend increasing atorvastatin to 20 mg daily  Also with small lung nodule, recommend noncontrast chest CT in 1 year

## 2021-07-22 NOTE — Telephone Encounter (Signed)
pt aware of results  New script sent to the pharmacy  Order placed for CT wo in one year.

## 2021-07-24 ENCOUNTER — Other Ambulatory Visit: Payer: Self-pay

## 2021-07-28 ENCOUNTER — Other Ambulatory Visit: Payer: Self-pay

## 2021-07-28 ENCOUNTER — Encounter: Payer: Self-pay | Admitting: Family Medicine

## 2021-07-28 ENCOUNTER — Ambulatory Visit: Payer: Self-pay | Attending: Family Medicine | Admitting: Family Medicine

## 2021-07-28 VITALS — BP 136/88 | HR 72 | Ht 69.0 in | Wt 248.6 lb

## 2021-07-28 DIAGNOSIS — M47896 Other spondylosis, lumbar region: Secondary | ICD-10-CM

## 2021-07-28 MED ORDER — CYCLOBENZAPRINE HCL 10 MG PO TABS
10.0000 mg | ORAL_TABLET | Freq: Two times a day (BID) | ORAL | 2 refills | Status: DC | PRN
  Filled 2021-07-28: qty 60, 30d supply, fill #0

## 2021-07-28 NOTE — Progress Notes (Signed)
Lower back pain 

## 2021-07-28 NOTE — Progress Notes (Signed)
Subjective:  Patient ID: Damon Peck, male    DOB: January 07, 1966  Age: 56 y.o. MRN: [id number]  CC: Back Pain   HPI Damon Peck is a 56 y.o. year old male patient of Dr. Wynetta Emery with a history of hypertension, dyslipidemia, osteoarthritis of the knee, presents today for an acute visit. He saw his PCP last month for chronic disease management. He had an ED visit for low back pain 2 weeks ago but left prior to evaluation being completed. Xray lumbar spine revealed: IMPRESSION: Multilevel degenerative disc disease as described above. No acute abnormality seen. Incidental note is made of probable small left renal calculus.   Interval History: He has Tramadol at home and uses it occasionally and Advil intermittently as he does not want to get addicted to tramadol and Advil messes with the kidneys. Pain occurs in the mid lumbar spine and sometimes on the sides as a 7/10 and worsens as he walks and eases when he lies down.  Pain does not radiate down lower extremities and he denies presence of numbness in his extremities, no recent falls, no loss of sphincteric function. He uses a back brace for support. Past Medical History:  Diagnosis Date   Hyperlipidemia 06/02/2018   Hypertension     Past Surgical History:  Procedure Laterality Date   COLONOSCOPY WITH PROPOFOL N/A 02/11/2015   Procedure: COLONOSCOPY WITH PROPOFOL;  Surgeon: Garlan Fair, MD;  Location: [facility];  Service: Endoscopy;  Laterality: N/A;    Family History  Problem Relation Age of Onset   Diabetes Mother    Hypertension Mother    Diabetes Father    Hypertension Father     No Known Allergies  Outpatient Medications Prior to Visit  Medication Sig Dispense Refill   amLODipine (NORVASC) 10 MG tablet Take 1 tablet by mouth daily. 90 tablet 0   aspirin EC 81 MG tablet Take 81 mg by mouth daily. Swallow whole.     atorvastatin (LIPITOR) 20 MG tablet Take 1 tablet (20 mg total) by mouth daily. 90 tablet 3    hydrochlorothiazide (HYDRODIURIL) 25 MG tablet Take 1 tablet by mouth daily. 90 tablet 0   Ibuprofen 200 MG CAPS Take 200 mg by mouth as needed.     Magnesium 300 MG CAPS Take 1 capsule by mouth daily.     meloxicam (MOBIC) 15 MG tablet Take 1 tablet (15 mg total) by mouth daily. 60 tablet 1   metoprolol succinate (TOPROL-XL) 100 MG 24 hr tablet Take 1 tablet by mouth daily. 90 tablet 0   Multiple Vitamins-Minerals (CENTRUM SILVER 50+MEN PO) Take 1 tablet by mouth daily in the afternoon.     traMADol (ULTRAM) 50 MG tablet Take 1 tablet (50 mg total) by mouth every 12 (twelve) hours as needed. 60 tablet 0   cyclobenzaprine (FLEXERIL) 5 MG tablet Take 1-2 tablets (5-10 mg total) by mouth 2 (two) times daily as needed for muscle spasms. (Patient not taking: Reported on 07/10/2021) 24 tablet 0   No facility-administered medications prior to visit.     ROS Review of Systems  Constitutional:  Negative for activity change and appetite change.  HENT:  Negative for sinus pressure and sore throat.   Eyes:  Negative for visual disturbance.  Respiratory:  Negative for cough, chest tightness and shortness of breath.   Cardiovascular:  Negative for chest pain and leg swelling.  Gastrointestinal:  Negative for abdominal distention, abdominal pain, constipation and diarrhea.  Endocrine: Negative.   Genitourinary:  Negative  for dysuria.  Musculoskeletal:  Positive for back pain. Negative for joint swelling and myalgias.  Skin:  Negative for rash.  Allergic/Immunologic: Negative.   Neurological:  Negative for weakness, light-headedness and numbness.  Psychiatric/Behavioral:  Negative for dysphoric mood and suicidal ideas.    Objective:  BP 136/88    Pulse 72    Ht 5\' 9"  (1.753 m)    Wt 248 lb 9.6 oz (112.8 kg)    SpO2 99%    BMI 36.71 kg/m   BP/Weight 07/28/2021 [id number]  Systolic BP [id number]  Diastolic BP 88 69 86  Wt. (Lbs) 248.6 - 250.8  BMI 36.71 - 37.04      Physical  Exam Constitutional:      Appearance: He is well-developed.  Cardiovascular:     Rate and Rhythm: Normal rate.     Heart sounds: Normal heart sounds. No murmur heard. Pulmonary:     Effort: Pulmonary effort is normal.     Breath sounds: Normal breath sounds. No wheezing or rales.  Chest:     Chest wall: No tenderness.  Abdominal:     General: Bowel sounds are normal. There is no distension.     Palpations: Abdomen is soft. There is no mass.     Tenderness: There is no abdominal tenderness.  Musculoskeletal:        General: Normal range of motion.     Right lower leg: No edema.     Left lower leg: No edema.     Comments: Positive straight leg raise on the left Mild tenderness to palpation of midpoint of lumbar spine, no paraspinal lumbar tenderness  Neurological:     Mental Status: He is alert and oriented to person, place, and time.  Psychiatric:        Mood and Affect: Mood normal.    CMP Latest Ref Rng & Units 07/10/2021 02/20/2021 11/24/2020  Glucose 70 - 99 mg/dL 87 134(H) 125(H)  BUN 6 - 24 mg/dL 20 15 14   Creatinine 0.76 - 1.27 mg/dL 0.72(L) 0.65 0.67  Sodium 134 - 144 mmol/L 140 134(L) 139  Potassium 3.5 - 5.2 mmol/L 4.4 3.3(L) 3.6  Chloride 96 - 106 mmol/L 96 96(L) 100  CO2 20 - 29 mmol/L 28 26 29   Calcium 8.7 - 10.2 mg/dL 9.9 8.9 9.3  Total Protein 6.5 - 8.1 g/dL - 7.7 -  Total Bilirubin 0.3 - 1.2 mg/dL - 0.5 -  Alkaline Phos 38 - 126 U/L - 50 -  AST 15 - 41 U/L - 20 -  ALT 0 - 44 U/L - 24 -    Lipid Panel     Component Value Date/Time   CHOL 150 07/10/2021 1448   TRIG 137 07/10/2021 1448   HDL 36 (L) 07/10/2021 1448   CHOLHDL 4.2 07/10/2021 1448   CHOLHDL 3.9 04/24/2016 1619   VLDL 33 (H) 04/24/2016 1619   LDLCALC 90 07/10/2021 1448    CBC    Component Value Date/Time   WBC 11.0 (H) 02/20/2021 1546   RBC 5.06 02/20/2021 1546   HGB 14.1 02/20/2021 1546   HGB 14.0 12/15/2019 1531   HCT 41.6 02/20/2021 1546   HCT 43.0 12/15/2019 1531   PLT 255  02/20/2021 1546   PLT 240 10/11/2018 1010   MCV 82.2 02/20/2021 1546   MCV 82 12/15/2019 1531   MCH 27.9 02/20/2021 1546   MCHC 33.9 02/20/2021 1546   RDW 13.9 02/20/2021 1546   RDW 13.9 12/15/2019 1531  LYMPHSABS 2.1 02/20/2021 1546   LYMPHSABS 2.4 12/15/2019 1531   MONOABS 1.0 02/20/2021 1546   EOSABS 0.4 02/20/2021 1546   EOSABS 0.4 12/15/2019 1531   BASOSABS 0.1 02/20/2021 1546   BASOSABS 0.1 12/15/2019 1531    Lab Results  Component Value Date   HGBA1C 5.50 05/16/2015    Assessment & Plan:  1. Other osteoarthritis of spine, lumbar region Uncontrolled He does have tramadol and Advil at home Will add on Flexeril Advised to apply heat or ice whichever is tolerated to painful areas. Counseled on evidence of improvement in pain control with regards to yoga, water aerobics, massage, home physical therapy, exercise as tolerated. - cyclobenzaprine (FLEXERIL) 10 MG tablet; Take 1 tablet (10 mg total) by mouth 2 (two) times daily as needed for muscle spasms.  Dispense: 60 tablet; Refill: 2 - Ambulatory referral to Physical Therapy    Meds ordered this encounter  Medications   cyclobenzaprine (FLEXERIL) 10 MG tablet    Sig: Take 1 tablet (10 mg total) by mouth 2 (two) times daily as needed for muscle spasms.    Dispense:  60 tablet    Refill:  2    Follow-up: Return for Medical conditions with PCP.       Charlott Rakes, MD, FAAFP. [facility] [place], [place]   07/28/2021, 3:01 PM

## 2021-07-28 NOTE — Patient Instructions (Signed)

## 2021-08-04 ENCOUNTER — Other Ambulatory Visit: Payer: Self-pay

## 2021-08-05 ENCOUNTER — Other Ambulatory Visit: Payer: Self-pay

## 2021-08-11 ENCOUNTER — Ambulatory Visit: Payer: Self-pay | Admitting: Dietician

## 2021-08-13 NOTE — Therapy (Incomplete)
OUTPATIENT PHYSICAL THERAPY THORACOLUMBAR EVALUATION   Patient Name: Damon Peck MRN: [id number] DOB:1965-07-19, 56 y.o., male Today's Date: 08/13/2021    Past Medical History:  Diagnosis Date   Hyperlipidemia 06/02/2018   Hypertension    Past Surgical History:  Procedure Laterality Date   COLONOSCOPY WITH PROPOFOL N/A 02/11/2015   Procedure: COLONOSCOPY WITH PROPOFOL;  Surgeon: Garlan Fair, MD;  Location: [facility];  Service: Endoscopy;  Laterality: N/A;   Patient Active Problem List   Diagnosis Date Noted   Abnormal CT of the head 05/30/2018   Osteoarthritis of left knee 09/14/2017   It band syndrome, right 12/30/2016   Knee mass, left 12/09/2016   Obesity (BMI 30-39.9) 08/06/2016   Gastroesophageal reflux disease 04/26/2016   Midline low back pain without sciatica 08/09/2014   Dyslipidemia 04/24/2013   HTN (hypertension) 12/15/2012    PCP: Ladell Pier, MD  REFERRING PROVIDER: Charlott Rakes, MD  REFERRING DIAG: [phone] (ICD-10-CM) - Other osteoarthritis of spine, lumbar region  THERAPY DIAG:  No diagnosis found.  ONSET DATE: ***  SUBJECTIVE:                                                                                                                                                                                           SUBJECTIVE STATEMENT: *** PERTINENT HISTORY:  ***  PAIN:  Are you having pain? {yes/no:20286} NPRS scale: ***/10 Pain location: *** Pain orientation: {Pain Orientation:25161}  PAIN TYPE: {type:313116} Pain description: {PAIN DESCRIPTION:21022940}  Aggravating factors: *** Relieving factors: ***  PRECAUTIONS: {Therapy precautions:24002}  WEIGHT BEARING RESTRICTIONS {Yes ***/No:24003}  FALLS:  Has patient fallen in last 6 months? {yes/no:20286}, Number of falls: ***  LIVING ENVIRONMENT: Lives with: {[facility] lives with:25569::"lives with their family"} Lives in: {Lives in:25570} Stairs: {yes/no:20286};  {Stairs:24000} Has following equipment at home: {Assistive devices:23999}  OCCUPATION: ***  PLOF: {PLOF:24004}  PATIENT GOALS ***   OBJECTIVE:   DIAGNOSTIC FINDINGS:  ***  PATIENT SURVEYS:  {rehab surveys:24030}  SCREENING FOR RED FLAGS: Bowel or bladder incontinence: {Yes/No:304960894} Spinal tumors: {Yes/No:304960894} Cauda equina syndrome: {Yes/No:304960894} Compression fracture: {Yes/No:304960894} Abdominal aneurysm: {Yes/No:304960894}  COGNITION:  Overall cognitive status: {cognition:24006}     SENSATION:  Light touch: {intact/deficits:24005}  Stereognosis: {intact/deficits:24005}  Hot/Cold: {intact/deficits:24005}  Proprioception: {intact/deficits:24005}  MUSCLE LENGTH: Hamstrings: Right *** deg; Left *** deg Thomas test: Right *** deg; Left *** deg  POSTURE:  ***  PALPATION: ***  LUMBARAROM/PROM  A/PROM A/PROM  08/13/2021  Flexion   Extension   Right lateral flexion   Left lateral flexion   Right rotation   Left rotation    (Blank rows = not tested)  LE AROM/PROM:  A/PROM Right 08/13/2021 Left 08/13/2021  Hip flexion    Hip extension    Hip abduction    Hip adduction    Hip internal rotation    Hip external rotation    Knee flexion    Knee extension    Ankle dorsiflexion    Ankle plantarflexion    Ankle inversion    Ankle eversion     (Blank rows = not tested)  LE MMT:  MMT Right 08/13/2021 Left 08/13/2021  Hip flexion    Hip extension    Hip abduction    Hip adduction    Hip internal rotation    Hip external rotation    Knee flexion    Knee extension    Ankle dorsiflexion    Ankle plantarflexion    Ankle inversion    Ankle eversion     (Blank rows = not tested)  LUMBAR SPECIAL TESTS:  {lumbar special test:25242}  FUNCTIONAL TESTS:  {Functional tests:24029}  GAIT: Distance walked: *** Assistive device utilized: {Assistive devices:23999} Level of assistance: {Levels of assistance:24026} Comments: ***    TODAY'S  TREATMENT  ***   PATIENT EDUCATION:  Education details: *** Person educated: {Person educated:25204} Education method: {Education Method:25205} Education comprehension: {Education Comprehension:25206}   HOME EXERCISE PROGRAM: ***  ASSESSMENT:  CLINICAL IMPRESSION: Patient is a *** y.o. *** who was seen today for physical therapy evaluation and treatment for ***. Objective impairments include {opptimpairments:25111}. These impairments are limiting patient from {activity limitations:25113}. Personal factors including {Personal factors:25162} are also affecting patient's functional outcome. Patient will benefit from skilled PT to address above impairments and improve overall function.  REHAB POTENTIAL: {rehabpotential:25112}  CLINICAL DECISION MAKING: {clinical decision making:25114}  EVALUATION COMPLEXITY: {Evaluation complexity:25115}   GOALS: Goals reviewed with patient? {yes/no:20286}  SHORT TERM GOALS:  STG Name Target Date Goal status  1 *** Baseline:  {follow up:25551} {GOALSTATUS:25110}  2 *** Baseline:  {follow up:25551} {GOALSTATUS:25110}  3 *** Baseline: {follow up:25551} {GOALSTATUS:25110}  4 *** Baseline: {follow up:25551} {GOALSTATUS:25110}  5 *** Baseline: {follow up:25551} {GOALSTATUS:25110}  6 *** Baseline: {follow up:25551} {GOALSTATUS:25110}  7 *** Baseline: {follow up:25551} {GOALSTATUS:25110}   LONG TERM GOALS:   LTG Name Target Date Goal status  1 *** Baseline: {follow up:25551} {GOALSTATUS:25110}  2 *** Baseline: {follow up:25551} {GOALSTATUS:25110}  3 *** Baseline: {follow up:25551} {GOALSTATUS:25110}  4 *** Baseline: {follow up:25551} {GOALSTATUS:25110}  5 *** Baseline: {follow up:25551} {GOALSTATUS:25110}  6 *** Baseline: {follow up:25551} {GOALSTATUS:25110}  7 *** Baseline: {follow up:25551} {GOALSTATUS:25110}   PLAN: PT FREQUENCY: {rehab frequency:25116}  PT DURATION: {rehab duration:25117}  PLANNED INTERVENTIONS: {rehab  planned interventions:25118::"Therapeutic exercises","Therapeutic activity","Neuro Muscular re-education","Balance training","Gait training","Patient/Family education","Joint mobilization"}  PLAN FOR NEXT SESSION: ***  ***

## 2021-08-14 ENCOUNTER — Ambulatory Visit: Payer: Self-pay | Admitting: Physical Therapy

## 2021-08-19 ENCOUNTER — Other Ambulatory Visit: Payer: Self-pay

## 2021-08-19 NOTE — Therapy (Incomplete)
OUTPATIENT PHYSICAL THERAPY THORACOLUMBAR EVALUATION   Patient Name: Damon Peck MRN: [id number] DOB:December 12, 1965, 56 y.o., male Today's Date: 08/19/2021    Past Medical History:  Diagnosis Date   Hyperlipidemia 06/02/2018   Hypertension    Past Surgical History:  Procedure Laterality Date   COLONOSCOPY WITH PROPOFOL N/A 02/11/2015   Procedure: COLONOSCOPY WITH PROPOFOL;  Surgeon: Charolett Bumpers, MD;  Location: [facility];  Service: Endoscopy;  Laterality: N/A;   Patient Active Problem List   Diagnosis Date Noted   Abnormal CT of the head 05/30/2018   Osteoarthritis of left knee 09/14/2017   It band syndrome, right 12/30/2016   Knee mass, left 12/09/2016   Obesity (BMI 30-39.9) 08/06/2016   Gastroesophageal reflux disease 04/26/2016   Midline low back pain without sciatica 08/09/2014   Dyslipidemia 04/24/2013   HTN (hypertension) 12/15/2012    PCP: Marcine Matar, MD  REFERRING PROVIDER: Hoy Register, MD  REFERRING DIAG: Other osteoarthritis of spine, lumbar region  THERAPY DIAG:  No diagnosis found.  ONSET DATE: ***  SUBJECTIVE:                                                                                                                                                                                           SUBJECTIVE STATEMENT: *** PERTINENT HISTORY:  ***  PAIN:  Are you having pain? {yes/no:20286} NPRS scale: ***/10 Pain location: *** Pain orientation: {Pain Orientation:25161}  PAIN TYPE: {type:313116} Pain description: {PAIN DESCRIPTION:21022940}  Aggravating factors: *** Relieving factors: ***  PRECAUTIONS: {Therapy precautions:24002}  WEIGHT BEARING RESTRICTIONS {Yes ***/No:24003}  FALLS:  Has patient fallen in last 6 months? {yes/no:20286}, Number of falls: ***  LIVING ENVIRONMENT: Lives with: {[facility] lives with:25569::"lives with their family"} Lives in: {Lives in:25570} Stairs: {yes/no:20286}; {Stairs:24000} Has following equipment  at home: {Assistive devices:23999}  OCCUPATION: ***  PLOF: {PLOF:24004}  PATIENT GOALS ***   OBJECTIVE:   DIAGNOSTIC FINDINGS:   Lumbar Xray- 07/08/21 IMPRESSION: Multilevel degenerative disc disease as described above. No acute abnormality seen. Incidental note is made of probable small left renal calculus.     Electronically Signed   By: Lupita Raider M.D.   On: 07/08/2021 13:56    PATIENT SURVEYS:  {rehab surveys:24030}  SCREENING FOR RED FLAGS: Bowel or bladder incontinence: {Yes/No:304960894} Spinal tumors: {Yes/No:304960894} Cauda equina syndrome: {Yes/No:304960894} Compression fracture: {Yes/No:304960894} Abdominal aneurysm: {Yes/No:304960894}  COGNITION:  Overall cognitive status: {cognition:24006}     SENSATION:  Light touch: {intact/deficits:24005}  Stereognosis: {intact/deficits:24005}  Hot/Cold: {intact/deficits:24005}  Proprioception: {intact/deficits:24005}  MUSCLE LENGTH: Hamstrings: Right *** deg; Left *** deg Thomas test: Right *** deg; Left *** deg  POSTURE:  ***  PALPATION: ***  LUMBARAROM/PROM  A/PROM A/PROM  08/19/2021  Flexion   Extension   Right lateral flexion   Left lateral flexion   Right rotation   Left rotation    (Blank rows = not tested)  LE AROM/PROM:  A/PROM Right 08/19/2021 Left 08/19/2021  Hip flexion    Hip extension    Hip abduction    Hip adduction    Hip internal rotation    Hip external rotation    Knee flexion    Knee extension    Ankle dorsiflexion    Ankle plantarflexion    Ankle inversion    Ankle eversion     (Blank rows = not tested)  LE MMT:  MMT Right 08/19/2021 Left 08/19/2021  Hip flexion    Hip extension    Hip abduction    Hip adduction    Hip internal rotation    Hip external rotation    Knee flexion    Knee extension    Ankle dorsiflexion    Ankle plantarflexion    Ankle inversion    Ankle eversion     (Blank rows = not tested)  LUMBAR SPECIAL TESTS:  {lumbar  special test:25242}  FUNCTIONAL TESTS:  {Functional tests:24029}  GAIT: Distance walked: *** Assistive device utilized: {Assistive devices:23999} Level of assistance: {Levels of assistance:24026} Comments: ***    TODAY'S TREATMENT  ***   PATIENT EDUCATION:  Education details: *** Person educated: {Person educated:25204} Education method: {Education Method:25205} Education comprehension: {Education Comprehension:25206}   HOME EXERCISE PROGRAM: ***  ASSESSMENT:  CLINICAL IMPRESSION: Patient is a *** y.o. *** who was seen today for physical therapy evaluation and treatment for ***. Objective impairments include {opptimpairments:25111}. These impairments are limiting patient from {activity limitations:25113}. Personal factors including {Personal factors:25162} are also affecting patient's functional outcome. Patient will benefit from skilled PT to address above impairments and improve overall function.  REHAB POTENTIAL: {rehabpotential:25112}  CLINICAL DECISION MAKING: {clinical decision making:25114}  EVALUATION COMPLEXITY: {Evaluation complexity:25115}   GOALS: Goals reviewed with patient? {yes/no:20286}  SHORT TERM GOALS:  STG Name Target Date Goal status  1 *** Baseline:  {follow up:25551} {GOALSTATUS:25110}  2 *** Baseline:  {follow up:25551} {GOALSTATUS:25110}  3 *** Baseline: {follow up:25551} {GOALSTATUS:25110}  4 *** Baseline: {follow up:25551} {GOALSTATUS:25110}  5 *** Baseline: {follow up:25551} {GOALSTATUS:25110}  6 *** Baseline: {follow up:25551} {GOALSTATUS:25110}  7 *** Baseline: {follow up:25551} {GOALSTATUS:25110}   LONG TERM GOALS:   LTG Name Target Date Goal status  1 *** Baseline: {follow up:25551} {GOALSTATUS:25110}  2 *** Baseline: {follow up:25551} {GOALSTATUS:25110}  3 *** Baseline: {follow up:25551} {GOALSTATUS:25110}  4 *** Baseline: {follow up:25551} {GOALSTATUS:25110}  5 *** Baseline: {follow up:25551} {GOALSTATUS:25110}   6 *** Baseline: {follow up:25551} {GOALSTATUS:25110}  7 *** Baseline: {follow up:25551} {GOALSTATUS:25110}   PLAN: PT FREQUENCY: {rehab frequency:25116}  PT DURATION: {rehab duration:25117}  PLANNED INTERVENTIONS: {rehab planned interventions:25118::"Therapeutic exercises","Therapeutic activity","Neuro Muscular re-education","Balance training","Gait training","Patient/Family education","Joint mobilization"}  PLAN FOR NEXT SESSION: ***   Nissim Fleischer, PT 08/19/2021, 8:53 PM

## 2021-08-20 ENCOUNTER — Other Ambulatory Visit: Payer: Self-pay

## 2021-08-20 ENCOUNTER — Ambulatory Visit: Payer: Self-pay | Attending: Family Medicine

## 2021-08-22 ENCOUNTER — Other Ambulatory Visit: Payer: Self-pay

## 2021-09-22 ENCOUNTER — Other Ambulatory Visit: Payer: Self-pay

## 2021-09-30 ENCOUNTER — Other Ambulatory Visit: Payer: Self-pay

## 2021-09-30 ENCOUNTER — Other Ambulatory Visit: Payer: Self-pay | Admitting: Internal Medicine

## 2021-09-30 DIAGNOSIS — I10 Essential (primary) hypertension: Secondary | ICD-10-CM

## 2021-10-01 ENCOUNTER — Other Ambulatory Visit: Payer: Self-pay

## 2021-10-01 MED ORDER — METOPROLOL SUCCINATE ER 100 MG PO TB24
ORAL_TABLET | Freq: Every day | ORAL | 2 refills | Status: DC
  Filled 2021-10-01: qty 30, 30d supply, fill #0
  Filled 2021-10-25: qty 30, 30d supply, fill #1
  Filled 2021-11-25: qty 30, 30d supply, fill #2

## 2021-10-01 MED ORDER — HYDROCHLOROTHIAZIDE 25 MG PO TABS
ORAL_TABLET | Freq: Every day | ORAL | 2 refills | Status: DC
  Filled 2021-10-01: qty 30, 30d supply, fill #0
  Filled 2021-10-25: qty 30, 30d supply, fill #1
  Filled 2021-11-25: qty 30, 30d supply, fill #2

## 2021-10-01 MED ORDER — AMLODIPINE BESYLATE 10 MG PO TABS
ORAL_TABLET | Freq: Every day | ORAL | 2 refills | Status: DC
  Filled 2021-10-01: qty 30, 30d supply, fill #0
  Filled 2021-10-25: qty 30, 30d supply, fill #1
  Filled 2021-11-25: qty 30, 30d supply, fill #2

## 2021-10-02 ENCOUNTER — Other Ambulatory Visit: Payer: Self-pay

## 2021-10-02 ENCOUNTER — Other Ambulatory Visit: Payer: Self-pay | Admitting: Internal Medicine

## 2021-10-02 DIAGNOSIS — M1712 Unilateral primary osteoarthritis, left knee: Secondary | ICD-10-CM

## 2021-10-02 MED ORDER — TRAMADOL HCL 50 MG PO TABS
50.0000 mg | ORAL_TABLET | Freq: Two times a day (BID) | ORAL | 0 refills | Status: DC | PRN
  Filled 2021-10-02: qty 60, 30d supply, fill #0

## 2021-10-03 ENCOUNTER — Other Ambulatory Visit: Payer: Self-pay

## 2021-10-20 ENCOUNTER — Other Ambulatory Visit: Payer: Self-pay

## 2021-10-27 ENCOUNTER — Other Ambulatory Visit: Payer: Self-pay

## 2021-11-14 ENCOUNTER — Other Ambulatory Visit: Payer: Self-pay

## 2021-11-25 ENCOUNTER — Other Ambulatory Visit: Payer: Self-pay | Admitting: Internal Medicine

## 2021-11-25 ENCOUNTER — Other Ambulatory Visit: Payer: Self-pay

## 2021-11-25 DIAGNOSIS — M1712 Unilateral primary osteoarthritis, left knee: Secondary | ICD-10-CM

## 2021-11-25 MED ORDER — TRAMADOL HCL 50 MG PO TABS
50.0000 mg | ORAL_TABLET | Freq: Two times a day (BID) | ORAL | 0 refills | Status: DC | PRN
  Filled 2021-11-25: qty 60, 30d supply, fill #0

## 2021-11-25 NOTE — Telephone Encounter (Signed)
Will forward to provider  

## 2021-12-21 ENCOUNTER — Other Ambulatory Visit: Payer: Self-pay | Admitting: Family Medicine

## 2021-12-21 DIAGNOSIS — I10 Essential (primary) hypertension: Secondary | ICD-10-CM

## 2021-12-22 ENCOUNTER — Other Ambulatory Visit: Payer: Self-pay

## 2021-12-22 MED ORDER — AMLODIPINE BESYLATE 10 MG PO TABS
ORAL_TABLET | Freq: Every day | ORAL | 2 refills | Status: DC
  Filled 2021-12-22: qty 30, 30d supply, fill #0
  Filled 2022-01-19: qty 30, 30d supply, fill #1
  Filled 2022-02-17: qty 30, 30d supply, fill #2

## 2021-12-22 MED ORDER — HYDROCHLOROTHIAZIDE 25 MG PO TABS
ORAL_TABLET | Freq: Every day | ORAL | 2 refills | Status: DC
  Filled 2021-12-22: qty 30, 30d supply, fill #0
  Filled 2022-01-19: qty 30, 30d supply, fill #1
  Filled 2022-02-17: qty 30, 30d supply, fill #2

## 2021-12-22 MED ORDER — METOPROLOL SUCCINATE ER 100 MG PO TB24
ORAL_TABLET | Freq: Every day | ORAL | 2 refills | Status: DC
  Filled 2021-12-22: qty 30, 30d supply, fill #0
  Filled 2022-01-19: qty 30, 30d supply, fill #1
  Filled 2022-02-17: qty 30, 30d supply, fill #2

## 2021-12-23 ENCOUNTER — Other Ambulatory Visit: Payer: Self-pay

## 2021-12-29 ENCOUNTER — Other Ambulatory Visit: Payer: Self-pay

## 2022-01-19 ENCOUNTER — Other Ambulatory Visit: Payer: Self-pay

## 2022-01-20 ENCOUNTER — Other Ambulatory Visit: Payer: Self-pay | Admitting: Internal Medicine

## 2022-01-20 ENCOUNTER — Other Ambulatory Visit: Payer: Self-pay

## 2022-01-20 DIAGNOSIS — M1712 Unilateral primary osteoarthritis, left knee: Secondary | ICD-10-CM

## 2022-01-21 ENCOUNTER — Other Ambulatory Visit: Payer: Self-pay

## 2022-01-21 MED ORDER — TRAMADOL HCL 50 MG PO TABS
50.0000 mg | ORAL_TABLET | Freq: Two times a day (BID) | ORAL | 0 refills | Status: DC | PRN
  Filled 2022-01-21: qty 60, 30d supply, fill #0

## 2022-02-17 ENCOUNTER — Other Ambulatory Visit: Payer: Self-pay

## 2022-02-20 ENCOUNTER — Other Ambulatory Visit: Payer: Self-pay

## 2022-03-10 ENCOUNTER — Other Ambulatory Visit: Payer: Self-pay | Admitting: Internal Medicine

## 2022-03-10 DIAGNOSIS — M1712 Unilateral primary osteoarthritis, left knee: Secondary | ICD-10-CM

## 2022-03-11 ENCOUNTER — Other Ambulatory Visit: Payer: Self-pay

## 2022-03-11 MED ORDER — TRAMADOL HCL 50 MG PO TABS
50.0000 mg | ORAL_TABLET | Freq: Two times a day (BID) | ORAL | 0 refills | Status: DC | PRN
  Filled 2022-03-11: qty 60, 30d supply, fill #0

## 2022-03-17 ENCOUNTER — Ambulatory Visit: Payer: Self-pay | Attending: Internal Medicine | Admitting: Internal Medicine

## 2022-03-17 ENCOUNTER — Encounter: Payer: Self-pay | Admitting: Internal Medicine

## 2022-03-17 ENCOUNTER — Other Ambulatory Visit: Payer: Self-pay

## 2022-03-17 VITALS — BP 134/76 | HR 63 | Temp 98.4°F | Ht 69.0 in | Wt 230.6 lb

## 2022-03-17 DIAGNOSIS — M1712 Unilateral primary osteoarthritis, left knee: Secondary | ICD-10-CM

## 2022-03-17 DIAGNOSIS — E785 Hyperlipidemia, unspecified: Secondary | ICD-10-CM

## 2022-03-17 DIAGNOSIS — Z2821 Immunization not carried out because of patient refusal: Secondary | ICD-10-CM

## 2022-03-17 DIAGNOSIS — E669 Obesity, unspecified: Secondary | ICD-10-CM

## 2022-03-17 DIAGNOSIS — I10 Essential (primary) hypertension: Secondary | ICD-10-CM

## 2022-03-17 DIAGNOSIS — E66811 Obesity, class 1: Secondary | ICD-10-CM

## 2022-03-17 MED ORDER — ATORVASTATIN CALCIUM 20 MG PO TABS
20.0000 mg | ORAL_TABLET | Freq: Every day | ORAL | 3 refills | Status: DC
  Filled 2022-03-17: qty 90, 90d supply, fill #0
  Filled 2022-06-19: qty 90, 90d supply, fill #1
  Filled 2022-09-01 (×2): qty 90, 90d supply, fill #2
  Filled 2022-12-14 – 2022-12-15 (×2): qty 90, 90d supply, fill #3

## 2022-03-17 MED ORDER — HYDROCHLOROTHIAZIDE 25 MG PO TABS
ORAL_TABLET | Freq: Every day | ORAL | 1 refills | Status: DC
  Filled 2022-03-17: qty 90, 90d supply, fill #0
  Filled 2022-06-19: qty 90, 90d supply, fill #1

## 2022-03-17 MED ORDER — AMLODIPINE BESYLATE 10 MG PO TABS
ORAL_TABLET | Freq: Every day | ORAL | 1 refills | Status: DC
  Filled 2022-03-17: qty 90, 90d supply, fill #0
  Filled 2022-06-19: qty 90, 90d supply, fill #1

## 2022-03-17 NOTE — Progress Notes (Signed)
Patient ID: Damon Peck, male    DOB: 11-11-1965  MRN: [id number]  CC: Hypertension   Subjective: Damon Peck is a 56 y.o. male who presents for chronic ds management His concerns today include:  [name], HL, obesity, OA LT knee, COVID-19 infection 05/2019.     HYPERTENSION Currently taking: see medication list.  He confirms taking amlodipine 10 mg daily, metoprolol XL 100 mg daily and HCTZ 25 mg daily. Med Adherence: [x]  Yes    []  No Medication side effects: []  Yes    [x]  No Adherence with salt restriction: [x]  Yes    []  No Home Monitoring?: [x]  Yes    []  No Monitoring Frequency: 2x/wk Home BP results range: 120/77, 13269 SOB? []  Yes    [x]  No Chest Pain?: []  Yes    [x]  No Leg swelling?: []  Yes    [x]  No Headaches?: []  Yes    [x]  No Dizziness? []  Yes    [x]  No Comments:   Pt will be leaving for for 1 mth in November.  Would like 61-month supply on his medications.  HL:  taking and tolerating Lipitor.  OA LT knee:  takes Tramadol 1-2 x day, depending on pain level.  Denies any significant side effects from the medication. Very active, goes to gym 3x/wk to walk on TM or ride bike.    HM:  declines flu shot, declines shingles vaccine Patient Active Problem List   Diagnosis Date Noted   Abnormal CT of the head 05/30/2018   Osteoarthritis of left knee 09/14/2017   It band syndrome, right 12/30/2016   Knee mass, left 12/09/2016   Obesity (BMI 30-39.9) 08/06/2016   Gastroesophageal reflux disease 04/26/2016   Midline low back pain without sciatica 08/09/2014   Dyslipidemia 04/24/2013   HTN (hypertension) 12/15/2012     Current Outpatient Medications on File Prior to Visit  Medication Sig Dispense Refill   aspirin EC 81 MG tablet Take 81 mg by mouth daily. Swallow whole.     cyclobenzaprine (FLEXERIL) 10 MG tablet Take 1 tablet (10 mg total) by mouth 2 (two) times daily as needed for muscle spasms. 60 tablet 2   Ibuprofen 200 MG CAPS Take 200 mg by  mouth as needed.     Magnesium 300 MG CAPS Take 1 capsule by mouth daily.     meloxicam (MOBIC) 15 MG tablet Take 1 tablet (15 mg total) by mouth daily. 60 tablet 1   metoprolol succinate (TOPROL-XL) 100 MG 24 hr tablet Take 1 tablet by mouth daily. 30 tablet 2   Multiple Vitamins-Minerals (CENTRUM SILVER 50+MEN PO) Take 1 tablet by mouth daily in the afternoon.     traMADol (ULTRAM) 50 MG tablet Take 1 tablet (50 mg total) by mouth every 12 (twelve) hours as needed. 60 tablet 0   No current facility-administered medications on file prior to visit.    No Known Allergies  Social History   Socioeconomic History   Marital status: Married    Spouse name: Not on file   Number of children: Not on file   Years of education: Not on file   Highest education level: Not on file  Occupational History   Not on file  Tobacco Use   Smoking status: Never   Smokeless tobacco: Never  Vaping Use   Vaping Use: Never used  Substance and Sexual Activity   Alcohol use: No   Drug use: No   Sexual activity: Yes    Birth  control/protection: Condom  Other Topics Concern   Not on file  Social History Narrative   Not on file   Social Determinants of Health   Financial Resource Strain: Not on file  Food Insecurity: Not on file  Transportation Needs: Not on file  Physical Activity: Not on file  Stress: Not on file  Social Connections: Not on file  Intimate Partner Violence: Not on file    Family History  Problem Relation Age of Onset   Diabetes Mother    Hypertension Mother    Diabetes Father    Hypertension Father     Past Surgical History:  Procedure Laterality Date   COLONOSCOPY WITH PROPOFOL N/A 02/11/2015   Procedure: COLONOSCOPY WITH PROPOFOL;  Surgeon: Charolett Bumpers, MD;  Location: [facility];  Service: Endoscopy;  Laterality: N/A;    ROS: Review of Systems Negative except as stated above  PHYSICAL EXAM: BP 134/76   Pulse 63   Temp 98.4 F (36.9 C) (Oral)   Ht 5\' 9"   (1.753 m)   Wt 230 lb 9.6 oz (104.6 kg)   SpO2 98%   BMI 34.05 kg/m   Wt Readings from Last 3 Encounters:  03/17/22 230 lb 9.6 oz (104.6 kg)  07/28/21 248 lb 9.6 oz (112.8 kg)  07/10/21 250 lb 12.8 oz (113.8 kg)    Physical Exam General appearance - alert, well appearing, and in no distress Mental status - normal mood, behavior, speech, dress, motor activity, and thought processes Chest - clear to auscultation, no wheezes, rales or rhonchi, symmetric air entry Heart - normal rate, regular rhythm, normal S1, S2, no murmurs, rubs, clicks or gallops Musculoskeletal -left knee: Mild joint enlargement.  Mild discomfort with passive range of motion.   Extremities - peripheral pulses normal, no pedal edema, no clubbing or cyanosis     Latest Ref Rng & Units 07/10/2021    2:48 PM 02/20/2021    3:46 PM 11/24/2020    4:31 PM  CMP  Glucose 70 - 99 mg/dL 87  11/26/2020  536   BUN 6 - 24 mg/dL 20  15  14    Creatinine 0.76 - 1.27 mg/dL 144   3.15   Sodium 134 - 144 mmol/L 140  134  139   Potassium 3.5 - 5.2 mmol/L 4.4  3.3  3.6   Chloride 96 - 106 mmol/L 96  96  100   CO2 20 - 29 mmol/L 28  26  29    Calcium 8.7 - 10.2 mg/dL 9.9  8.9  9.3   Total Protein 6.5 - 8.1 g/dL  7.7    Total Bilirubin 0.3 - 1.2 mg/dL  0.5    Alkaline Phos 38 - 126 U/L  50    AST 15 - 41 U/L  20    ALT 0 - 44 U/L  24     Lipid Panel     Component Value Date/Time   CHOL 150 07/10/2021 1448   TRIG 137 07/10/2021 1448   HDL 36 (L) 07/10/2021 1448   CHOLHDL 4.2 07/10/2021 1448   CHOLHDL 3.9 04/24/2016 1619   VLDL 33 (H) 04/24/2016 1619   LDLCALC 90 07/10/2021 1448    CBC    Component Value Date/Time   WBC 11.0 (H) 02/20/2021 1546   RBC 5.06 02/20/2021 1546   HGB 14.1 02/20/2021 1546   HGB 14.0 12/15/2019 1531   HCT 41.6 02/20/2021 1546   HCT 43.0 12/15/2019 1531   PLT 255 02/20/2021 1546   PLT  240 10/11/2018 1010   MCV 82.2 02/20/2021 1546   MCV 82 12/15/2019 1531   MCH 27.9 02/20/2021 1546   MCHC 33.9  02/20/2021 1546   RDW 13.9 02/20/2021 1546   RDW 13.9 12/15/2019 1531   LYMPHSABS 2.1 02/20/2021 1546   LYMPHSABS 2.4 12/15/2019 1531   MONOABS 1.0 02/20/2021 1546   EOSABS 0.4 02/20/2021 1546   EOSABS 0.4 12/15/2019 1531   BASOSABS 0.1 02/20/2021 1546   BASOSABS 0.1 12/15/2019 1531    ASSESSMENT AND PLAN: 1. Essential hypertension Close to goal.  Continue current medications and low-salt diet.  Refills sent on medications - CBC - hydrochlorothiazide (HYDRODIURIL) 25 MG tablet; Take 1 tablet by mouth daily.  Dispense: 90 tablet; Refill: 1 - amLODipine (NORVASC) 10 MG tablet; Take 1 tablet by mouth daily.  Dispense: 90 tablet; Refill: 1  2. Dyslipidemia Continue atorvastatin.  Last LDL in January of this year was at goal. - Hepatic Function Panel - atorvastatin (LIPITOR) 20 MG tablet; Take 1 tablet (20 mg total) by mouth daily.  Dispense: 90 tablet; Refill: 3  3. Osteoarthritis of left knee, unspecified osteoarthritis type Doing okay on tramadol without major side effects.  He is functional.  Micah Flesher over controlled substance prescribing agreement with him today and patient is agreeable to the agreement.  4. Obesity (BMI 30.0-34.9) Encouraged him to continue regular exercise.  Encouraged him to continue healthy eating habits. - CBC  5. Influenza vaccination declined Recommended.  Patient declined.    Patient was given the opportunity to ask questions.  Patient verbalized understanding of the plan and was able to repeat key elements of the plan.   This documentation was completed using Paediatric nurse.  Any transcriptional errors are unintentional.  Orders Placed This Encounter  Procedures   Hepatic Function Panel   CBC     Requested Prescriptions   Signed Prescriptions Disp Refills   hydrochlorothiazide (HYDRODIURIL) 25 MG tablet 90 tablet 1    Sig: Take 1 tablet by mouth daily.   amLODipine (NORVASC) 10 MG tablet 90 tablet 1    Sig: Take 1 tablet  by mouth daily.   atorvastatin (LIPITOR) 20 MG tablet 90 tablet 3    Sig: Take 1 tablet (20 mg total) by mouth daily.    Return in about 4 months (around 07/17/2022).  [name], MD, FACP

## 2022-03-18 LAB — CBC
Hematocrit: 41 % (ref 37.5–51.0)
Hemoglobin: 13.9 g/dL (ref 13.0–17.7)
MCH: 27.6 pg (ref 26.6–33.0)
MCHC: 33.9 g/dL (ref 31.5–35.7)
MCV: 81 fL (ref 79–97)
Platelets: 247 10*3/uL (ref 150–450)
RBC: 5.04 x10E6/uL (ref 4.14–5.80)
RDW: 13.2 % (ref 11.6–15.4)
WBC: 8 10*3/uL (ref 3.4–10.8)

## 2022-03-18 LAB — HEPATIC FUNCTION PANEL
ALT: 16 IU/L (ref 0–44)
AST: 13 IU/L (ref 0–40)
Albumin: 4.9 g/dL (ref 3.8–4.9)
Alkaline Phosphatase: 57 IU/L (ref 44–121)
Bilirubin Total: 0.3 mg/dL (ref 0.0–1.2)
Bilirubin, Direct: <0.1 mg/dL (ref 0.00–0.40)
Total Protein: 7.5 g/dL (ref 6.0–8.5)

## 2022-03-23 ENCOUNTER — Other Ambulatory Visit: Payer: Self-pay | Admitting: Internal Medicine

## 2022-03-23 ENCOUNTER — Other Ambulatory Visit: Payer: Self-pay

## 2022-03-23 DIAGNOSIS — I10 Essential (primary) hypertension: Secondary | ICD-10-CM

## 2022-03-23 MED ORDER — METOPROLOL SUCCINATE ER 100 MG PO TB24
100.0000 mg | ORAL_TABLET | Freq: Every day | ORAL | 2 refills | Status: DC
  Filled 2022-03-23: qty 30, 30d supply, fill #0
  Filled 2022-04-22: qty 30, 30d supply, fill #1
  Filled 2022-05-10 – 2022-05-12 (×2): qty 30, 30d supply, fill #2

## 2022-03-24 ENCOUNTER — Other Ambulatory Visit: Payer: Self-pay

## 2022-03-27 ENCOUNTER — Other Ambulatory Visit: Payer: Self-pay

## 2022-04-09 ENCOUNTER — Other Ambulatory Visit: Payer: Self-pay

## 2022-04-09 ENCOUNTER — Emergency Department (HOSPITAL_COMMUNITY): Payer: Self-pay

## 2022-04-09 ENCOUNTER — Emergency Department (HOSPITAL_COMMUNITY)
Admission: EM | Admit: 2022-04-09 | Discharge: 2022-04-09 | Disposition: A | Discharge status: Home / Self Care | Payer: Self-pay | Attending: Emergency Medicine | Admitting: Emergency Medicine

## 2022-04-09 DIAGNOSIS — K8020 Calculus of gallbladder without cholecystitis without obstruction: Secondary | ICD-10-CM | POA: Insufficient documentation

## 2022-04-09 DIAGNOSIS — R109 Unspecified abdominal pain: Secondary | ICD-10-CM

## 2022-04-09 DIAGNOSIS — R911 Solitary pulmonary nodule: Secondary | ICD-10-CM | POA: Insufficient documentation

## 2022-04-09 DIAGNOSIS — Z7982 Long term (current) use of aspirin: Secondary | ICD-10-CM | POA: Insufficient documentation

## 2022-04-09 DIAGNOSIS — I10 Essential (primary) hypertension: Secondary | ICD-10-CM | POA: Insufficient documentation

## 2022-04-09 DIAGNOSIS — Z79899 Other long term (current) drug therapy: Secondary | ICD-10-CM | POA: Insufficient documentation

## 2022-04-09 LAB — COMPREHENSIVE METABOLIC PANEL
ALT: 23 U/L (ref 0–44)
AST: 21 U/L (ref 15–41)
Albumin: 4.2 g/dL (ref 3.5–5.0)
Alkaline Phosphatase: 45 U/L (ref 38–126)
Anion gap: 7 (ref 5–15)
BUN: 15 mg/dL (ref 6–20)
CO2: 28 mmol/L (ref 22–32)
Calcium: 8.9 mg/dL (ref 8.9–10.3)
Chloride: 102 mmol/L (ref 98–111)
Creatinine, Ser: 0.67 mg/dL (ref 0.61–1.24)
GFR, Estimated: >60 mL/min (ref >60)
Glucose, Bld: 120 mg/dL — ABNORMAL HIGH (ref 70–99)
Potassium: 3.9 mmol/L (ref 3.5–5.1)
Sodium: 137 mmol/L (ref 135–145)
Total Bilirubin: 0.7 mg/dL (ref 0.3–1.2)
Total Protein: 7.5 g/dL (ref 6.5–8.1)

## 2022-04-09 LAB — CBC
HCT: 43.6 % (ref 39.0–52.0)
Hemoglobin: 14.1 g/dL (ref 13.0–17.0)
MCH: 27.6 pg (ref 26.0–34.0)
MCHC: 32.3 g/dL (ref 30.0–36.0)
MCV: 85.5 fL (ref 80.0–100.0)
Platelets: 238 10*3/uL (ref 150–400)
RBC: 5.1 MIL/uL (ref 4.22–5.81)
RDW: 14.1 % (ref 11.5–15.5)
WBC: 6.8 10*3/uL (ref 4.0–10.5)
nRBC: 0 % (ref 0.0–0.2)

## 2022-04-09 LAB — URINALYSIS, ROUTINE W REFLEX MICROSCOPIC
Bilirubin Urine: NEGATIVE
Glucose, UA: NEGATIVE mg/dL
Hgb urine dipstick: NEGATIVE
Ketones, ur: NEGATIVE mg/dL
Leukocytes,Ua: NEGATIVE
Nitrite: NEGATIVE
Protein, ur: NEGATIVE mg/dL
Specific Gravity, Urine: 1.012 (ref 1.005–1.030)
pH: 6 (ref 5.0–8.0)

## 2022-04-09 LAB — LIPASE, BLOOD: Lipase: 53 U/L — ABNORMAL HIGH (ref 11–51)

## 2022-04-09 MED ORDER — NAPROXEN 375 MG PO TABS
375.0000 mg | ORAL_TABLET | Freq: Two times a day (BID) | ORAL | 0 refills | Status: DC
  Filled 2022-04-09: qty 20, 10d supply, fill #0

## 2022-04-09 MED ORDER — CYCLOBENZAPRINE HCL 10 MG PO TABS
10.0000 mg | ORAL_TABLET | Freq: Two times a day (BID) | ORAL | 0 refills | Status: DC | PRN
  Filled 2022-04-09: qty 20, 10d supply, fill #0

## 2022-04-09 NOTE — ED Provider Triage Note (Signed)
Emergency Medicine Provider Triage Evaluation Note  Damon Peck , a 56 y.o. male  was evaluated in triage.  Pt complains of abdominal and flank pain. Started 2 weeks ago. Located in RUQ and extends to flank and down into right groin.Pain is dull. No changes in urination. No blood stools or hemoptysis. CT scan 3 months ago showed kidney stone on left side per patient.  No fever. No N/V/D.   Review of Systems  Positive: See above Negative:  See above  Physical Exam  BP (!) 180/85 (BP Location: Left Arm)   Pulse 69   Temp 98.5 F (36.9 C) (Oral)   Resp 18   Ht 5\' 11"  (1.803 m)   Wt 102.1 kg   SpO2 96%   BMI 31.38 kg/m  Gen:   Awake, no distress   Resp:  Normal effort  MSK:   Moves extremities without difficulty  Other:  No abdominal or cva tenderness  Medical Decision Making  Medically screening exam initiated at 12:21 PM.  Appropriate orders placed.  Estes Lehner was informed that the remainder of the evaluation will be completed by another provider, this initial triage assessment does not replace that evaluation, and the importance of remaining in the ED until their evaluation is complete.  Abdominal labs and UA   Harriet Pho, PA-C 04/09/22 1226

## 2022-04-09 NOTE — ED Triage Notes (Signed)
Pt reports right sided abd pain that radiates to the back. Denies urinary s/s. Denies n/v.

## 2022-04-09 NOTE — Discharge Instructions (Signed)
Take the medication as prescribed to help with the pain in the flank abdominal area.  Your blood tests and urine tests and CAT scan were otherwise reassuring.  No signs of any acute infection.  No signs of kidney stone.  You did have incidental gallstones but I do not think this is related to your pain.  He also had an incidental pulmonary nodule and aortic atherosclerosis.  Follow-up with your primary care doctor to recheck on your blood pressure and your cholesterol control.  Return as needed for fevers worsening symptoms

## 2022-04-09 NOTE — ED Provider Notes (Signed)
[facility]-EMERGENCY DEPT Provider Note   CSN: [id number] Arrival date & time: 04/09/22  1134     History  Chief Complaint  Patient presents with   Flank Pain   Abdominal Pain    Damon Peck is a 56 y.o. male.   Flank Pain Associated symptoms include abdominal pain.  Abdominal Pain    Patient has history of hypertension hypercholesterolemia who presents to the ED for evaluation of right-sided abdominal and flank pain.  Patient states he has noticed the symptoms for a couple of weeks now.  Patient had started a excise regimen with weights prior to the symptoms but does not recall any specific injury.  Patient states he is not having any nausea vomiting.  No trouble with his appetite.  No diarrhea.  Home Medications Prior to Admission medications   Medication Sig Start Date End Date Taking? Authorizing Provider  cyclobenzaprine (FLEXERIL) 10 MG tablet Take 1 tablet (10 mg total) by mouth 2 (two) times daily as needed for muscle spasms. 04/09/22  Yes Linwood Dibbles, MD  naproxen (NAPROSYN) 375 MG tablet Take 1 tablet (375 mg total) by mouth 2 (two) times daily. 04/09/22  Yes Linwood Dibbles, MD  amLODipine (NORVASC) 10 MG tablet Take 1 tablet by mouth daily. 03/17/22   Marcine Matar, MD  aspirin EC 81 MG tablet Take 81 mg by mouth daily. Swallow whole.    [provider]  atorvastatin (LIPITOR) 20 MG tablet Take 1 tablet (20 mg total) by mouth daily. 03/17/22   Marcine Matar, MD  hydrochlorothiazide (HYDRODIURIL) 25 MG tablet Take 1 tablet by mouth daily. 03/17/22   Marcine Matar, MD  Ibuprofen 200 MG CAPS Take 200 mg by mouth as needed.    [provider]  Magnesium 300 MG CAPS Take 1 capsule by mouth daily.    [provider]  metoprolol succinate (TOPROL-XL) 100 MG 24 hr tablet Take 1 tablet (100 mg total) by mouth daily. 03/23/22   Marcine Matar, MD  Multiple Vitamins-Minerals (CENTRUM SILVER 50+MEN PO) Take 1 tablet by mouth  daily in the afternoon.    [provider]  traMADol (ULTRAM) 50 MG tablet Take 1 tablet (50 mg total) by mouth every 12 (twelve) hours as needed. 03/11/22   Marcine Matar, MD      Allergies    Patient has no known allergies.    Review of Systems   Review of Systems  Gastrointestinal:  Positive for abdominal pain.  Genitourinary:  Positive for flank pain.    Physical Exam Updated Vital Signs BP (!) 180/85 (BP Location: Left Arm)   Pulse 69   Temp 98.5 F (36.9 C) (Oral)   Resp 18   Ht 1.803 m (5\' 11" )   Wt 102.1 kg   SpO2 96%   BMI 31.38 kg/m  Physical Exam Vitals and nursing note reviewed.  Constitutional:      General: He is not in acute distress.    Appearance: He is well-developed.  HENT:     Head: Normocephalic and atraumatic.     Right Ear: External ear normal.     Left Ear: External ear normal.  Eyes:     General: No scleral icterus.       Right eye: No discharge.        Left eye: No discharge.     Conjunctiva/sclera: Conjunctivae normal.  Neck:     Trachea: No tracheal deviation.  Cardiovascular:     Rate and  Rhythm: Normal rate and regular rhythm.  Pulmonary:     Effort: Pulmonary effort is normal. No respiratory distress.     Breath sounds: Normal breath sounds. No stridor. No wheezing or rales.  Abdominal:     General: Bowel sounds are normal. There is no distension.     Palpations: Abdomen is soft.     Tenderness: There is no abdominal tenderness. There is no right CVA tenderness, guarding or rebound.  Musculoskeletal:        General: No tenderness or deformity.     Cervical back: Neck supple.  Skin:    General: Skin is warm and dry.     Findings: No rash.  Neurological:     General: No focal deficit present.     Mental Status: He is alert.     Cranial Nerves: No cranial nerve deficit (no facial droop, extraocular movements intact, no slurred speech).     Sensory: No sensory deficit.     Motor: No abnormal muscle tone or seizure  activity.     Coordination: Coordination normal.  Psychiatric:        Mood and Affect: Mood normal.     ED Results / Procedures / Treatments   Labs (all labs ordered are listed, but only abnormal results are displayed) Labs Reviewed  LIPASE, BLOOD - Abnormal; Notable for the following components:      Result Value   Lipase 53 (*)    All other components within normal limits  COMPREHENSIVE METABOLIC PANEL - Abnormal; Notable for the following components:   Glucose, Bld 120 (*)    All other components within normal limits  CBC  URINALYSIS, ROUTINE W REFLEX MICROSCOPIC    EKG None  Radiology CT Renal Stone Study  Result Date: 04/09/2022 CLINICAL DATA:  Two week history of right flank and groin pain. EXAM: CT ABDOMEN AND PELVIS WITHOUT CONTRAST TECHNIQUE: Multidetector CT imaging of the abdomen and pelvis was performed following the standard protocol without IV contrast. RADIATION DOSE REDUCTION: This exam was performed according to the departmental dose-optimization program which includes automated exposure control, adjustment of the mA and/or kV according to patient size and/or use of iterative reconstruction technique. COMPARISON:  None Available. FINDINGS: Lower chest: The lung bases are clear of an acute process. No pleural effusions or pleural lesions. There is a 4.5 mm subpleural pulmonary nodule in the left lower lobe. This is likely benign. No follow-up imaging is necessary unless the patient is high risk (history of cancer, smoking, etc.). If that is the case, a 6-12 month follow-up chest CT without contrast is suggested. Age advanced coronary artery calcifications are noted. Hepatobiliary: No hepatic lesions or intrahepatic biliary dilatation. There is a E 2 cm gallstone noted the gallbladder. No CT findings for acute cholecystitis. Normal caliber common bile duct. Pancreas: No mass, inflammation or ductal dilatation. Spleen: Normal size.  No focal lesions. Adrenals/Urinary Tract:  Adrenal glands are normal. Lower pole left renal calculus is noted but no CT findings for obstructing ureteral calculi. No bladder calculi. No worrisome renal lesions are identified without contrast. There is a small hyperdense/hemorrhagic cyst associated with the midpole region of the left kidney anteriorly. No imaging follow-up is necessary. Stomach/Bowel: The stomach, duodenum, small bowel and colon are unremarkable. No acute inflammatory process, mass lesions or obstructive findings. The terminal ileum and appendix are normal. There is diffuse colonic diverticulosis but no findings for acute diverticulitis. Vascular/Lymphatic: Age advanced atherosclerotic calcification involving the aorta and branch vessels but no aneurysm.  No mesenteric or retroperitoneal mass or adenopathy. Reproductive: Mild prostate gland enlargement. The seminal vesicles are unremarkable. Other: No pelvic mass or adenopathy. No free pelvic fluid collections. No inguinal mass or adenopathy. No abdominal wall hernia or subcutaneous lesions. No inguinal hernia. Musculoskeletal: No significant bony findings. Moderate degenerative disc disease noted at L4-5. IMPRESSION: 1. No acute abdominal/pelvic findings, mass lesions or adenopathy. 2. Lower pole left renal calculus but no CT findings for obstructing ureteral calculi. 3. Cholelithiasis. 4. Age advanced atherosclerotic calcification involving the aorta and branch vessels. 5. 4.5 mm subpleural pulmonary nodule in the left lower lobe. This is likely benign. No follow-up imaging is necessary unless the patient is high risk (history of cancer, smoking, etc.). If that is the case, a 6-12 month follow-up chest CT without contrast is suggested. 6. Aortic atherosclerosis. Aortic Atherosclerosis (ICD10-I70.0). Electronically Signed   By: Marijo Sanes M.D.   On: 04/09/2022 13:28    Procedures Procedures    Medications Ordered in ED Medications - No data to display  ED Course/ Medical Decision  Making/ A&P                           Medical Decision Making Problems Addressed: Flank pain: acute illness or injury Gallstones: chronic illness or injury Pulmonary nodule: chronic illness or injury  Amount and/or Complexity of Data Reviewed Labs: ordered. Decision-making details documented in ED Course. Radiology: ordered and independent interpretation performed.   Patient presented to the ED for evaluation of flank pain.  ED work-up reassuring.  No signs of pancreatitis or hepatitis.  Lipase slightly elevated but do not think this is clinically significant.  Urinalysis does not show signs of infection.  CT scan does not show any acute abnormality to account for his pain.  Incidental pulmonary nodule and cholelithiasis noted.  I do not think this is related to his symptoms.  Patient also noted to have atherosclerosis.  I did discuss this finding with the patient and recommended follow-up with his doctor to make sure his blood pressure and cholesterol is controlled.  Suspect the symptoms are most likely musculoskeletal in nature.  We will have him try a course of NSAIDs and muscle relaxant.  Recommend outpatient follow-up with his PCP        Final Clinical Impression(s) / ED Diagnoses Final diagnoses:  Flank pain  Pulmonary nodule  Gallstones  Hypertension, unspecified type    Rx / DC Orders ED Discharge Orders          Ordered    naproxen (NAPROSYN) 375 MG tablet  2 times daily        04/09/22 1440    cyclobenzaprine (FLEXERIL) 10 MG tablet  2 times daily PRN        04/09/22 1440              Dorie Rank, MD 04/09/22 1440

## 2022-04-22 ENCOUNTER — Other Ambulatory Visit: Payer: Self-pay

## 2022-05-11 ENCOUNTER — Other Ambulatory Visit: Payer: Self-pay

## 2022-05-12 ENCOUNTER — Other Ambulatory Visit: Payer: Self-pay

## 2022-06-19 ENCOUNTER — Other Ambulatory Visit: Payer: Self-pay | Admitting: Internal Medicine

## 2022-06-19 ENCOUNTER — Other Ambulatory Visit: Payer: Self-pay

## 2022-06-19 DIAGNOSIS — I10 Essential (primary) hypertension: Secondary | ICD-10-CM

## 2022-06-19 MED ORDER — METOPROLOL SUCCINATE ER 100 MG PO TB24
100.0000 mg | ORAL_TABLET | Freq: Every day | ORAL | 3 refills | Status: DC
  Filled 2022-06-19: qty 30, 30d supply, fill #0
  Filled 2022-07-07 – 2022-07-14 (×3): qty 30, 30d supply, fill #1
  Filled 2022-08-25 (×2): qty 30, 30d supply, fill #2
  Filled 2022-09-23: qty 30, 30d supply, fill #3

## 2022-06-22 ENCOUNTER — Other Ambulatory Visit: Payer: Self-pay | Admitting: Internal Medicine

## 2022-06-22 DIAGNOSIS — M1712 Unilateral primary osteoarthritis, left knee: Secondary | ICD-10-CM

## 2022-06-22 MED ORDER — TRAMADOL HCL 50 MG PO TABS
50.0000 mg | ORAL_TABLET | Freq: Two times a day (BID) | ORAL | 0 refills | Status: DC | PRN
  Filled 2022-06-22: qty 60, 30d supply, fill #0

## 2022-06-23 ENCOUNTER — Other Ambulatory Visit: Payer: Self-pay

## 2022-07-07 ENCOUNTER — Other Ambulatory Visit: Payer: Self-pay

## 2022-07-13 ENCOUNTER — Other Ambulatory Visit: Payer: Self-pay

## 2022-07-14 ENCOUNTER — Other Ambulatory Visit: Payer: Self-pay

## 2022-07-15 ENCOUNTER — Ambulatory Visit
Admission: RE | Admit: 2022-07-15 | Discharge: 2022-07-15 | Disposition: A | Discharge status: Home / Self Care | Payer: No Typology Code available for payment source | Source: Ambulatory Visit | Attending: Cardiology | Admitting: Cardiology

## 2022-07-15 DIAGNOSIS — R911 Solitary pulmonary nodule: Secondary | ICD-10-CM

## 2022-07-17 ENCOUNTER — Ambulatory Visit: Payer: Self-pay | Admitting: Internal Medicine

## 2022-07-24 ENCOUNTER — Encounter: Payer: Self-pay | Admitting: *Deleted

## 2022-08-24 ENCOUNTER — Other Ambulatory Visit: Payer: Self-pay

## 2022-08-25 ENCOUNTER — Other Ambulatory Visit: Payer: Self-pay

## 2022-09-01 ENCOUNTER — Other Ambulatory Visit: Payer: Self-pay | Admitting: Internal Medicine

## 2022-09-01 ENCOUNTER — Other Ambulatory Visit: Payer: Self-pay

## 2022-09-01 DIAGNOSIS — I10 Essential (primary) hypertension: Secondary | ICD-10-CM

## 2022-09-01 MED ORDER — HYDROCHLOROTHIAZIDE 25 MG PO TABS
25.0000 mg | ORAL_TABLET | Freq: Every day | ORAL | 0 refills | Status: DC
  Filled 2022-09-01: qty 90, fill #0
  Filled 2022-09-02: qty 30, 30d supply, fill #0
  Filled 2022-10-20: qty 30, 30d supply, fill #1
  Filled 2022-11-17: qty 30, 30d supply, fill #2

## 2022-09-01 MED ORDER — AMLODIPINE BESYLATE 10 MG PO TABS
10.0000 mg | ORAL_TABLET | Freq: Every day | ORAL | 0 refills | Status: DC
  Filled 2022-09-01: qty 90, fill #0
  Filled 2022-09-02: qty 30, 30d supply, fill #0
  Filled 2022-10-20: qty 30, 30d supply, fill #1
  Filled 2022-11-17: qty 30, 30d supply, fill #2

## 2022-09-01 NOTE — Telephone Encounter (Signed)
Requested Prescriptions  Pending Prescriptions Disp Refills   hydrochlorothiazide (HYDRODIURIL) 25 MG tablet 90 tablet 0    Sig: Take 1 tablet by mouth daily.     Cardiovascular: Diuretics - Thiazide Failed - 09/01/2022  8:49 AM      Failed - Last BP in normal range    BP Readings from Last 1 Encounters:  04/09/22 (!) 180/85         Passed - Cr in normal range and within 180 days    Creat  Date Value Ref Range Status  04/24/2016 0.86 0.70 - 1.33 mg/dL Final    Comment:      For patients > or = 59 years of age: The upper reference limit for Creatinine is approximately 13% higher for people identified as African-American.      Creatinine, Ser  Date Value Ref Range Status  04/09/2022 0.67 0.61 - 1.24 mg/dL Final         Passed - K in normal range and within 180 days    Potassium  Date Value Ref Range Status  04/09/2022 3.9 3.5 - 5.1 mmol/L Final         Passed - Na in normal range and within 180 days    Sodium  Date Value Ref Range Status  04/09/2022 137 135 - 145 mmol/L Final  07/10/2021 140 134 - 144 mmol/L Final         Passed - Valid encounter within last 6 months    Recent Outpatient Visits           5 months ago Essential hypertension   [place], [place]   1 year ago Other osteoarthritis of spine, lumbar region   [place] [facility] Charlott Rakes, MD   1 year ago Essential hypertension   [place], Deborah B, MD   1 year ago Essential hypertension   [place], Deborah B, MD   2 years ago Essential hypertension   [place], MD       Future Appointments             In 1 month Ladell Pier, MD [place]             amLODipine (NORVASC) 10 MG tablet 90 tablet 0    Sig: Take 1 tablet  by mouth daily.     Cardiovascular: Calcium Channel Blockers 2 Failed - 09/01/2022  8:49 AM      Failed - Last BP in normal range    BP Readings from Last 1 Encounters:  04/09/22 (!) 180/85         Passed - Last Heart Rate in normal range    Pulse Readings from Last 1 Encounters:  04/09/22 69         Passed - Valid encounter within last 6 months    Recent Outpatient Visits           5 months ago Essential hypertension   [place] Ladell Pier, MD   1 year ago Other osteoarthritis of spine, lumbar region   [place], MD   1 year ago Essential hypertension   [place] Ladell Pier, MD   1 year ago  Essential hypertension   [place] Ladell Pier, MD   2 years ago Essential hypertension   [place], MD       Future Appointments             In 1 month Wynetta Emery, Dalbert Batman, MD [place]

## 2022-09-02 ENCOUNTER — Other Ambulatory Visit: Payer: Self-pay

## 2022-09-22 ENCOUNTER — Ambulatory Visit: Payer: Self-pay | Admitting: Internal Medicine

## 2022-09-23 ENCOUNTER — Other Ambulatory Visit: Payer: Self-pay

## 2022-10-13 ENCOUNTER — Ambulatory Visit: Payer: Self-pay | Admitting: Internal Medicine

## 2022-10-20 ENCOUNTER — Other Ambulatory Visit: Payer: Self-pay

## 2022-10-20 ENCOUNTER — Other Ambulatory Visit: Payer: Self-pay | Admitting: Internal Medicine

## 2022-10-20 DIAGNOSIS — M1712 Unilateral primary osteoarthritis, left knee: Secondary | ICD-10-CM

## 2022-10-20 DIAGNOSIS — I10 Essential (primary) hypertension: Secondary | ICD-10-CM

## 2022-10-20 MED ORDER — METOPROLOL SUCCINATE ER 100 MG PO TB24
100.0000 mg | ORAL_TABLET | Freq: Every day | ORAL | 0 refills | Status: DC
  Filled 2022-10-20: qty 30, 30d supply, fill #0
  Filled 2022-11-17: qty 30, 30d supply, fill #1
  Filled 2022-12-14 – 2022-12-15 (×2): qty 30, 30d supply, fill #2

## 2022-10-21 ENCOUNTER — Other Ambulatory Visit: Payer: Self-pay

## 2022-10-21 MED ORDER — TRAMADOL HCL 50 MG PO TABS
50.0000 mg | ORAL_TABLET | Freq: Two times a day (BID) | ORAL | 0 refills | Status: DC | PRN
  Filled 2022-10-21: qty 60, 30d supply, fill #0

## 2022-11-17 ENCOUNTER — Other Ambulatory Visit: Payer: Self-pay

## 2022-12-14 ENCOUNTER — Other Ambulatory Visit: Payer: Self-pay | Admitting: Internal Medicine

## 2022-12-14 DIAGNOSIS — I10 Essential (primary) hypertension: Secondary | ICD-10-CM

## 2022-12-15 ENCOUNTER — Ambulatory Visit: Payer: Self-pay | Attending: Family Medicine

## 2022-12-15 ENCOUNTER — Other Ambulatory Visit: Payer: Self-pay

## 2022-12-15 MED ORDER — HYDROCHLOROTHIAZIDE 25 MG PO TABS
25.0000 mg | ORAL_TABLET | Freq: Every day | ORAL | 0 refills | Status: DC
  Filled 2022-12-15: qty 90, 90d supply, fill #0

## 2022-12-15 MED ORDER — AMLODIPINE BESYLATE 10 MG PO TABS
10.0000 mg | ORAL_TABLET | Freq: Every day | ORAL | 0 refills | Status: DC
  Filled 2022-12-15: qty 90, 90d supply, fill #0

## 2023-01-18 ENCOUNTER — Other Ambulatory Visit: Payer: Self-pay

## 2023-01-18 ENCOUNTER — Encounter: Payer: Self-pay | Admitting: Internal Medicine

## 2023-01-18 ENCOUNTER — Ambulatory Visit: Payer: Self-pay | Attending: Internal Medicine | Admitting: Internal Medicine

## 2023-01-18 VITALS — BP 129/77 | HR 67 | Temp 98.4°F | Ht 71.0 in | Wt 225.0 lb

## 2023-01-18 DIAGNOSIS — M1712 Unilateral primary osteoarthritis, left knee: Secondary | ICD-10-CM

## 2023-01-18 DIAGNOSIS — M799 Soft tissue disorder, unspecified: Secondary | ICD-10-CM

## 2023-01-18 DIAGNOSIS — I10 Essential (primary) hypertension: Secondary | ICD-10-CM

## 2023-01-18 DIAGNOSIS — E785 Hyperlipidemia, unspecified: Secondary | ICD-10-CM

## 2023-01-18 MED ORDER — AMLODIPINE BESYLATE 10 MG PO TABS
10.0000 mg | ORAL_TABLET | Freq: Every day | ORAL | 1 refills | Status: DC
  Filled 2023-01-18 – 2023-03-24 (×2): qty 90, 90d supply, fill #0

## 2023-01-18 MED ORDER — ATORVASTATIN CALCIUM 20 MG PO TABS
20.0000 mg | ORAL_TABLET | Freq: Every day | ORAL | 3 refills | Status: DC
  Filled 2023-01-18 – 2023-03-15 (×2): qty 90, 90d supply, fill #0
  Filled 2023-05-30 – 2023-05-31 (×2): qty 90, 90d supply, fill #1
  Filled 2023-08-24: qty 90, 90d supply, fill #2

## 2023-01-18 MED ORDER — METOPROLOL SUCCINATE ER 100 MG PO TB24
100.0000 mg | ORAL_TABLET | Freq: Every day | ORAL | 1 refills | Status: DC
  Filled 2023-01-18 – 2023-01-25 (×3): qty 90, 90d supply, fill #0
  Filled 2023-04-25: qty 90, 90d supply, fill #1
  Filled 2023-04-26: qty 30, 30d supply, fill #1

## 2023-01-18 MED ORDER — TRAMADOL HCL 50 MG PO TABS
50.0000 mg | ORAL_TABLET | Freq: Two times a day (BID) | ORAL | 0 refills | Status: DC | PRN
  Filled 2023-01-18: qty 60, 30d supply, fill #0

## 2023-01-18 MED ORDER — HYDROCHLOROTHIAZIDE 25 MG PO TABS
25.0000 mg | ORAL_TABLET | Freq: Every day | ORAL | 1 refills | Status: DC
  Filled 2023-01-18 – 2023-03-24 (×2): qty 90, 90d supply, fill #0

## 2023-01-18 NOTE — Progress Notes (Signed)
Patient ID: Damon Peck, male    DOB: [birth year]  MRN: [id number]  CC: Hypertension (HTN f/u. Med refills. - pt leaving country for 1 mo. /No questions / concerns/No to shingles vax. )   Subjective: Damon Peck is a 57 y.o. male who presents for chronic ds management His concerns today include:  [name], HL, obesity, OA LT knee, COVID-19 infection 05/2019.     HTN:     He confirms taking amlodipine 10 mg daily, metoprolol XL 100 mg daily and HCTZ 25 mg daily. Med Adherence: [x]  Yes    []  No Medication side effects: []  Yes    [x]  No Adherence with salt restriction: [x]  Yes    []  No Home Monitoring?: [x]  Yes    []  No Monitoring Frequency: 2x/wk Home BP results range: Reports readings have been good but did not have readings to give me today. SOB? []  Yes    [x]  No Chest Pain?: []  Yes    [x]  No Leg swelling?: []  Yes    [x]  No Headaches?: []  Yes    [x]  No Dizziness? []  Yes    [x]  No Comments:   He will be traveling outside of the country for 10 days.  Would like refills on medications.   Exercising daily at gym  OA LT knee: Uses tramadol several times a week for pain in the left knee.  Reports good relief with the medicine.  No significant side effects.  He has a cystic growth on the lateral aspect of the left knee which she states has been there for over a year.  It has not increased in size and not very bothersome to him. HL:  taking and tolerating atorvastatin.  Patient Active Problem List   Diagnosis Date Noted   Abnormal CT of the head 05/30/2018   Osteoarthritis of left knee 09/14/2017   It band syndrome, right 12/30/2016   Knee mass, left 12/09/2016   Obesity (BMI 30-39.9) 08/06/2016   Gastroesophageal reflux disease 04/26/2016   Midline low back pain without sciatica 08/09/2014   Dyslipidemia 04/24/2013   HTN (hypertension) 12/15/2012     Current Outpatient Medications on File Prior to Visit  Medication Sig Dispense Refill   amLODipine (NORVASC) 10 MG  tablet Take 1 tablet (10 mg total) by mouth daily. 90 tablet 0   aspirin EC 81 MG tablet Take 81 mg by mouth daily. Swallow whole.     atorvastatin (LIPITOR) 20 MG tablet Take 1 tablet (20 mg total) by mouth daily. 90 tablet 3   cyclobenzaprine (FLEXERIL) 10 MG tablet Take 1 tablet (10 mg total) by mouth 2 (two) times daily as needed for muscle spasms. 20 tablet 0   hydrochlorothiazide (HYDRODIURIL) 25 MG tablet Take 1 tablet (25 mg total) by mouth daily. 90 tablet 0   Ibuprofen 200 MG CAPS Take 200 mg by mouth as needed.     metoprolol succinate (TOPROL-XL) 100 MG 24 hr tablet Take 1 tablet (100 mg total) by mouth daily. 90 tablet 0   Multiple Vitamins-Minerals (CENTRUM SILVER 50+MEN PO) Take 1 tablet by mouth daily in the afternoon.     traMADol (ULTRAM) 50 MG tablet Take 1 tablet (50 mg total) by mouth every 12 (twelve) hours as needed. 60 tablet 0   naproxen (NAPROSYN) 375 MG tablet Take 1 tablet (375 mg total) by mouth 2 (two) times daily. (Patient not taking: Reported on 01/18/2023) 20 tablet 0   No current facility-administered medications on file prior  to visit.    No Known Allergies  Social History   Socioeconomic History   Marital status: Married    Spouse name: Not on file   Number of children: Not on file   Years of education: Not on file   Highest education level: Not on file  Occupational History   Not on file  Tobacco Use   Smoking status: Never   Smokeless tobacco: Never  Vaping Use   Vaping status: Never Used  Substance and Sexual Activity   Alcohol use: No   Drug use: No   Sexual activity: Yes    Birth control/protection: Condom  Other Topics Concern   Not on file  Social History Narrative   Not on file   Social Determinants of Health   Financial Resource Strain: Not on file  Food Insecurity: Not on file  Transportation Needs: Not on file  Physical Activity: Not on file  Stress: Not on file  Social Connections: Not on file  Intimate Partner Violence:  Not on file    Family History  Problem Relation Age of Onset   Diabetes Mother    Hypertension Mother    Diabetes Father    Hypertension Father     Past Surgical History:  Procedure Laterality Date   COLONOSCOPY WITH PROPOFOL N/A 02/11/2015   Procedure: COLONOSCOPY WITH PROPOFOL;  Surgeon: Charolett Bumpers, MD;  Location: [facility] ENDOSCOPY;  Service: Endoscopy;  Laterality: N/A;    ROS: Review of Systems Negative except as stated above  PHYSICAL EXAM: BP 129/77 (BP Location: Left Arm, Patient Position: Sitting, Cuff Size: Normal)   Pulse 67   Temp 98.4 F (36.9 C) (Oral)   Ht 5\' 11"  (1.803 m)   Wt 225 lb (102.1 kg)   SpO2 97%   BMI 31.38 kg/m   Physical Exam  General appearance - alert, well appearing, and in no distress Mental status - normal mood, behavior, speech, dress, motor activity, and thought processes Neck - supple, no significant adenopathy Chest - clear to auscultation, no wheezes, rales or rhonchi, symmetric air entry Heart - normal rate, regular rhythm, normal S1, S2, no murmurs, rubs, clicks or gallops Extremities - peripheral pulses normal, no pedal edema, no clubbing or cyanosis MSK: Left knee: No edema or erythema.  He has a noted 4 x 4 cm soft movable mass lateral and just below the kneecap.  It is nontender to touch.     Latest Ref Rng & Units 04/09/2022   11:46 AM 03/17/2022    3:36 PM 07/10/2021    2:48 PM  CMP  Glucose 70 - 99 mg/dL 478   87   BUN 6 - 20 mg/dL 15   20   Creatinine 2.95 - 1.24 mg/dL 6.21   3.08   Sodium 657 - 145 mmol/L 137   140   Potassium 3.5 - 5.1 mmol/L 3.9   4.4   Chloride 98 - 111 mmol/L 102   96   CO2 22 - 32 mmol/L 28   28   Calcium 8.9 - 10.3 mg/dL 8.9   9.9   Total Protein 6.5 - 8.1 g/dL 7.5  7.5    Total Bilirubin 0.3 - 1.2 mg/dL 0.7  0.3    Alkaline Phos 38 - 126 U/L 45  57    AST 15 - 41 U/L 21  13    ALT 0 - 44 U/L 23  16     Lipid Panel     Component Value Date/Time  CHOL 150 07/10/2021 1448   TRIG 137  07/10/2021 1448   HDL 36 (L) 07/10/2021 1448   CHOLHDL 4.2 07/10/2021 1448   CHOLHDL 3.9 04/24/2016 1619   VLDL 33 (H) 04/24/2016 1619   LDLCALC 90 07/10/2021 1448    CBC    Component Value Date/Time   WBC 6.8 04/09/2022 1146   RBC 5.10 04/09/2022 1146   HGB 14.1 04/09/2022 1146   HGB 13.9 03/17/2022 1536   HCT 43.6 04/09/2022 1146   HCT 41.0 03/17/2022 1536   PLT 238 04/09/2022 1146   PLT 247 03/17/2022 1536   MCV 85.5 04/09/2022 1146   MCV 81 03/17/2022 1536   MCH 27.6 04/09/2022 1146   MCHC 32.3 04/09/2022 1146   RDW 14.1 04/09/2022 1146   RDW 13.2 03/17/2022 1536   LYMPHSABS 2.1 02/20/2021 1546   LYMPHSABS 2.4 12/15/2019 1531   MONOABS 1.0 02/20/2021 1546   EOSABS 0.4 02/20/2021 1546   EOSABS 0.4 12/15/2019 1531   BASOSABS 0.1 02/20/2021 1546   BASOSABS 0.1 12/15/2019 1531    ASSESSMENT AND PLAN: 1. Essential hypertension At goal.  Continue amlodipine, HCTZ and metoprolol. - amLODipine (NORVASC) 10 MG tablet; Take 1 tablet (10 mg total) by mouth daily.  Dispense: 90 tablet; Refill: 1 - hydrochlorothiazide (HYDRODIURIL) 25 MG tablet; Take 1 tablet (25 mg total) by mouth daily.  Dispense: 90 tablet; Refill: 1 - metoprolol succinate (TOPROL-XL) 100 MG 24 hr tablet; Take 1 tablet (100 mg total) by mouth daily.  Dispense: 90 tablet; Refill: 1  2. Dyslipidemia - atorvastatin (LIPITOR) 20 MG tablet; Take 1 tablet (20 mg total) by mouth daily.  Dispense: 90 tablet; Refill: 3  3. Osteoarthritis of left knee, unspecified osteoarthritis type [place] controlled substance reporting system reviewed and is appropriate.  He has an up-to-date controlled substance prescribing agreement in the EMR.  Reports good relief of pain from tramadol without significant side effects.  Refill given today. - traMADol (ULTRAM) 50 MG tablet; Take 1 tablet (50 mg total) by mouth every 12 (twelve) hours as needed.  Dispense: 60 tablet; Refill: 0  4. Soft tissue lesion of knee region Looks  like this may be a cyst.  Will refer to orthopedics for evaluation. - Ambulatory referral to Orthopedics    Patient was given the opportunity to ask questions.  Patient verbalized understanding of the plan and was able to repeat key elements of the plan.   This documentation was completed using Paediatric nurse.  Any transcriptional errors are unintentional.  No orders of the defined types were placed in this encounter.    Requested Prescriptions   Pending Prescriptions Disp Refills   amLODipine (NORVASC) 10 MG tablet 90 tablet 0    Sig: Take 1 tablet (10 mg total) by mouth daily.   atorvastatin (LIPITOR) 20 MG tablet 90 tablet 3    Sig: Take 1 tablet (20 mg total) by mouth daily.   hydrochlorothiazide (HYDRODIURIL) 25 MG tablet 90 tablet 0    Sig: Take 1 tablet (25 mg total) by mouth daily.   metoprolol succinate (TOPROL-XL) 100 MG 24 hr tablet 90 tablet 0    Sig: Take 1 tablet (100 mg total) by mouth daily.   traMADol (ULTRAM) 50 MG tablet 60 tablet 0    Sig: Take 1 tablet (50 mg total) by mouth every 12 (twelve) hours as needed.    No follow-ups on file.  [name], MD, FACP

## 2023-01-22 ENCOUNTER — Other Ambulatory Visit: Payer: Self-pay

## 2023-01-25 ENCOUNTER — Other Ambulatory Visit: Payer: Self-pay

## 2023-01-26 ENCOUNTER — Ambulatory Visit: Payer: Self-pay

## 2023-03-02 ENCOUNTER — Ambulatory Visit: Payer: Self-pay | Admitting: Orthopaedic Surgery

## 2023-03-15 ENCOUNTER — Other Ambulatory Visit: Payer: Self-pay

## 2023-03-24 ENCOUNTER — Other Ambulatory Visit: Payer: Self-pay

## 2023-03-24 ENCOUNTER — Other Ambulatory Visit: Payer: Self-pay | Admitting: Internal Medicine

## 2023-03-24 DIAGNOSIS — M1712 Unilateral primary osteoarthritis, left knee: Secondary | ICD-10-CM

## 2023-03-25 MED ORDER — TRAMADOL HCL 50 MG PO TABS
50.0000 mg | ORAL_TABLET | Freq: Two times a day (BID) | ORAL | 0 refills | Status: DC | PRN
  Filled 2023-03-25: qty 60, 30d supply, fill #0

## 2023-03-26 ENCOUNTER — Other Ambulatory Visit: Payer: Self-pay

## 2023-04-26 ENCOUNTER — Other Ambulatory Visit: Payer: Self-pay

## 2023-04-28 ENCOUNTER — Other Ambulatory Visit: Payer: Self-pay

## 2023-05-24 ENCOUNTER — Other Ambulatory Visit: Payer: Self-pay

## 2023-05-24 ENCOUNTER — Ambulatory Visit: Payer: Self-pay | Attending: Internal Medicine | Admitting: Internal Medicine

## 2023-05-24 VITALS — BP 133/82 | HR 70 | Temp 98.4°F | Ht 71.0 in | Wt 240.0 lb

## 2023-05-24 DIAGNOSIS — I10 Essential (primary) hypertension: Secondary | ICD-10-CM

## 2023-05-24 DIAGNOSIS — E669 Obesity, unspecified: Secondary | ICD-10-CM

## 2023-05-24 DIAGNOSIS — E66811 Obesity, class 1: Secondary | ICD-10-CM

## 2023-05-24 DIAGNOSIS — Z6833 Body mass index (BMI) 33.0-33.9, adult: Secondary | ICD-10-CM

## 2023-05-24 DIAGNOSIS — M1712 Unilateral primary osteoarthritis, left knee: Secondary | ICD-10-CM

## 2023-05-24 DIAGNOSIS — Z2821 Immunization not carried out because of patient refusal: Secondary | ICD-10-CM

## 2023-05-24 DIAGNOSIS — E785 Hyperlipidemia, unspecified: Secondary | ICD-10-CM

## 2023-05-24 MED ORDER — METOPROLOL SUCCINATE ER 100 MG PO TB24
100.0000 mg | ORAL_TABLET | Freq: Every day | ORAL | 1 refills | Status: DC
  Filled 2023-05-24 – 2023-05-27 (×3): qty 90, 90d supply, fill #0
  Filled 2023-08-23 – 2023-08-24 (×2): qty 90, 90d supply, fill #1

## 2023-05-24 MED ORDER — TRAMADOL HCL 50 MG PO TABS
50.0000 mg | ORAL_TABLET | Freq: Two times a day (BID) | ORAL | 0 refills | Status: DC | PRN
  Filled 2023-05-24: qty 60, 30d supply, fill #0

## 2023-05-24 MED ORDER — HYDROCHLOROTHIAZIDE 25 MG PO TABS
25.0000 mg | ORAL_TABLET | Freq: Every day | ORAL | 1 refills | Status: DC
  Filled 2023-05-24 – 2023-05-27 (×2): qty 90, 90d supply, fill #0
  Filled 2023-08-23 – 2023-08-24 (×2): qty 90, 90d supply, fill #1

## 2023-05-24 MED ORDER — AMLODIPINE BESYLATE 10 MG PO TABS
10.0000 mg | ORAL_TABLET | Freq: Every day | ORAL | 1 refills | Status: DC
  Filled 2023-05-24 – 2023-05-27 (×2): qty 90, 90d supply, fill #0
  Filled 2023-08-24: qty 90, 90d supply, fill #1

## 2023-05-24 NOTE — Progress Notes (Signed)
Patient ID: Damon Peck, male    DOB: [birth year]  MRN: [id number]  CC: Hypertension (HTN f/u. Med refill. /No questions / concerns/No to flu vax)   Subjective: Damon Peck is a 57 y.o. male who presents for chronic ds management. His concerns today include:  [name], HL, obesity, OA LT knee, COVID-19 infection 05/2019.     HTN: He confirms taking amlodipine 10 mg daily, metoprolol XL 100 mg daily and HCTZ 25 mg daily.  Still working out 4x/wk Doing well with eating habits No chest pains or shortness of breath.  No lower extremity edema. He is taking and tolerating atorvastatin 20 mg daily. OA LT knee: uses bike in gym more than TM because later causes a lot pain after use.  Also has a lot of pain and stiffness after kneeling to pray. No swelling.  Takes tramadol at least once a day.  Tolerating the medication okay without significant side effects.  He is able to function during the day. Patient Active Problem List   Diagnosis Date Noted   Abnormal CT of the head 05/30/2018   Osteoarthritis of left knee 09/14/2017   It band syndrome, right 12/30/2016   Knee mass, left 12/09/2016   Obesity (BMI 30-39.9) 08/06/2016   Gastroesophageal reflux disease 04/26/2016   Midline low back pain without sciatica 08/09/2014   Dyslipidemia 04/24/2013   HTN (hypertension) 12/15/2012     Current Outpatient Medications on File Prior to Visit  Medication Sig Dispense Refill   amLODipine (NORVASC) 10 MG tablet Take 1 tablet (10 mg total) by mouth daily. 90 tablet 1   aspirin EC 81 MG tablet Take 81 mg by mouth daily. Swallow whole.     atorvastatin (LIPITOR) 20 MG tablet Take 1 tablet (20 mg total) by mouth daily. 90 tablet 3   cyclobenzaprine (FLEXERIL) 10 MG tablet Take 1 tablet (10 mg total) by mouth 2 (two) times daily as needed for muscle spasms. 20 tablet 0   hydrochlorothiazide (HYDRODIURIL) 25 MG tablet Take 1 tablet (25 mg total) by mouth daily. 90 tablet 1   Ibuprofen 200  MG CAPS Take 200 mg by mouth as needed.     metoprolol succinate (TOPROL-XL) 100 MG 24 hr tablet Take 1 tablet (100 mg total) by mouth daily. 90 tablet 1   Multiple Vitamins-Minerals (CENTRUM SILVER 50+MEN PO) Take 1 tablet by mouth daily in the afternoon.     naproxen (NAPROSYN) 375 MG tablet Take 1 tablet (375 mg total) by mouth 2 (two) times daily. 20 tablet 0   traMADol (ULTRAM) 50 MG tablet Take 1 tablet (50 mg total) by mouth every 12 (twelve) hours as needed. 60 tablet 0   No current facility-administered medications on file prior to visit.    No Known Allergies  Social History   Socioeconomic History   Marital status: Married    Spouse name: Not on file   Number of children: Not on file   Years of education: Not on file   Highest education level: Not on file  Occupational History   Not on file  Tobacco Use   Smoking status: Never   Smokeless tobacco: Never  Vaping Use   Vaping status: Never Used  Substance and Sexual Activity   Alcohol use: No   Drug use: No   Sexual activity: Yes    Birth control/protection: Condom  Other Topics Concern   Not on file  Social History Narrative   Not on file  Social Determinants of Health   Financial Resource Strain: Not on file  Food Insecurity: Not on file  Transportation Needs: Not on file  Physical Activity: Not on file  Stress: Not on file  Social Connections: Not on file  Intimate Partner Violence: Not on file    Family History  Problem Relation Age of Onset   Diabetes Mother    Hypertension Mother    Diabetes Father    Hypertension Father     Past Surgical History:  Procedure Laterality Date   COLONOSCOPY WITH PROPOFOL N/A 02/11/2015   Procedure: COLONOSCOPY WITH PROPOFOL;  Surgeon: Charolett Bumpers, MD;  Location: [facility];  Service: Endoscopy;  Laterality: N/A;    ROS: Review of Systems Negative except as stated above  PHYSICAL EXAM: BP 133/82 (BP Location: Left Arm, Patient Position: Sitting, Cuff  Size: Normal)   Pulse 70   Temp 98.4 F (36.9 C) (Oral)   Ht 5\' 11"  (1.803 m)   Wt 240 lb (108.9 kg)   SpO2 96%   BMI 33.47 kg/m   Physical Exam   General appearance - alert, well appearing, and in no distress Mental status - normal mood, behavior, speech, dress, motor activity, and thought processes Neck - supple, no significant adenopathy Chest - clear to auscultation, no wheezes, rales or rhonchi, symmetric air entry Heart - normal rate, regular rhythm, normal S1, S2, no murmurs, rubs, clicks or gallops Musculoskeletal -left knee: Mild to moderate joint enlargement.  He has a 3 cm soft movable nontender cyst on the lateral aspect of the knee.  Mild discomfort with passive range of motion. Extremities - peripheral pulses normal, no pedal edema, no clubbing or cyanosis     Latest Ref Rng & Units 04/09/2022   11:46 AM 03/17/2022    3:36 PM 07/10/2021    2:48 PM  CMP  Glucose 70 - 99 mg/dL 403   87   BUN 6 - 20 mg/dL 15   20   Creatinine 4.74 - 1.24 mg/dL 2.59   5.63   Sodium 875 - 145 mmol/L 137   140   Potassium 3.5 - 5.1 mmol/L 3.9   4.4   Chloride 98 - 111 mmol/L 102   96   CO2 22 - 32 mmol/L 28   28   Calcium 8.9 - 10.3 mg/dL 8.9   9.9   Total Protein 6.5 - 8.1 g/dL 7.5  7.5    Total Bilirubin 0.3 - 1.2 mg/dL 0.7  0.3    Alkaline Phos 38 - 126 U/L 45  57    AST 15 - 41 U/L 21  13    ALT 0 - 44 U/L 23  16     Lipid Panel     Component Value Date/Time   CHOL 150 07/10/2021 1448   TRIG 137 07/10/2021 1448   HDL 36 (L) 07/10/2021 1448   CHOLHDL 4.2 07/10/2021 1448   CHOLHDL 3.9 04/24/2016 1619   VLDL 33 (H) 04/24/2016 1619   LDLCALC 90 07/10/2021 1448    CBC    Component Value Date/Time   WBC 6.8 04/09/2022 1146   RBC 5.10 04/09/2022 1146   HGB 14.1 04/09/2022 1146   HGB 13.9 03/17/2022 1536   HCT 43.6 04/09/2022 1146   HCT 41.0 03/17/2022 1536   PLT 238 04/09/2022 1146   PLT 247 03/17/2022 1536   MCV 85.5 04/09/2022 1146   MCV 81 03/17/2022 1536   MCH  27.6 04/09/2022 1146   MCHC 32.3 04/09/2022  1146   RDW 14.1 04/09/2022 1146   RDW 13.2 03/17/2022 1536   LYMPHSABS 2.1 02/20/2021 1546   LYMPHSABS 2.4 12/15/2019 1531   MONOABS 1.0 02/20/2021 1546   EOSABS 0.4 02/20/2021 1546   EOSABS 0.4 12/15/2019 1531   BASOSABS 0.1 02/20/2021 1546   BASOSABS 0.1 12/15/2019 1531    ASSESSMENT AND PLAN: 1. Essential hypertension Close to goal.  Continue amlodipine, hydrochlorothiazide and metoprolol. - amLODipine (NORVASC) 10 MG tablet; Take 1 tablet (10 mg total) by mouth daily.  Dispense: 90 tablet; Refill: 1 - hydrochlorothiazide (HYDRODIURIL) 25 MG tablet; Take 1 tablet (25 mg total) by mouth daily.  Dispense: 90 tablet; Refill: 1 - metoprolol succinate (TOPROL-XL) 100 MG 24 hr tablet; Take 1 tablet (100 mg total) by mouth daily.  Dispense: 90 tablet; Refill: 1 - CBC - Comprehensive metabolic panel  2. Osteoarthritis of left knee, unspecified osteoarthritis type Patient with arthritis in the left knee.  He is on tramadol to use as needed.  He tolerates the medicine well and is functional.  No untoward side effects.  [place] controlled substance reporting system reviewed.  No aberrant behavior. - traMADol (ULTRAM) 50 MG tablet; Take 1 tablet (50 mg total) by mouth every 12 (twelve) hours as needed.  Dispense: 60 tablet; Refill: 0  3. Dyslipidemia Continue atorvastatin 20 mg daily. - Lipid panel  4. Obesity (BMI 30.0-34.9) Encourage him to continue healthy eating habits and regular exercise  5. Influenza vaccination declined      Patient was given the opportunity to ask questions.  Patient verbalized understanding of the plan and was able to repeat key elements of the plan.   This documentation was completed using Paediatric nurse.  Any transcriptional errors are unintentional.  No orders of the defined types were placed in this encounter.    Requested Prescriptions   Pending Prescriptions Disp Refills    amLODipine (NORVASC) 10 MG tablet 90 tablet 1    Sig: Take 1 tablet (10 mg total) by mouth daily.   hydrochlorothiazide (HYDRODIURIL) 25 MG tablet 90 tablet 1    Sig: Take 1 tablet (25 mg total) by mouth daily.   metoprolol succinate (TOPROL-XL) 100 MG 24 hr tablet 90 tablet 1    Sig: Take 1 tablet (100 mg total) by mouth daily.   traMADol (ULTRAM) 50 MG tablet 60 tablet 0    Sig: Take 1 tablet (50 mg total) by mouth every 12 (twelve) hours as needed.    No follow-ups on file.  [name], MD, FACP

## 2023-05-25 LAB — CBC
Hematocrit: 42.9 % (ref 37.5–51.0)
Hemoglobin: 14.2 g/dL (ref 13.0–17.7)
MCH: 27.5 pg (ref 26.6–33.0)
MCHC: 33.1 g/dL (ref 31.5–35.7)
MCV: 83 fL (ref 79–97)
Platelets: 277 10*3/uL (ref 150–450)
RBC: 5.16 x10E6/uL (ref 4.14–5.80)
RDW: 13.1 % (ref 11.6–15.4)
WBC: 9.1 10*3/uL (ref 3.4–10.8)

## 2023-05-25 LAB — COMPREHENSIVE METABOLIC PANEL
ALT: 24 [IU]/L (ref 0–44)
AST: 19 [IU]/L (ref 0–40)
Albumin: 4.6 g/dL (ref 3.8–4.9)
Alkaline Phosphatase: 62 [IU]/L (ref 44–121)
BUN/Creatinine Ratio: 19 (ref 9–20)
BUN: 14 mg/dL (ref 6–24)
Bilirubin Total: 0.3 mg/dL (ref 0.0–1.2)
CO2: 25 mmol/L (ref 20–29)
Calcium: 9.6 mg/dL (ref 8.7–10.2)
Chloride: 98 mmol/L (ref 96–106)
Creatinine, Ser: 0.73 mg/dL — ABNORMAL LOW (ref 0.76–1.27)
Globulin, Total: 2.7 g/dL (ref 1.5–4.5)
Glucose: 83 mg/dL (ref 70–99)
Potassium: 4 mmol/L (ref 3.5–5.2)
Sodium: 139 mmol/L (ref 134–144)
Total Protein: 7.3 g/dL (ref 6.0–8.5)
eGFR: 105 mL/min/{1.73_m2} (ref >59)

## 2023-05-25 LAB — LIPID PANEL
Chol/HDL Ratio: 3.4 ratio (ref 0.0–5.0)
Cholesterol, Total: 148 mg/dL (ref 100–199)
HDL: 44 mg/dL (ref >39)
LDL Chol Calc (NIH): 80 mg/dL (ref 0–99)
Triglycerides: 139 mg/dL (ref 0–149)
VLDL Cholesterol Cal: 24 mg/dL (ref 5–40)

## 2023-05-27 ENCOUNTER — Other Ambulatory Visit: Payer: Self-pay

## 2023-05-31 ENCOUNTER — Other Ambulatory Visit: Payer: Self-pay

## 2023-06-01 ENCOUNTER — Other Ambulatory Visit: Payer: Self-pay

## 2023-07-12 ENCOUNTER — Other Ambulatory Visit: Payer: Self-pay | Admitting: Internal Medicine

## 2023-07-12 DIAGNOSIS — M1712 Unilateral primary osteoarthritis, left knee: Secondary | ICD-10-CM

## 2023-07-13 ENCOUNTER — Other Ambulatory Visit: Payer: Self-pay

## 2023-07-13 MED ORDER — TRAMADOL HCL 50 MG PO TABS
50.0000 mg | ORAL_TABLET | Freq: Two times a day (BID) | ORAL | 0 refills | Status: DC | PRN
  Filled 2023-07-13: qty 60, 30d supply, fill #0

## 2023-08-24 ENCOUNTER — Other Ambulatory Visit: Payer: Self-pay

## 2023-09-08 ENCOUNTER — Other Ambulatory Visit: Payer: Self-pay

## 2023-09-08 ENCOUNTER — Other Ambulatory Visit: Payer: Self-pay | Admitting: Internal Medicine

## 2023-09-08 DIAGNOSIS — M1712 Unilateral primary osteoarthritis, left knee: Secondary | ICD-10-CM

## 2023-09-08 MED ORDER — TRAMADOL HCL 50 MG PO TABS
50.0000 mg | ORAL_TABLET | Freq: Two times a day (BID) | ORAL | 0 refills | Status: DC | PRN
  Filled 2023-09-08: qty 60, 30d supply, fill #0

## 2023-09-09 ENCOUNTER — Other Ambulatory Visit: Payer: Self-pay

## 2023-09-21 ENCOUNTER — Ambulatory Visit: Payer: Self-pay | Admitting: Internal Medicine

## 2023-09-22 ENCOUNTER — Ambulatory Visit: Payer: Medicaid Other | Admitting: Physician Assistant

## 2023-09-29 ENCOUNTER — Other Ambulatory Visit: Payer: Self-pay

## 2023-09-29 ENCOUNTER — Ambulatory Visit: Attending: Physician Assistant | Admitting: Physician Assistant

## 2023-09-29 VITALS — BP 133/76 | HR 52 | Temp 99.5°F | Resp 20 | Ht 69.0 in | Wt 239.0 lb

## 2023-09-29 DIAGNOSIS — I10 Essential (primary) hypertension: Secondary | ICD-10-CM | POA: Diagnosis not present

## 2023-09-29 DIAGNOSIS — E785 Hyperlipidemia, unspecified: Secondary | ICD-10-CM | POA: Diagnosis not present

## 2023-09-29 DIAGNOSIS — M1712 Unilateral primary osteoarthritis, left knee: Secondary | ICD-10-CM | POA: Diagnosis not present

## 2023-09-29 DIAGNOSIS — Z1211 Encounter for screening for malignant neoplasm of colon: Secondary | ICD-10-CM | POA: Diagnosis not present

## 2023-09-29 MED ORDER — HYDROCHLOROTHIAZIDE 25 MG PO TABS
25.0000 mg | ORAL_TABLET | Freq: Every day | ORAL | 1 refills | Status: DC
  Filled 2023-09-29 – 2023-11-23 (×2): qty 90, 90d supply, fill #0
  Filled 2024-02-23: qty 90, 90d supply, fill #1

## 2023-09-29 MED ORDER — AMLODIPINE BESYLATE 10 MG PO TABS
10.0000 mg | ORAL_TABLET | Freq: Every day | ORAL | 1 refills | Status: DC
  Filled 2023-09-29 – 2023-11-23 (×2): qty 90, 90d supply, fill #0
  Filled 2024-02-23: qty 90, 90d supply, fill #1

## 2023-09-29 MED ORDER — METOPROLOL SUCCINATE ER 100 MG PO TB24
100.0000 mg | ORAL_TABLET | Freq: Every day | ORAL | 1 refills | Status: DC
  Filled 2023-09-29 – 2023-11-23 (×2): qty 90, 90d supply, fill #0
  Filled 2024-02-23: qty 90, 90d supply, fill #1

## 2023-09-29 MED ORDER — ATORVASTATIN CALCIUM 20 MG PO TABS
20.0000 mg | ORAL_TABLET | Freq: Every day | ORAL | 3 refills | Status: AC
  Filled 2023-09-29 – 2023-11-23 (×2): qty 90, 90d supply, fill #0
  Filled 2024-02-23: qty 90, 90d supply, fill #1
  Filled 2024-05-22: qty 90, 90d supply, fill #2

## 2023-09-29 NOTE — Progress Notes (Signed)
 Request referrals to Cardiology an Ortho for left knee Medication refill

## 2023-09-29 NOTE — Progress Notes (Signed)
 Patient ID: Damon Peck, male   DOB: 04-02-1966, 58 y.o.   MRN: [id number]   Damon Peck, is a 58 y.o. male  EXB:[id number]  NUU:[id number]  DOB - Apr 29, 1966  Chief Complaint  Patient presents with   Hypertension   Medication Refill   referrals       Subjective:   Damon Peck is a 58 y.o. male here today for med RF.  He would also like referral for ortho for his L knee pain which seems to be worsening.  He is also  needing referral for GI bc he is due for his colonoscopy.  Last colonoscopy was at age 22.  Last labs essentially normal and 4 months ago.  He is fasting for Ramadan currently which ends this week.      No problems updated.  ALLERGIES: No Known Allergies  PAST MEDICAL HISTORY: Past Medical History:  Diagnosis Date   Hyperlipidemia 06/02/2018   Hypertension     MEDICATIONS AT HOME: Prior to Admission medications   Medication Sig Start Date End Date Taking? Authorizing Provider  aspirin EC 81 MG tablet Take 81 mg by mouth daily. Swallow whole.   Yes [provider]  cyclobenzaprine (FLEXERIL) 10 MG tablet Take 1 tablet (10 mg total) by mouth 2 (two) times daily as needed for muscle spasms. 04/09/22  Yes Linwood Dibbles, MD  traMADol (ULTRAM) 50 MG tablet Take 1 tablet (50 mg total) by mouth every 12 (twelve) hours as needed. 09/08/23  Yes Marcine Matar, MD  amLODipine (NORVASC) 10 MG tablet Take 1 tablet (10 mg total) by mouth daily. 09/29/23   Anders Simmonds, PA-C  atorvastatin (LIPITOR) 20 MG tablet Take 1 tablet (20 mg total) by mouth daily. 09/29/23   Anders Simmonds, PA-C  hydrochlorothiazide (HYDRODIURIL) 25 MG tablet Take 1 tablet (25 mg total) by mouth daily. 09/29/23   Anders Simmonds, PA-C  metoprolol succinate (TOPROL-XL) 100 MG 24 hr tablet Take 1 tablet (100 mg total) by mouth daily. 09/29/23   Anders Simmonds, PA-C  Multiple Vitamins-Minerals (CENTRUM SILVER 50+MEN PO) Take 1 tablet by mouth daily in the afternoon.    [provider]     ROS: Neg HEENT Neg resp Neg cardiac Neg GI Neg GU Neg MS Neg psych Neg neuro  Objective:   Vitals:   09/29/23 1634  BP: 133/76  Pulse: (!) 52  Resp: 20  Temp: 99.5 F (37.5 C)  TempSrc: Oral  SpO2: 98%  Weight: 239 lb (108.4 kg)  Height: 5\' 9"  (1.753 m)   Exam General appearance : Awake, alert, not in any distress. Speech Clear. Not toxic looking HEENT: Atraumatic and Normocephalic Neck: Supple, no JVD. No cervical lymphadenopathy.  Chest: Good air entry bilaterally, CTAB.  No rales/rhonchi/wheezing CVS: S1 S2 regular, no murmurs.  Extremities: B/L Lower Ext shows no edema, both legs are warm to touch Neurology: Awake alert, and oriented X 3, CN II-XII intact, Non focal Skin: No Rash  Data Review Lab Results  Component Value Date   HGBA1C 5.50 05/16/2015   HGBA1C 5.5 03/08/2014    Assessment & Plan   1. Dyslipidemia - atorvastatin (LIPITOR) 20 MG tablet; Take 1 tablet (20 mg total) by mouth daily.  Dispense: 90 tablet; Refill: 3  2. Essential hypertension Continue current regimen - amLODipine (NORVASC) 10 MG tablet; Take 1 tablet (10 mg total) by mouth daily.  Dispense: 90 tablet; Refill: 1 - hydrochlorothiazide (HYDRODIURIL) 25 MG tablet; Take 1 tablet (25 mg total) by  mouth daily.  Dispense: 90 tablet; Refill: 1 - metoprolol succinate (TOPROL-XL) 100 MG 24 hr tablet; Take 1 tablet (100 mg total) by mouth daily.  Dispense: 90 tablet; Refill: 1  3. Screening for colon cancer (Primary) - Ambulatory referral to Gastroenterology  4. Osteoarthritis of left knee, unspecified osteoarthritis type - Ambulatory referral to Orthopedic Surgery    Return in about 4 months (around 01/29/2024) for PCP for chronic conditions-Dr Laural Benes.  The patient was given clear instructions to go to ER or return to medical center if symptoms don't improve, worsen or new problems develop. The patient verbalized understanding. The patient was told to call to get lab results if  they haven't heard anything in the next week.      Georgian Co, PA-C [facility] [place], [place] [phone]   09/29/2023, 5:58 PM

## 2023-09-29 NOTE — Patient Instructions (Signed)
 64 to 80 ounces water daily

## 2023-10-12 ENCOUNTER — Ambulatory Visit: Admitting: Physician Assistant

## 2023-10-20 ENCOUNTER — Encounter: Payer: Self-pay | Admitting: Internal Medicine

## 2023-10-29 ENCOUNTER — Ambulatory Visit: Admitting: Physician Assistant

## 2023-11-01 ENCOUNTER — Telehealth: Payer: Self-pay

## 2023-11-01 NOTE — Telephone Encounter (Signed)
 Thank you for the message.  He had a normal colonoscopy in August 2016 and was recommended for a repeat in August 2026 as you point out.  So he does not need a screening colonoscopy until then and you can place a recall for August 2026.  If he has having signs or symptoms he needs an office visit.

## 2023-11-01 NOTE — Telephone Encounter (Signed)
 Hi Dr. Willy Harvest, This patient has a screening colonoscopy scheduled with you on 11/19/23, direct. Upon chart review, his last colonoscopy was done on 02/2015, screening, with normal results.  Will you please review and advise if he should wait until 2026? Thank you, Lynden Flemmer

## 2023-11-03 ENCOUNTER — Other Ambulatory Visit: Payer: Self-pay

## 2023-11-03 ENCOUNTER — Telehealth: Payer: Self-pay

## 2023-11-03 ENCOUNTER — Ambulatory Visit (INDEPENDENT_AMBULATORY_CARE_PROVIDER_SITE_OTHER): Admitting: Physician Assistant

## 2023-11-03 DIAGNOSIS — M5416 Radiculopathy, lumbar region: Secondary | ICD-10-CM

## 2023-11-03 MED ORDER — METHOCARBAMOL 750 MG PO TABS
750.0000 mg | ORAL_TABLET | Freq: Two times a day (BID) | ORAL | 0 refills | Status: DC | PRN
  Filled 2023-11-03: qty 20, 10d supply, fill #0

## 2023-11-03 MED ORDER — PREDNISONE 10 MG (21) PO TBPK
ORAL_TABLET | ORAL | 0 refills | Status: AC
  Filled 2023-11-03: qty 21, 6d supply, fill #0

## 2023-11-03 NOTE — Telephone Encounter (Signed)
 Patient needs a a referral for PT that accepts Medicaid.  Stated that the office that he was scheduled for today does not take Medicaid.  CB# [phone].  Please advise.  Thank you.

## 2023-11-03 NOTE — Telephone Encounter (Signed)
 I placed a new order for PT at [facility] location.

## 2023-11-03 NOTE — Progress Notes (Signed)
 Office Visit Note   Patient: Damon Peck           Date of Birth: 10-21-1965           MRN: [id number] Visit Date: 11/03/2023              Requested by: Lawrance Presume, MD [address] [place] [address] [place],  [place] [address] PCP: Lawrance Presume, MD   Assessment & Plan: Visit Diagnoses:  1. Radiculopathy, lumbar region     Plan: Impression is lumbar spine radiculopathy left lower extremity.  We have discussed starting patient on a steroid taper and muscle relaxer.  He is scheduled to start PT today.  He will follow-up as needed.  Call with concerns or questions.  Follow-Up Instructions: Return if symptoms worsen or fail to improve.   Orders:  No orders of the defined types were placed in this encounter.  Meds ordered this encounter  Medications   predniSONE  (STERAPRED UNI-PAK 21 TAB) 10 MG (21) TBPK tablet    Sig: Take 6 tablets (60 mg total) by mouth daily for 1 day, THEN 5 tablets (50 mg total) daily for 1 day, THEN 4 tablets (40 mg total) daily for 1 day, THEN 3 tablets (30 mg total) daily for 1 day, THEN 2 tablets (20 mg total) daily for 1 day, THEN 1 tablet (10 mg total) daily for 1 day.    Dispense:  21 tablet    Refill:  0   methocarbamol  (ROBAXIN -750) 750 MG tablet    Sig: Take 1 tablet (750 mg total) by mouth 2 (two) times daily as needed for muscle spasms.    Dispense:  20 tablet    Refill:  0      Procedures: No procedures performed   Clinical Data: No additional findings.   Subjective: Chief Complaint  Patient presents with   Left Knee - Pain    HPI patient is a pleasant 58 year old gentleman who comes in today with left low back pain radiating into the left buttock down the posterior lateral left leg and into the left foot.  Symptoms began about a month ago.  He denies any injury or change in activity.  Pain is worse when he is sleeping or lying on his left side.  He denies any weakness to the left lower extremity.  He has been taking tramadol  and  Tylenol  without relief.  He does note associated burning that runs down the entire leg at times.  No bowel or bladder change or saddle paresthesias.  He tells me he is scheduled to start physical therapy for his back today.  Review of Systems as detailed in HPI.  All others reviewed and are negative.   Objective: Vital Signs: There were no vitals taken for this visit.  Physical Exam well-developed well-nourished gentleman in no acute distress.  Alert and oriented x 3.  Ortho Exam lumbar spine exam: He does have increased pain with lumbar flexion and extension.  Markedly positive straight leg raise on the left.  No focal weakness.  He is neurovascularly intact distally.  Specialty Comments:  No specialty comments available.  Imaging: No new imaging   PMFS History: Patient Active Problem List   Diagnosis Date Noted   Abnormal CT of the head 05/30/2018   Osteoarthritis of left knee 09/14/2017   It band syndrome, right 12/30/2016   Knee mass, left 12/09/2016   Obesity (BMI 30-39.9) 08/06/2016   Gastroesophageal reflux disease 04/26/2016   Midline low back  pain without sciatica 08/09/2014   Dyslipidemia 04/24/2013   HTN (hypertension) 12/15/2012   Past Medical History:  Diagnosis Date   Hyperlipidemia 06/02/2018   Hypertension     Family History  Problem Relation Age of Onset   Diabetes Mother    Hypertension Mother    Diabetes Father    Hypertension Father     Past Surgical History:  Procedure Laterality Date   COLONOSCOPY WITH PROPOFOL  N/A 02/11/2015   Procedure: COLONOSCOPY WITH PROPOFOL ;  Surgeon: Garrett Kallman, MD;  Location: [facility];  Service: Endoscopy;  Laterality: N/A;   Social History   Occupational History   Not on file  Tobacco Use   Smoking status: Never   Smokeless tobacco: Never  Vaping Use   Vaping status: Never Used  Substance and Sexual Activity   Alcohol use: No   Drug use: No   Sexual activity: Yes    Birth control/protection: Condom

## 2023-11-04 NOTE — Telephone Encounter (Signed)
 Ok, thanks.

## 2023-11-04 NOTE — Telephone Encounter (Signed)
 Telephone call to patient, he was informed of Dr. Jadene Maxwell instructions below:  Thank you for the message.   He had a normal colonoscopy in August 2016 and was recommended for a repeat in August 2026 as you point out.   So he does not need a screening colonoscopy until then and you can place a recall for August 2026.   If he has having signs or symptoms he needs an office visit.  Patient denies having any problems/GI signs or symptoms and states he thought it was just time for a repeat colonoscopy.  PV and colonoscopy canceled and a recall was placed for 02/2025, recall # [id number].  Patient was informed if he had any changes/GI symptoms before 02/2025 to call back and he verbalized understanding. SChaplin, RN,BSN

## 2023-11-08 ENCOUNTER — Encounter

## 2023-11-15 NOTE — Therapy (Deleted)
 OUTPATIENT PHYSICAL THERAPY THORACOLUMBAR EVALUATION   Patient Name: Damon Peck MRN: [id number] DOB:[birth year], 58 y.o., male Today's Date: 11/15/2023  END OF SESSION:   Past Medical History:  Diagnosis Date   Hyperlipidemia 06/02/2018   Hypertension    Past Surgical History:  Procedure Laterality Date   COLONOSCOPY WITH PROPOFOL  N/A 02/11/2015   Procedure: COLONOSCOPY WITH PROPOFOL ;  Surgeon: Garrett Kallman, MD;  Location: [facility];  Service: Endoscopy;  Laterality: N/A;   Patient Active Problem List   Diagnosis Date Noted   Abnormal CT of the head 05/30/2018   Osteoarthritis of left knee 09/14/2017   It band syndrome, right 12/30/2016   Knee mass, left 12/09/2016   Obesity (BMI 30-39.9) 08/06/2016   Gastroesophageal reflux disease 04/26/2016   Midline low back pain without sciatica 08/09/2014   Dyslipidemia 04/24/2013   HTN (hypertension) 12/15/2012    PCP: Lawrance Presume, MD   REFERRING PROVIDER: Sandie Cross, PA-C  REFERRING DIAG: [phone] (ICD-10-CM) - Radiculopathy, lumbar region  Rationale for Evaluation and Treatment: Rehabilitation  THERAPY DIAG:  No diagnosis found.  ONSET DATE: <3 months  SUBJECTIVE:                                                                                                                                                                                           SUBJECTIVE STATEMENT: ***  PERTINENT HISTORY:  HPI patient is a pleasant 58 year old gentleman who comes in today with left low back pain radiating into the left buttock down the posterior lateral left leg and into the left foot.  Symptoms began about a month ago.  He denies any injury or change in activity.  Pain is worse when he is sleeping or lying on his left side.  He denies any weakness to the left lower extremity.  He has been taking tramadol  and Tylenol  without relief.  He does note associated burning that runs down the entire leg at times.  No bowel or  bladder change or saddle paresthesias.  He tells me he is scheduled to start physical therapy for his back today.  PAIN:  Are you having pain? {OPRCPAIN:27236}  PRECAUTIONS: None  RED FLAGS: None   WEIGHT BEARING RESTRICTIONS: No  FALLS:  Has patient fallen in last 6 months? No   OCCUPATION: ***  PLOF: Independent  PATIENT GOALS: To manage my back pain  NEXT MD VISIT: ***  OBJECTIVE:  Note: Objective measures were completed at Evaluation unless otherwise noted.  DIAGNOSTIC FINDINGS:  FINDINGS: No fracture is noted. Mild grade 1 retrolisthesis of L4-5 is noted secondary to moderate degenerative disc disease at this level. Mild degenerative  disc disease is noted at L3-4.   IMPRESSION: Multilevel degenerative disc disease as described above. No acute abnormality seen. Incidental note is made of probable small left renal calculus.     Electronically Signed   By: [name] M.D.   On: 07/08/2021 13:56  PATIENT SURVEYS:  Modified Oswestry ***   MUSCLE LENGTH: Hamstrings: Right *** deg; Left *** deg Andy Bannister test: Right *** deg; Left *** deg  POSTURE: {posture:25561}  PALPATION: ***  LUMBAR ROM:   AROM eval  Flexion   Extension   Right lateral flexion   Left lateral flexion   Right rotation   Left rotation    (Blank rows = not tested)  LOWER EXTREMITY ROM:     {AROM/PROM:27142}  Right eval Left eval  Hip flexion    Hip extension    Hip abduction    Hip adduction    Hip internal rotation    Hip external rotation    Knee flexion    Knee extension    Ankle dorsiflexion    Ankle plantarflexion    Ankle inversion    Ankle eversion     (Blank rows = not tested)  LOWER EXTREMITY MMT:    MMT Right eval Left eval  Hip flexion    Hip extension    Hip abduction    Hip adduction    Hip internal rotation    Hip external rotation    Knee flexion    Knee extension    Ankle dorsiflexion    Ankle plantarflexion    Ankle inversion     Ankle eversion     (Blank rows = not tested)  LUMBAR SPECIAL TESTS:  Straight leg raise test: {pos/neg:25243}, Slump test: {pos/neg:25243}, and FABER test: {pos/neg:25243}  FUNCTIONAL TESTS:  30 seconds chair stand test  GAIT: Distance walked: 37ft x2 Assistive device utilized: {Assistive devices:23999} Level of assistance: {Levels of assistance:24026} Comments: ***  TREATMENT DATE: ***                                                                                                                                 PATIENT EDUCATION:  Education details: Discussed eval findings, rehab rationale and POC and patient is in agreement  Person educated: Patient Education method: Explanation Education comprehension: verbalized understanding and needs further education  HOME EXERCISE PROGRAM: ***  ASSESSMENT:  CLINICAL IMPRESSION: Patient is a *** y.o. *** who was seen today for physical therapy evaluation and treatment for ***.   OBJECTIVE IMPAIRMENTS: {opptimpairments:25111}.   ACTIVITY LIMITATIONS: {activitylimitations:27494}  PERSONAL FACTORS: {Personal factors:25162} are also affecting patient's functional outcome.   REHAB POTENTIAL: Good  CLINICAL DECISION MAKING: Evolving/moderate complexity  EVALUATION COMPLEXITY: Moderate   GOALS: Goals reviewed with patient? No  SHORT TERM GOALS: Target date: 12/13/2023  Patient to demonstrate independence in HEP  Baseline: Goal status: INITIAL  2.  *** Baseline:  Goal status: INITIAL  3.  *** Baseline:  Goal  status: INITIAL  4.  *** Baseline:  Goal status: INITIAL  5.  *** Baseline:  Goal status: INITIAL  6.  *** Baseline:  Goal status: INITIAL  LONG TERM GOALS: Target date: 01/10/2024    Patient will increase 30s chair stand reps from *** to *** with/without arms to demonstrate and improved functional ability with less pain/difficulty as well as reduce fall risk.  Baseline:  Goal status: INITIAL  2.   Patient will score at least ***% on FOTO to signify clinically meaningful improvement in functional abilities.   Baseline:  Goal status: INITIAL  3.  Patient will acknowledge ***/10 pain at least once during episode of care   Baseline:  Goal status: INITIAL  4.  *** Baseline:  Goal status: INITIAL  5.  *** Baseline:  Goal status: INITIAL  6.  *** Baseline:  Goal status: INITIAL  PLAN:  PT FREQUENCY: 2x/week  PT DURATION: 6 weeks  PLANNED INTERVENTIONS: 97164- PT Re-evaluation, 97110-Therapeutic exercises, 97530- Therapeutic activity, 97112- Neuromuscular re-education, 97535- Self Care, 40981- Manual therapy, Patient/Family education, Dry Needling, Joint mobilization, and Spinal mobilization.  PLAN FOR NEXT SESSION: HEP review and update, manual techniques as appropriate, aerobic tasks, ROM and flexibility activities, strengthening and PREs, TPDN, gait and balance training as needed     Eldon Greenland, PT 11/15/2023, 3:06 PM

## 2023-11-16 ENCOUNTER — Ambulatory Visit (INDEPENDENT_AMBULATORY_CARE_PROVIDER_SITE_OTHER): Admitting: Physician Assistant

## 2023-11-16 ENCOUNTER — Ambulatory Visit: Attending: Physician Assistant

## 2023-11-16 DIAGNOSIS — M6281 Muscle weakness (generalized): Secondary | ICD-10-CM | POA: Insufficient documentation

## 2023-11-16 DIAGNOSIS — G8929 Other chronic pain: Secondary | ICD-10-CM | POA: Insufficient documentation

## 2023-11-16 DIAGNOSIS — M5416 Radiculopathy, lumbar region: Secondary | ICD-10-CM

## 2023-11-16 DIAGNOSIS — M5442 Lumbago with sciatica, left side: Secondary | ICD-10-CM | POA: Insufficient documentation

## 2023-11-16 NOTE — Progress Notes (Signed)
 Office Visit Note   Patient: Damon Peck           Date of Birth: 1966/04/11           MRN: [id number] Visit Date: 11/16/2023              Requested by: Lawrance Presume, MD [address] [place],  [place] [address] PCP: Lawrance Presume, MD   Assessment & Plan: Visit Diagnoses:  1. Radiculopathy, lumbar region     Plan: Impression is lumbar radiculopathy left lower extremity.  We have discussed continuing with physical therapy for a bit longer as his symptoms have not worsened.  He will take over-the-counter medication as needed.  He does note when he takes his muscle relaxer this does help so he will start taking this at night.  If his symptoms of not improved after 6 to 8 weeks of physical therapy or if they happen to worsen in the meantime, he will let me know we will order an MRI of the lumbar spine.  Call with concerns or questions.  Follow-Up Instructions: Return if symptoms worsen or fail to improve.   Orders:  No orders of the defined types were placed in this encounter.  No orders of the defined types were placed in this encounter.     Procedures: No procedures performed   Clinical Data: No additional findings.   Subjective: Chief Complaint  Patient presents with   Left Leg - Pain    HPI patient is a pleasant 72 gentleman who comes in today with continued left low back pain and left lower extremity radiculopathy.  He has been dealing with this for close to 2 months now.  He was seen by me a few weeks ago.  Steroid pack muscle relaxer were sent and he was referred to outpatient physical therapy.  He had his first visit yesterday.  He denies any relief following the steroid pack.  Continues to have pain radiating from the left lower back down the posterior lateral left leg and into the shin.  He has associated paresthesias.  Symptoms appear to be worse when he is sitting down watching his TV or when he is trying to sleep at night.  Review of Systems as  detailed in HPI.  All others reviewed and are negative.   Objective: Vital Signs: There were no vitals taken for this visit.  Physical Exam well-developed [place] gentleman in no acute distress.  Alert and oriented x 3.  Ortho Exam lumbar spine exam: Increased pain with lumbar flexion and extension.  Positive straight leg raise on the left.  No focal weakness.  No spinous or paraspinous tenderness.  He is neurovascularly intact distally.  Specialty Comments:  No specialty comments available.  Imaging: No new imaging   PMFS History: Patient Active Problem List   Diagnosis Date Noted   Abnormal CT of the head 05/30/2018   Osteoarthritis of left knee 09/14/2017   It band syndrome, right 12/30/2016   Knee mass, left 12/09/2016   Obesity (BMI 30-39.9) 08/06/2016   Gastroesophageal reflux disease 04/26/2016   Midline low back pain without sciatica 08/09/2014   Dyslipidemia 04/24/2013   HTN (hypertension) 12/15/2012   Past Medical History:  Diagnosis Date   Hyperlipidemia 06/02/2018   Hypertension     Family History  Problem Relation Age of Onset   Diabetes Mother    Hypertension Mother    Diabetes Father    Hypertension Father     Past  Surgical History:  Procedure Laterality Date   COLONOSCOPY WITH PROPOFOL  N/A 02/11/2015   Procedure: COLONOSCOPY WITH PROPOFOL ;  Surgeon: Garrett Kallman, MD;  Location: [facility];  Service: Endoscopy;  Laterality: N/A;   Social History   Occupational History   Not on file  Tobacco Use   Smoking status: Never   Smokeless tobacco: Never  Vaping Use   Vaping status: Never Used  Substance and Sexual Activity   Alcohol use: No   Drug use: No   Sexual activity: Yes    Birth control/protection: Condom

## 2023-11-19 ENCOUNTER — Emergency Department (HOSPITAL_COMMUNITY)

## 2023-11-19 ENCOUNTER — Other Ambulatory Visit: Payer: Self-pay

## 2023-11-19 ENCOUNTER — Emergency Department (HOSPITAL_COMMUNITY)
Admission: EM | Admit: 2023-11-19 | Discharge: 2023-11-19 | Disposition: A | Discharge status: Home / Self Care | Attending: Emergency Medicine | Admitting: Emergency Medicine

## 2023-11-19 ENCOUNTER — Encounter: Admitting: Internal Medicine

## 2023-11-19 ENCOUNTER — Encounter (HOSPITAL_COMMUNITY): Payer: Self-pay | Admitting: *Deleted

## 2023-11-19 DIAGNOSIS — Z7982 Long term (current) use of aspirin: Secondary | ICD-10-CM | POA: Diagnosis not present

## 2023-11-19 DIAGNOSIS — R0602 Shortness of breath: Secondary | ICD-10-CM | POA: Diagnosis not present

## 2023-11-19 DIAGNOSIS — R102 Pelvic and perineal pain: Secondary | ICD-10-CM | POA: Insufficient documentation

## 2023-11-19 DIAGNOSIS — R072 Precordial pain: Secondary | ICD-10-CM

## 2023-11-19 DIAGNOSIS — M79605 Pain in left leg: Secondary | ICD-10-CM | POA: Diagnosis present

## 2023-11-19 DIAGNOSIS — M79662 Pain in left lower leg: Secondary | ICD-10-CM | POA: Diagnosis not present

## 2023-11-19 DIAGNOSIS — M5432 Sciatica, left side: Secondary | ICD-10-CM

## 2023-11-19 LAB — URINALYSIS, W/ REFLEX TO CULTURE (INFECTION SUSPECTED)
Bacteria, UA: NONE SEEN
Bilirubin Urine: NEGATIVE
Glucose, UA: NEGATIVE mg/dL
Hgb urine dipstick: NEGATIVE
Ketones, ur: NEGATIVE mg/dL
Leukocytes,Ua: NEGATIVE
Nitrite: NEGATIVE
Protein, ur: NEGATIVE mg/dL
Specific Gravity, Urine: 1.011 (ref 1.005–1.030)
pH: 6 (ref 5.0–8.0)

## 2023-11-19 LAB — COMPREHENSIVE METABOLIC PANEL WITH GFR
ALT: 25 U/L (ref 0–44)
AST: 21 U/L (ref 15–41)
Albumin: 4.2 g/dL (ref 3.5–5.0)
Alkaline Phosphatase: 44 U/L (ref 38–126)
Anion gap: 10 (ref 5–15)
BUN: 16 mg/dL (ref 6–20)
CO2: 27 mmol/L (ref 22–32)
Calcium: 9.2 mg/dL (ref 8.9–10.3)
Chloride: 99 mmol/L (ref 98–111)
Creatinine, Ser: 0.64 mg/dL (ref 0.61–1.24)
GFR, Estimated: >60 mL/min (ref >60)
Glucose, Bld: 138 mg/dL — ABNORMAL HIGH (ref 70–99)
Potassium: 3.5 mmol/L (ref 3.5–5.1)
Sodium: 136 mmol/L (ref 135–145)
Total Bilirubin: 0.7 mg/dL (ref 0.0–1.2)
Total Protein: 7.7 g/dL (ref 6.5–8.1)

## 2023-11-19 LAB — CBC WITH DIFFERENTIAL/PLATELET
Abs Immature Granulocytes: 0.06 10*3/uL (ref 0.00–0.07)
Basophils Absolute: 0.1 10*3/uL (ref 0.0–0.1)
Basophils Relative: 1 %
Eosinophils Absolute: 0.4 10*3/uL (ref 0.0–0.5)
Eosinophils Relative: 5 %
HCT: 44.5 % (ref 39.0–52.0)
Hemoglobin: 14.3 g/dL (ref 13.0–17.0)
Immature Granulocytes: 1 %
Lymphocytes Relative: 28 %
Lymphs Abs: 2.5 10*3/uL (ref 0.7–4.0)
MCH: 27.3 pg (ref 26.0–34.0)
MCHC: 32.1 g/dL (ref 30.0–36.0)
MCV: 84.9 fL (ref 80.0–100.0)
Monocytes Absolute: 0.8 10*3/uL (ref 0.1–1.0)
Monocytes Relative: 9 %
Neutro Abs: 5 10*3/uL (ref 1.7–7.7)
Neutrophils Relative %: 56 %
Platelets: 239 10*3/uL (ref 150–400)
RBC: 5.24 MIL/uL (ref 4.22–5.81)
RDW: 14.1 % (ref 11.5–15.5)
WBC: 8.8 10*3/uL (ref 4.0–10.5)
nRBC: 0 % (ref 0.0–0.2)

## 2023-11-19 LAB — BRAIN NATRIURETIC PEPTIDE: B Natriuretic Peptide: 23.5 pg/mL (ref 0.0–100.0)

## 2023-11-19 LAB — LIPASE, BLOOD: Lipase: 48 U/L (ref 11–51)

## 2023-11-19 LAB — TROPONIN I (HIGH SENSITIVITY): Troponin I (High Sensitivity): 5 ng/L (ref <18)

## 2023-11-19 MED ORDER — IOHEXOL 350 MG/ML SOLN
100.0000 mL | Freq: Once | INTRAVENOUS | Status: AC | PRN
  Administered 2023-11-19: 100 mL via INTRAVENOUS

## 2023-11-19 MED ORDER — PREDNISONE 10 MG (21) PO TBPK
ORAL_TABLET | Freq: Every day | ORAL | 0 refills | Status: DC
Start: 2023-11-19 — End: 2024-01-28

## 2023-11-19 MED ORDER — MORPHINE SULFATE (PF) 4 MG/ML IV SOLN
4.0000 mg | Freq: Once | INTRAVENOUS | Status: AC
  Administered 2023-11-19: 4 mg via INTRAVENOUS
  Filled 2023-11-19: qty 1

## 2023-11-19 MED ORDER — ONDANSETRON HCL 4 MG/2ML IJ SOLN
4.0000 mg | Freq: Once | INTRAMUSCULAR | Status: AC
  Administered 2023-11-19: 4 mg via INTRAVENOUS
  Filled 2023-11-19: qty 2

## 2023-11-19 MED ORDER — OXYCODONE-ACETAMINOPHEN 5-325 MG PO TABS: 1.0000 | ORAL_TABLET | Freq: Four times a day (QID) | ORAL | 0 refills | Status: DC | PRN

## 2023-11-19 MED ORDER — LIDOCAINE 5 % EX PTCH: 1.0000 | MEDICATED_PATCH | CUTANEOUS | 0 refills | Status: DC

## 2023-11-19 NOTE — ED Provider Notes (Signed)
 [place] EMERGENCY DEPARTMENT AT [facility] Provider Note   CSN: [id number] Arrival date & time: 11/19/23  1432    History  Chief Complaint  Patient presents with   Leg Pain    Damon Peck is a 58 y.o. male here for evaluation of multiple complaints.  States he has had left leg pain over the last 2 weeks.  Worse with movement and at night.  Taking tramadol  without relief.  No known injury or trauma.  He subjectively feels like his leg is swollen however denies any obvious edema, erythema or warmth.  No history of PE, DVT, recent surgery, immobilization malignancy.  He also mentions he been having left-sided chest pain which is not exertional in nature.  Occasionally is pleuritic also worse when he lays on his left side and when he palpates his chest.  No coughing.  He has had chronic shortness of breath which is unchanged.  Also notes he has some pain to his left flank and lower back which he relates to his leg pain.  No history of AAA, dissection.  No fever, bowel or bladder incontinence, saddle paresthesia, anterior abdominal pain, nausea, vomiting, diarrhea.  Has been following with orthopedics for lumbar radiculopathy over the last 3 weeks. No urinary frequency, hematuria, dysuria  HPI     Home Medications Prior to Admission medications   Medication Sig Start Date End Date Taking? Authorizing Provider  lidocaine (LIDODERM) 5 % Place 1 patch onto the skin daily. Remove & Discard patch within 12 hours or as directed by MD 11/19/23  Yes Tod Abrahamsen A, PA-C  oxyCODONE -acetaminophen  (PERCOCET/ROXICET) 5-325 MG tablet Take 1 tablet by mouth every 6 (six) hours as needed for severe pain (pain score 7-10). 11/19/23  Yes Rusty Villella A, PA-C  predniSONE  (STERAPRED UNI-PAK 21 TAB) 10 MG (21) TBPK tablet Take by mouth daily. Take 6 tabs by mouth daily  for 1 days, then 5 tabs for 1 days, then 4 tabs for 1 days, then 3 tabs for 1 days, 2 tabs for 1 days, then 1 tab by mouth daily  for 1 days 11/19/23  Yes Particia Strahm A, PA-C  amLODipine  (NORVASC ) 10 MG tablet Take 1 tablet (10 mg total) by mouth daily. 09/29/23   Hassie Lint, PA-C  aspirin  EC 81 MG tablet Take 81 mg by mouth daily. Swallow whole.    [provider]  atorvastatin  (LIPITOR) 20 MG tablet Take 1 tablet (20 mg total) by mouth daily. 09/29/23   Hassie Lint, PA-C  hydrochlorothiazide  (HYDRODIURIL ) 25 MG tablet Take 1 tablet (25 mg total) by mouth daily. 09/29/23   Hassie Lint, PA-C  methocarbamol  (ROBAXIN -750) 750 MG tablet Take 1 tablet (750 mg total) by mouth 2 (two) times daily as needed for muscle spasms. 11/03/23   Sandie Cross, PA-C  metoprolol  succinate (TOPROL -XL) 100 MG 24 hr tablet Take 1 tablet (100 mg total) by mouth daily. 09/29/23   Hassie Lint, PA-C  Multiple Vitamins-Minerals (CENTRUM SILVER 50+MEN PO) Take 1 tablet by mouth daily in the afternoon.    [provider]  traMADol  (ULTRAM ) 50 MG tablet Take 1 tablet (50 mg total) by mouth every 12 (twelve) hours as needed. 09/08/23   Lawrance Presume, MD      Allergies    Patient has no known allergies.    Review of Systems   Review of Systems  Constitutional: Negative.   HENT: Negative.    Respiratory:  Positive for shortness of  breath (chronic, nonexhertional).   Cardiovascular:  Positive for chest pain (worse with movement, palpation, pleuritic, none exhertional).  Gastrointestinal: Negative.   Genitourinary: Negative.   Musculoskeletal:  Positive for back pain. Negative for neck pain and neck stiffness.       Left leg pain  Neurological: Negative.   All other systems reviewed and are negative.   Physical Exam Updated Vital Signs BP 126/68   Pulse 65   Temp 98 F (36.7 C) (Oral)   Resp 10   Wt 108.4 kg   SpO2 95%   BMI 35.29 kg/m  Physical Exam Vitals and nursing note reviewed.  Constitutional:      General: He is not in acute distress.    Appearance: He is well-developed. He is  not ill-appearing, toxic-appearing or diaphoretic.  HENT:     Head: Normocephalic and atraumatic.     Nose: Nose normal.     Mouth/Throat:     Mouth: Mucous membranes are moist.  Eyes:     Pupils: Pupils are equal, round, and reactive to light.  Cardiovascular:     Rate and Rhythm: Normal rate and regular rhythm.     Pulses: Normal pulses.          Radial pulses are 2+ on the right side and 2+ on the left side.       Dorsalis pedis pulses are 2+ on the right side and 2+ on the left side.     Heart sounds: Normal heart sounds.  Pulmonary:     Effort: Pulmonary effort is normal. No respiratory distress.     Breath sounds: Normal breath sounds.  Abdominal:     General: Bowel sounds are normal. There is no distension.     Palpations: Abdomen is soft.     Tenderness: There is no abdominal tenderness. There is no right CVA tenderness, left CVA tenderness, guarding or rebound.     Comments: Tenderness LL flank into left SI. Anterior abd soft, non tender, no rebound or guarding  Musculoskeletal:        General: Tenderness present. No swelling, deformity or signs of injury. Normal range of motion.     Cervical back: Normal range of motion and neck supple.     Right lower leg: No edema.     Left lower leg: No edema.     Comments: Tenderness to left left SI region.  Negative SLR.  compartment soft, full range of motion without difficulty  Skin:    General: Skin is warm and dry.     Capillary Refill: Capillary refill takes less than 2 seconds.     Comments: No edema, erythema or warmth, rashes or lesions on exposed skin  Neurological:     General: No focal deficit present.     Mental Status: He is alert and oriented to person, place, and time.     Cranial Nerves: No cranial nerve deficit.     Sensory: No sensory deficit.     Motor: No weakness.     Gait: Gait normal.     ED Results / Procedures / Treatments   Labs (all labs ordered are listed, but only abnormal results are  displayed) Labs Reviewed  COMPREHENSIVE METABOLIC PANEL WITH GFR - Abnormal; Notable for the following components:      Result Value   Glucose, Bld 138 (*)    All other components within normal limits  URINALYSIS, W/ REFLEX TO CULTURE (INFECTION SUSPECTED) - Abnormal; Notable for the following components:  Color, Urine STRAW (*)    All other components within normal limits  CBC WITH DIFFERENTIAL/PLATELET  LIPASE, BLOOD  BRAIN NATRIURETIC PEPTIDE  TROPONIN I (HIGH SENSITIVITY)    EKG EKG Interpretation Date/Time:  Friday Nov 19 2023 14:40:30 EDT Ventricular Rate:  74 PR Interval:  189 QRS Duration:  99 QT Interval:  395 QTC Calculation: 439 R Axis:   -2  Text Interpretation: Sinus rhythm Confirmed by Jerald Molly [phone]) on 11/19/2023 3:18:32 PM  Radiology VAS US  LOWER EXTREMITY VENOUS (DVT) (ONLY [facility] & [facility]) Result Date: 11/19/2023  Lower Venous DVT Study Patient Name:  Damon Peck  Date of Exam:   11/19/2023 Medical Rec #: [id number]  Accession #:    [id number] Date of Birth: [birth year]   Patient Gender: M Patient Age:   40 years Exam Location:  [facility] Procedure:      VAS US  LOWER EXTREMITY VENOUS (DVT) Referring Phys: Margarine Grosshans --------------------------------------------------------------------------------  Indications: Pain, and Radiculopathy.  . Comparison Study: No previous exams Performing Technologist: Carleene Chase RVS  Examination Guidelines: A complete evaluation includes B-mode imaging, spectral Doppler, color Doppler, and power Doppler as needed of all accessible portions of each vessel. Bilateral testing is considered an integral part of a complete examination. Limited examinations for reoccurring indications may be performed as noted. The reflux portion of the exam is performed with the patient in reverse Trendelenburg.  +-----+---------------+---------+-----------+----------+--------------+ RIGHTCompressibilityPhasicitySpontaneityPropertiesThrombus  Aging +-----+---------------+---------+-----------+----------+--------------+ CFV  Full           Yes      Yes                                 +-----+---------------+---------+-----------+----------+--------------+   +---------+---------------+---------+-----------+----------+--------------+ LEFT     CompressibilityPhasicitySpontaneityPropertiesThrombus Aging +---------+---------------+---------+-----------+----------+--------------+ CFV      Full           Yes      Yes                                 +---------+---------------+---------+-----------+----------+--------------+ SFJ      Full                                                        +---------+---------------+---------+-----------+----------+--------------+ FV Prox  Full                                                        +---------+---------------+---------+-----------+----------+--------------+ FV Mid   Full                                                        +---------+---------------+---------+-----------+----------+--------------+ FV DistalFull           Yes      Yes                                 +---------+---------------+---------+-----------+----------+--------------+  PFV      Full                                                        +---------+---------------+---------+-----------+----------+--------------+ POP      Full           Yes      Yes                                 +---------+---------------+---------+-----------+----------+--------------+ PTV      Full                                                        +---------+---------------+---------+-----------+----------+--------------+ PERO     Full                                                        +---------+---------------+---------+-----------+----------+--------------+ Gastroc  Full                                                         +---------+---------------+---------+-----------+----------+--------------+    Summary: RIGHT: - No evidence of common femoral vein obstruction.   LEFT: - There is no evidence of deep vein thrombosis in the lower extremity.  - Patient had complex septated fluid collection measuring 2.5 x 2.4 x 1.7 cm in the infrapatellar fat by MRI in 2018. Isoechoic structure noted in the popliteal fossa measuring 5 x 9.2 cm in.  *See table(s) above for measurements and observations.    Preliminary    CT Angio Chest PE W and/or Wo Contrast Result Date: 11/19/2023 CLINICAL DATA:  Chest pain short of breath abdomen pain EXAM: CT ANGIOGRAPHY CHEST CT ABDOMEN AND PELVIS WITH CONTRAST TECHNIQUE: Multidetector CT imaging of the chest was performed using the standard protocol during bolus administration of intravenous contrast. Multiplanar CT image reconstructions and MIPs were obtained to evaluate the vascular anatomy. Multidetector CT imaging of the abdomen and pelvis was performed using the standard protocol during bolus administration of intravenous contrast. RADIATION DOSE REDUCTION: This exam was performed according to the departmental dose-optimization program which includes automated exposure control, adjustment of the mA and/or kV according to patient size and/or use of iterative reconstruction technique. CONTRAST:  OMNIPAQUE  IOHEXOL  350 MG/ML SOLN COMPARISON:  CT 07/15/2022, 04/09/2022, chest x-ray 11/19/2023 FINDINGS: CTA CHEST FINDINGS Cardiovascular: Satisfactory opacification of the pulmonary arteries to the segmental level. No evidence of pulmonary embolism. Nonaneurysmal aorta. Mild atherosclerosis. Coronary vascular calcification. Borderline to mild cardiomegaly. No pericardial effusion Mediastinum/Nodes: Patent trachea. No thyroid mass. No suspicious mediastinal lymph nodes. 11 mm right hilar node. Esophagus within normal limits. Lungs/Pleura: No acute airspace disease or pleural effusion. Stable scattered small  pulmonary nodules measuring up to 4 mm in the left lower lobe on series 12, image  87, no imaging follow-up is recommended Musculoskeletal: No acute or suspicious osseous abnormality. Review of the MIP images confirms the above findings. CT ABDOMEN and PELVIS FINDINGS Hepatobiliary: Gallstone. No biliary dilatation or focal hepatic abnormality Pancreas: Unremarkable. No pancreatic ductal dilatation or surrounding inflammatory changes. Spleen: Normal in size without focal abnormality. Adrenals/Urinary Tract: Adrenal glands are normal. Kidneys show no hydronephrosis. Nonobstructing small stone lower pole left kidney. Numerous subcentimeter hypodense lesions within the kidneys, too small to further characterize, no specific imaging follow-up is recommended. Bladder is unremarkable Stomach/Bowel: Stomach is within normal limits. Appendix appears normal. No evidence of bowel wall thickening, distention, or inflammatory changes. Vascular/Lymphatic: Aortic atherosclerosis. No enlarged abdominal or pelvic lymph nodes. Reproductive: Prostate is unremarkable. Other: Negative for pelvic effusion or free air. Small fat containing left inguinal hernia Musculoskeletal: No acute or suspicious osseous abnormality. Trace retrolisthesis L4 on L5 with moderate severe disc space narrowing. Review of the MIP images confirms the above findings. IMPRESSION: 1. Negative for acute pulmonary embolus or aortic dissection. No CT evidence for acute intrathoracic abnormality. 2. No CT evidence for acute intra-abdominal or pelvic abnormality. 3. Gallstone. Nonobstructing left kidney stone. 4. Aortic atherosclerosis. Aortic Atherosclerosis (ICD10-I70.0). Electronically Signed   By: Esmeralda Hedge M.D.   On: 11/19/2023 17:44   CT ABDOMEN PELVIS W CONTRAST Result Date: 11/19/2023 CLINICAL DATA:  Chest pain short of breath abdomen pain EXAM: CT ANGIOGRAPHY CHEST CT ABDOMEN AND PELVIS WITH CONTRAST TECHNIQUE: Multidetector CT imaging of the chest was  performed using the standard protocol during bolus administration of intravenous contrast. Multiplanar CT image reconstructions and MIPs were obtained to evaluate the vascular anatomy. Multidetector CT imaging of the abdomen and pelvis was performed using the standard protocol during bolus administration of intravenous contrast. RADIATION DOSE REDUCTION: This exam was performed according to the departmental dose-optimization program which includes automated exposure control, adjustment of the mA and/or kV according to patient size and/or use of iterative reconstruction technique. CONTRAST:  OMNIPAQUE  IOHEXOL  350 MG/ML SOLN COMPARISON:  CT 07/15/2022, 04/09/2022, chest x-ray 11/19/2023 FINDINGS: CTA CHEST FINDINGS Cardiovascular: Satisfactory opacification of the pulmonary arteries to the segmental level. No evidence of pulmonary embolism. Nonaneurysmal aorta. Mild atherosclerosis. Coronary vascular calcification. Borderline to mild cardiomegaly. No pericardial effusion Mediastinum/Nodes: Patent trachea. No thyroid mass. No suspicious mediastinal lymph nodes. 11 mm right hilar node. Esophagus within normal limits. Lungs/Pleura: No acute airspace disease or pleural effusion. Stable scattered small pulmonary nodules measuring up to 4 mm in the left lower lobe on series 12, image 87, no imaging follow-up is recommended Musculoskeletal: No acute or suspicious osseous abnormality. Review of the MIP images confirms the above findings. CT ABDOMEN and PELVIS FINDINGS Hepatobiliary: Gallstone. No biliary dilatation or focal hepatic abnormality Pancreas: Unremarkable. No pancreatic ductal dilatation or surrounding inflammatory changes. Spleen: Normal in size without focal abnormality. Adrenals/Urinary Tract: Adrenal glands are normal. Kidneys show no hydronephrosis. Nonobstructing small stone lower pole left kidney. Numerous subcentimeter hypodense lesions within the kidneys, too small to further characterize, no  specific imaging follow-up is recommended. Bladder is unremarkable Stomach/Bowel: Stomach is within normal limits. Appendix appears normal. No evidence of bowel wall thickening, distention, or inflammatory changes. Vascular/Lymphatic: Aortic atherosclerosis. No enlarged abdominal or pelvic lymph nodes. Reproductive: Prostate is unremarkable. Other: Negative for pelvic effusion or free air. Small fat containing left inguinal hernia Musculoskeletal: No acute or suspicious osseous abnormality. Trace retrolisthesis L4 on L5 with moderate severe disc space narrowing. Review of the MIP images confirms the above findings. IMPRESSION: 1.  Negative for acute pulmonary embolus or aortic dissection. No CT evidence for acute intrathoracic abnormality. 2. No CT evidence for acute intra-abdominal or pelvic abnormality. 3. Gallstone. Nonobstructing left kidney stone. 4. Aortic atherosclerosis. Aortic Atherosclerosis (ICD10-I70.0). Electronically Signed   By: Esmeralda Hedge M.D.   On: 11/19/2023 17:44   DG Chest Portable 1 View Result Date: 11/19/2023 CLINICAL DATA:  Shortness of breath, chest pain. EXAM: PORTABLE CHEST 1 VIEW COMPARISON:  February 20, 2021. FINDINGS: The heart size and mediastinal contours are within normal limits. Both lungs are clear. The visualized skeletal structures are unremarkable. IMPRESSION: No active disease. Electronically Signed   By: [name] M.D.   On: 11/19/2023 15:39    Procedures Procedures    Medications Ordered in ED Medications  ondansetron (ZOFRAN) injection 4 mg (4 mg Intravenous Given 11/19/23 1545)  morphine (PF) 4 MG/ML injection 4 mg (4 mg Intravenous Given 11/19/23 1545)  iohexol  (OMNIPAQUE ) 350 MG/ML injection 100 mL (100 mLs Intravenous Contrast Given 11/19/23 1709)   ED Course/ Medical Decision Making/ A&P   58 year old here for evaluation of multiple complaints.  Left-sided chest pain pleuritic, worse with movement, palpation to chest.  No secondary trauma.  No  exertional chest pain.  Does not radiate.  Occurs at random times.  No overlying skin changes.  No history of PE or DVT.  Also with left flank, lower back pain and left leg pain.  Neurovascularly intact, post SLR.  Compartments of VTE.  No ischemia, overlying skin changes however given chest pain, left leg pain as well as flank pain will workup with labs and imaging.  With pain control in the meantime.  Labs and imaging personally viewed and interpreted:  X-ray without significant abnormality Ultrasound negative for DVT CBC without leukocytosis Metabolic panel without significant abnormality Lipase 48 Troponin 5 BNP 20 CT chest without acute abnormality CT abdomen pelvis without acute abnormality  Patient reassessed.  I suspect his chest pain is likely musculoskeletal close is worse with movement, palpation.  No exertional symptoms to suggest unstable angina, ACS.  Low suspicion for dissection, pneumothorax or bacterial infectious process.  With regards to his left lower leg pain leg pain I suspect this is likely radicular in nature.  Will start him on a steroid course.  Will have him add in some Percocet to help with his pain, will have him stop his tramadol  while taking this also added lidocaine patches.  I encouraged him to follow-up with his orthopedist whom he is already following for his back pain.  Low suspicion for cauda equina, discitis, osteomyelitis, transverse myelitis, psoas abscess, septic joint, gout, hemarthrosis, VTE, ischemia, occult fracture, dislocation.  The patient has been appropriately medically screened and/or stabilized in the ED. I have low suspicion for any other emergent medical condition which would require further screening, evaluation or treatment in the ED or require inpatient management.  Patient is hemodynamically stable and in no acute distress.  Patient able to ambulate in department prior to ED.  Evaluation does not show acute pathology that would require  ongoing or additional emergent interventions while in the emergency department or further inpatient treatment.  I have discussed the diagnosis with the patient and answered all questions.  Pain is been managed while in the emergency department and patient has no further complaints prior to discharge.  Patient is comfortable with plan discussed in room and is stable for discharge at this time.  I have discussed strict return precautions for returning to the emergency department.  Patient was encouraged to follow-up with PCP/specialist refer to at discharge.                                  Medical Decision Making Amount and/or Complexity of Data Reviewed Independent Historian:     Details: Family in room External Data Reviewed: labs, radiology, ECG and notes. Labs: ordered. Decision-making details documented in ED Course. Radiology: ordered and independent interpretation performed. Decision-making details documented in ED Course. ECG/medicine tests: ordered and independent interpretation performed. Decision-making details documented in ED Course.  Risk OTC drugs. Prescription drug management. Parenteral controlled substances. Decision regarding hospitalization. Diagnosis or treatment significantly limited by social determinants of health.          Final Clinical Impression(s) / ED Diagnoses Final diagnoses:  Left leg pain  Sciatica of left side  Precordial pain    Rx / DC Orders ED Discharge Orders          Ordered    predniSONE  (STERAPRED UNI-PAK 21 TAB) 10 MG (21) TBPK tablet  Daily        11/19/23 1756    lidocaine (LIDODERM) 5 %  Every 24 hours        11/19/23 1756    oxyCODONE -acetaminophen  (PERCOCET/ROXICET) 5-325 MG tablet  Every 6 hours PRN        11/19/23 1756              Mahkayla Preece A, PA-C 11/19/23 1941    Arvilla Birmingham, MD 11/19/23 1955

## 2023-11-19 NOTE — Discharge Instructions (Addendum)
 It was a pleasure taking care of you here today  Your back and leg pain is likely due to sciatica.  I am placing on a course of steroids.  Do NOT take the tramadol  that you are taking at home I have written you for some Percocet and some lidocaine patches.  Do not take additional Tylenol  while taking this medication.  Make sure to follow back up with the orthopedist for your back and leg pain as well as your primary care provider for chest  Return for any worsening symptoms

## 2023-11-19 NOTE — ED Notes (Signed)
Pt teaching provided on medications that may cause drowsiness. Pt instructed not to drive or operate heavy machinery while taking the prescribed medication. Pt verbalized understanding.   Pt provided discharge instructions and prescription information. Pt was given the opportunity to ask questions and questions were answered.   

## 2023-11-19 NOTE — ED Notes (Signed)
 Pt states he urinated before coming into the room. Pt was informed that a urine sample was needed.

## 2023-11-19 NOTE — ED Notes (Signed)
 Pt reports the pain in his L leg starts in his lumbar area and radiates down the back of his L leg. He has seen his PCP for this and was referred to PT which he states has not helped. He denies any urinary or fecal incontinence.   In reference to the CP pt localizes it to the anterior of his L shoulder for the last week. He states is hurts when he lies on his side at night and it does not hurt otherwise. He reports being able to "crack" or manipulate his L shoulder to relieve the pain. He never has radiation of the pain, numbness, or paresthesias. He does lift weights at the gym, but he states that helps him feel better and does not reproduce the pain.   He does have an occasional cough at night.

## 2023-11-19 NOTE — ED Triage Notes (Addendum)
 Here by POV from home for L leg pain, also mentions lower back pain, CP, dizziness and sob with exertion. Onset > 1 week. Rates leg pain 9/10.No meds PTA. Alert, NAD, calm, interactive, resps e/u, speaking in clear complete sentences. Steady gait.

## 2023-11-19 NOTE — Progress Notes (Signed)
 VASCULAR LAB    Left lower extremity venous duplex has been performed.  See CV proc for preliminary results.  Relayed negative results to [facility], PA-C via secure chat  Steven Veazie, RVT 11/19/2023, 4:41 PM

## 2023-11-23 ENCOUNTER — Other Ambulatory Visit: Payer: Self-pay

## 2023-11-25 ENCOUNTER — Other Ambulatory Visit: Payer: Self-pay

## 2023-11-30 NOTE — Therapy (Signed)
 OUTPATIENT PHYSICAL THERAPY THORACOLUMBAR EVALUATION   Patient Name: Damon Peck MRN: [id number] DOB:30-Jun-1966, 58 y.o., male Today's Date: 12/01/2023  END OF SESSION:  PT End of Session - 12/01/23 1509     Visit Number 1    Number of Visits 7    Date for PT Re-Evaluation 01/21/24    Authorization Type [place] MEDICAID [employer]    Authorization Time Period 27 VL for the Calendar Year No Auth Required    Authorization - Visit Number 1    Authorization - Number of Visits 27    PT Start Time 0305    PT Stop Time 0345    PT Time Calculation (min) 40 min    Activity Tolerance Patient tolerated treatment well    Behavior During Therapy [facility] for tasks assessed/performed             Past Medical History:  Diagnosis Date   Hyperlipidemia 06/02/2018   Hypertension    Past Surgical History:  Procedure Laterality Date   COLONOSCOPY WITH PROPOFOL  N/A 02/11/2015   Procedure: COLONOSCOPY WITH PROPOFOL ;  Surgeon: Garrett Kallman, MD;  Location: [facility];  Service: Endoscopy;  Laterality: N/A;   Patient Active Problem List   Diagnosis Date Noted   Abnormal CT of the head 05/30/2018   Osteoarthritis of left knee 09/14/2017   It band syndrome, right 12/30/2016   Knee mass, left 12/09/2016   Obesity (BMI 30-39.9) 08/06/2016   Gastroesophageal reflux disease 04/26/2016   Midline low back pain without sciatica 08/09/2014   Dyslipidemia 04/24/2013   HTN (hypertension) 12/15/2012    PCP: Lawrance Presume, MD   REFERRING PROVIDER: Sandie Cross, PA-C   REFERRING DIAG: [phone] (ICD-10-CM) - Radiculopathy, lumbar region   Rationale for Evaluation and Treatment: Rehabilitation  THERAPY DIAG:  Chronic left-sided low back pain with left-sided sciatica - Plan: PT plan of care cert/re-cert  Muscle weakness (generalized) - Plan: PT plan of care cert/re-cert  ONSET DATE: 2 months  SUBJECTIVE:                                                                                                                                                                                            SUBJECTIVE STATEMENT: Insidious onset of intermittent L low back and LE pain at times to the L foot c N/T of the toes. Tramadol  is helpful with pain management.   PERTINENT HISTORY:  See PMH, High BMI  PAIN:  Are you having pain? Yes: NPRS scale: 0/10. 8/10 at night Pain location: L low back and LE pain at times to the L foot c N/T of the toes Pain  description: Sharp, intermittent Aggravating factors: Lying on L side, sitting cross legged, lifting heavy objects Relieving factors: Walking, tramadol , ice pack, lying on R side  PRECAUTIONS: None  RED FLAGS: NA   WEIGHT BEARING RESTRICTIONS: No  FALLS:  Has patient fallen in last 6 months? No  LIVING ENVIRONMENT: Lives with: lives with their family Lives in: House/apartment   OCCUPATION: Engineer, technical sales  PLOF: Independent  PATIENT GOALS: Less pain  NEXT MD VISIT: August  OBJECTIVE:  Note: Objective measures were completed at Evaluation unless otherwise noted.  DIAGNOSTIC FINDINGS:  CT abdomen pelvis c contrast Musculoskeletal: No acute or suspicious osseous abnormality. Trace retrolisthesis L4 on L5 with moderate severe disc space narrowing.  07/08/21 Lumbar Spine IMPRESSION: Multilevel degenerative disc disease as described above. No acute abnormality seen. Incidental note is made of probable small left renal calculus.  PATIENT SURVEYS:  Modified Oswestry 20/50=40%   COGNITION: Overall cognitive status: Within functional limits for tasks assessed     SENSATION: WFL  MUSCLE LENGTH: Hamstrings: Right 50 deg; Left 50 deg Thomas test: Right WNLs deg; Left WNLs deg  POSTURE: rounded shoulders and forward head  PALPATION: Not TTP  LUMBAR ROM:   AROM eval  Flexion Full; no P  Extension Full; no P  Right lateral flexion Full; no P  Left lateral flexion Full; no P  Right rotation Full; no P   Left rotation Full; no P   (Blank rows = not tested)  LOWER EXTREMITY ROM:    WFLs Active  Right eval Left eval  Hip flexion    Hip extension    Hip abduction    Hip adduction    Hip internal rotation    Hip external rotation    Knee flexion    Knee extension    Ankle dorsiflexion    Ankle plantarflexion    Ankle inversion    Ankle eversion     (Blank rows = not tested)  LOWER EXTREMITY MMT:   Myotome screen negative. Weak core MMT Right eval Left eval  Hip flexion    Hip extension    Hip abduction    Hip adduction    Hip internal rotation    Hip external rotation    Knee flexion    Knee extension    Ankle dorsiflexion    Ankle plantarflexion    Ankle inversion    Ankle eversion     (Blank rows = not tested)  LUMBAR SPECIAL TESTS:  Straight leg raise test: Negative, Slump test: Negative, and SI Compression/distraction test: Negative  FUNCTIONAL TESTS:  5 times sit to stand: TBA  GAIT: Distance walked: 200' Assistive device utilized: None Level of assistance: Complete Independence Comments: WNLs  TREATMENT DATE:  [facility] Adult PT Treatment:                                                DATE: 12/01/23 Therapeutic Exercise: Developed, instructed in, and pt completed therex as noted in HEP  PATIENT EDUCATION:  Education details: Eval findings, POC, HEP, self care- Signs and symptoms associated with degenerative changes of the lumbar spine  Person educated: Patient and Child(ren) Education method: Explanation, Demonstration, Tactile cues, Verbal cues, and Handouts Education comprehension: verbalized understanding, returned demonstration, verbal cues required, and tactile cues required  HOME EXERCISE PROGRAM: Access Code: [email or web address] URL: [email or web address] Date: 12/01/2023 Prepared by: Liborio Reeds  Exercises -  Hooklying Single Knee to Chest  - 2 x daily - 7 x weekly - 1 sets - 3 reps - 30 hold - Supine Sciatic Nerve Glide  - 2 x daily - 7 x weekly - 1 sets - 10 reps - 3 hold - Seated Flexion Stretch with Swiss Ball  - 2 x daily - 7 x weekly - 1 sets - 10 reps - 5-30 hold - Seated Thoracic Flexion and Rotation with Swiss Ball  - 2 x daily - 7 x weekly - 1 sets - 10 reps - 5-30 hold - Supine Posterior Pelvic Tilt  - 2 x daily - 7 x weekly - 2 sets - 10 reps - 3 hold - Supine Bridge  - 2 x daily - 7 x weekly - 2 sets - 10 reps - 5 hold  ASSESSMENT:  CLINICAL IMPRESSION: Patient is a 58 y.o. male who was seen today for physical therapy evaluation and treatment for M54.16 (ICD-10-CM) - Radiculopathy, lumbar region. Pt presents with L low back and L LE radicular pain which was not provoked by trunk movements today. CT reveals Trace retrolisthesis L4 on L5 with moderate severe disc space narrowing. Pt was started on a HEP for trunk mobility with flexion bias and for trunk stability. Pt will benefit from skilled PT to address impairments to optimize back function with less pain.   OBJECTIVE IMPAIRMENTS: decreased activity tolerance, decreased strength, obesity, and pain.   ACTIVITY LIMITATIONS: carrying, lifting, sitting, and sleeping  PARTICIPATION LIMITATIONS: driving and occupation  PERSONAL FACTORS: Fitness, Past/current experiences, Time since onset of injury/illness/exacerbation, and 1 comorbidity: high BMI are also affecting patient's functional outcome.   REHAB POTENTIAL: Good  CLINICAL DECISION MAKING: Evolving/moderate complexity  EVALUATION COMPLEXITY: Moderate   GOALS:  SHORT TERM GOALS= LTGS  LONG TERM GOALS: Target date: 01/21/24  Pt will be Ind in a final HEP to maintain achieved LOF  Baseline: started Goal status: INITIAL  2.  Pt will report 50% or greater improvement in his l low back and LE pain for improved function, sleep, QOL Baseline: 8/10 intermittent Goal status:  INITIAL  3.  Improve 5xSTS by MCID of 5" if measure found at a deficit to normative values Baseline: TBA Goal status: INITIAL  4.  Pt will demonstrate a forearm plank from knees as indication of improved core strength for back stability Baseline: TBA Goal status: INITIAL  5.  Pt's MODI will improve by the MCID to 28% or less as indication of improved function  Baseline:  Goal status: INITIAL  PLAN:  PT FREQUENCY: 1x/week  PT DURATION: 6 weeks  PLANNED INTERVENTIONS: 97164- PT Re-evaluation, 97110-Therapeutic exercises, 97530- Therapeutic activity, 97112- Neuromuscular re-education, 97535- Self Care, 04540- Manual therapy, [id number]- Aquatic Therapy, [id number]- Electrical stimulation (unattended), Patient/Family education, Taping, Dry Needling, Joint manipulation, Spinal mobilization, Cryotherapy, and Moist heat.  PLAN FOR NEXT SESSION: Assess 5xSTS and forearm plank from knees; assess response to HEP; progress therex as indicated; use of modalities, manual therapy; and TPDN as indicated.  Genny Caulder MS, PT 12/01/23 6:49 PM

## 2023-12-01 ENCOUNTER — Other Ambulatory Visit: Payer: Self-pay

## 2023-12-01 ENCOUNTER — Ambulatory Visit

## 2023-12-01 DIAGNOSIS — M6281 Muscle weakness (generalized): Secondary | ICD-10-CM

## 2023-12-01 DIAGNOSIS — G8929 Other chronic pain: Secondary | ICD-10-CM | POA: Diagnosis present

## 2023-12-01 DIAGNOSIS — M5442 Lumbago with sciatica, left side: Secondary | ICD-10-CM | POA: Diagnosis present

## 2023-12-08 ENCOUNTER — Telehealth: Payer: Self-pay | Admitting: Physical Therapy

## 2023-12-08 ENCOUNTER — Ambulatory Visit: Attending: Internal Medicine | Admitting: Physical Therapy

## 2023-12-08 DIAGNOSIS — G8929 Other chronic pain: Secondary | ICD-10-CM | POA: Insufficient documentation

## 2023-12-08 DIAGNOSIS — M6281 Muscle weakness (generalized): Secondary | ICD-10-CM | POA: Insufficient documentation

## 2023-12-08 DIAGNOSIS — M5442 Lumbago with sciatica, left side: Secondary | ICD-10-CM | POA: Insufficient documentation

## 2023-12-08 NOTE — Telephone Encounter (Signed)
 Contacted patient regarding missed appointment. He forgot about his appointment. Confirmed next appointment.

## 2023-12-15 ENCOUNTER — Ambulatory Visit: Admitting: Physical Therapy

## 2023-12-15 ENCOUNTER — Encounter: Payer: Self-pay | Admitting: Physical Therapy

## 2023-12-15 DIAGNOSIS — G8929 Other chronic pain: Secondary | ICD-10-CM | POA: Diagnosis present

## 2023-12-15 DIAGNOSIS — M6281 Muscle weakness (generalized): Secondary | ICD-10-CM | POA: Diagnosis not present

## 2023-12-15 DIAGNOSIS — M5416 Radiculopathy, lumbar region: Secondary | ICD-10-CM | POA: Diagnosis present

## 2023-12-15 DIAGNOSIS — M5442 Lumbago with sciatica, left side: Secondary | ICD-10-CM | POA: Diagnosis present

## 2023-12-15 NOTE — Therapy (Signed)
 OUTPATIENT PHYSICAL THERAPY THORACOLUMBAR TREATMENT   Patient Name: Damon Peck MRN: [id number] DOB:28-Jan-1966, 58 y.o., male Today's Date: 12/15/2023  END OF SESSION:  PT End of Session - 12/15/23 1104     Visit Number 2    Number of Visits 7    Date for PT Re-Evaluation 01/21/24    Authorization Type [place] MEDICAID [employer]    Authorization Time Period 27 VL for the Calendar Year No Auth Required    Authorization - Visit Number 2    Authorization - Number of Visits 27    PT Start Time 1102    PT Stop Time 1145    PT Time Calculation (min) 43 min             Past Medical History:  Diagnosis Date   Hyperlipidemia 06/02/2018   Hypertension    Past Surgical History:  Procedure Laterality Date   COLONOSCOPY WITH PROPOFOL  N/A 02/11/2015   Procedure: COLONOSCOPY WITH PROPOFOL ;  Surgeon: Garrett Kallman, MD;  Location: [facility];  Service: Endoscopy;  Laterality: N/A;   Patient Active Problem List   Diagnosis Date Noted   Abnormal CT of the head 05/30/2018   Osteoarthritis of left knee 09/14/2017   It band syndrome, right 12/30/2016   Knee mass, left 12/09/2016   Obesity (BMI 30-39.9) 08/06/2016   Gastroesophageal reflux disease 04/26/2016   Midline low back pain without sciatica 08/09/2014   Dyslipidemia 04/24/2013   HTN (hypertension) 12/15/2012    PCP: Lawrance Presume, MD   REFERRING PROVIDER: Sandie Cross, PA-C   REFERRING DIAG: [phone] (ICD-10-CM) - Radiculopathy, lumbar region   Rationale for Evaluation and Treatment: Rehabilitation  THERAPY DIAG:  Chronic left-sided low back pain with left-sided sciatica  Muscle weakness (generalized)  ONSET DATE: 2 months  SUBJECTIVE:                                                                                                                                                                                           SUBJECTIVE STATEMENT: Walking limited to 30 minutes  , sitting limited to 5-6  minutes , leg pain at night wakens   EVAL; Insidious onset of intermittent L low back and LE pain at times to the L foot c N/T of the toes. Tramadol  is helpful with pain management.   PERTINENT HISTORY:  See PMH, High BMI  PAIN:  Are you having pain? Yes: NPRS scale: 0/10. 8/10 at night Pain location: L low back and LE pain at times to the L foot c N/T of the toes Pain description: Sharp, intermittent Aggravating factors: Lying on L side, sitting cross legged,  lifting heavy objects Relieving factors: Walking, tramadol , ice pack, lying on R side  PRECAUTIONS: None  RED FLAGS: NA   WEIGHT BEARING RESTRICTIONS: No  FALLS:  Has patient fallen in last 6 months? No  LIVING ENVIRONMENT: Lives with: lives with their family Lives in: House/apartment   OCCUPATION: Engineer, technical sales  PLOF: Independent  PATIENT GOALS: Less pain  NEXT MD VISIT: August  OBJECTIVE:  Note: Objective measures were completed at Evaluation unless otherwise noted.  DIAGNOSTIC FINDINGS:  CT abdomen pelvis c contrast Musculoskeletal: No acute or suspicious osseous abnormality. Trace retrolisthesis L4 on L5 with moderate severe disc space narrowing.  07/08/21 Lumbar Spine IMPRESSION: Multilevel degenerative disc disease as described above. No acute abnormality seen. Incidental note is made of probable small left renal calculus.  PATIENT SURVEYS:  Modified Oswestry 20/50=40%   COGNITION: Overall cognitive status: Within functional limits for tasks assessed     SENSATION: WFL  MUSCLE LENGTH: Hamstrings: Right 50 deg; Left 50 deg Thomas test: Right WNLs deg; Left WNLs deg  POSTURE: rounded shoulders and forward head  PALPATION: Not TTP  LUMBAR ROM:   AROM eval  Flexion Full; no P  Extension Full; no P  Right lateral flexion Full; no P  Left lateral flexion Full; no P  Right rotation Full; no P  Left rotation Full; no P   (Blank rows = not tested)  LOWER EXTREMITY ROM:     WFLs Active  Right eval Left eval  Hip flexion    Hip extension    Hip abduction    Hip adduction    Hip internal rotation    Hip external rotation    Knee flexion    Knee extension    Ankle dorsiflexion    Ankle plantarflexion    Ankle inversion    Ankle eversion     (Blank rows = not tested)  LOWER EXTREMITY MMT:   Myotome screen negative. Weak core MMT Right eval Left eval  Hip flexion    Hip extension    Hip abduction    Hip adduction    Hip internal rotation    Hip external rotation    Knee flexion    Knee extension    Ankle dorsiflexion    Ankle plantarflexion    Ankle inversion    Ankle eversion     (Blank rows = not tested)  LUMBAR SPECIAL TESTS:  Straight leg raise test: Negative, Slump test: Negative, and SI Compression/distraction test: Negative  FUNCTIONAL TESTS:  5 times sit to stand: TBA 12/15/23: 5 x STS: 12.3 sec   GAIT: Distance walked: 200' Assistive device utilized: None Level of assistance: Complete Independence Comments: WNLs  TREATMENT DATE:  [facility] Adult PT Treatment:                                                DATE: 12/15/23 Therapeutic Exercise: Nustep L5 UE/LE x 5 minutes  Seated Figure 4 push and pull Seated hamstring stretch Seated Sciatic Tensioner  Seated lumbar flexion with ball and laterals Supine sciatic tensioner bilateral  Bridge x 10 LTR  Piriformis stretch  HL Blue band clam +HEP  HL March with Blue band - removed band  PPT  Dead Bug +HEP  Therapeutic Activity: 12.3 sec 5 x STS  20 sec plank on knees     [facility] Adult PT Treatment:  DATE: 12/01/23 Therapeutic Exercise: Developed, instructed in, and pt completed therex as noted in HEP                                                                                                                               PATIENT EDUCATION:  Education details: Eval findings, POC, HEP, self care- Signs and symptoms associated  with degenerative changes of the lumbar spine  Person educated: Patient and Child(ren) Education method: Explanation, Demonstration, Tactile cues, Verbal cues, and Handouts Education comprehension: verbalized understanding, returned demonstration, verbal cues required, and tactile cues required  HOME EXERCISE PROGRAM: Access Code: [email or web address] URL: [email or web address] Date: 12/01/2023 Prepared by: Liborio Reeds  Exercises - Hooklying Single Knee to Chest  - 2 x daily - 7 x weekly - 1 sets - 3 reps - 30 hold - Supine Sciatic Nerve Glide  - 2 x daily - 7 x weekly - 1 sets - 10 reps - 3 hold - Seated Flexion Stretch with Swiss Ball  - 2 x daily - 7 x weekly - 1 sets - 10 reps - 5-30 hold - Seated Thoracic Flexion and Rotation with Swiss Ball  - 2 x daily - 7 x weekly - 1 sets - 10 reps - 5-30 hold - Supine Posterior Pelvic Tilt  - 2 x daily - 7 x weekly - 2 sets - 10 reps - 3 hold - Supine Bridge  - 2 x daily - 7 x weekly - 2 sets - 10 reps - 5 hold 12/15/23 - Hooklying Clamshell with Resistance  - 2 x daily - 7 x weekly - 2 sets - 10 reps - 5 hold - Dead Bug  - 1 x daily - 7 x weekly - 2 sets - 10 reps   ASSESSMENT:  CLINICAL IMPRESSION: Pt reports some improvement in back and leg pain however still limited to 30 min walking and 5-6 min sitting. Reviewed HEP and captured baseline functional testing. Progressed lumbar/ hip  stabilization and updated HEP. No increased pain at end of session.    EVAL: Patient is a 58 y.o. male who was seen today for physical therapy evaluation and treatment for M54.16 (ICD-10-CM) - Radiculopathy, lumbar region. Pt presents with L low back and L LE radicular pain which was not provoked by trunk movements today. CT reveals Trace retrolisthesis L4 on L5 with moderate severe disc space narrowing. Pt was started on a HEP for trunk mobility with flexion bias and for trunk stability. Pt will benefit from skilled PT to address impairments to optimize back  function with less pain.   OBJECTIVE IMPAIRMENTS: decreased activity tolerance, decreased strength, obesity, and pain.   ACTIVITY LIMITATIONS: carrying, lifting, sitting, and sleeping  PARTICIPATION LIMITATIONS: driving and occupation  PERSONAL FACTORS: Fitness, Past/current experiences, Time since onset of injury/illness/exacerbation, and 1 comorbidity: high BMI are also affecting patient's functional outcome.   REHAB POTENTIAL: Good  CLINICAL DECISION MAKING: Evolving/moderate complexity  EVALUATION COMPLEXITY: Moderate  GOALS:  SHORT TERM GOALS= LTGS  LONG TERM GOALS: Target date: 01/21/24  Pt will be Ind in a final HEP to maintain achieved LOF  Baseline: started Goal status: INITIAL  2.  Pt will report 50% or greater improvement in his l low back and LE pain for improved function, sleep, QOL Baseline: 8/10 intermittent Goal status: INITIAL  3.  Improve 5xSTS by MCID of 5 if measure found at a deficit to normative values Baseline: TBA 12/15/23: 12.3 sec Goal status: ONGOING  4.  Pt will demonstrate a forearm plank from knees as indication of improved core strength for back stability Baseline: TBA 12/15/23: 20 sec  Goal status: ONGOING  5.  Pt's MODI will improve by the MCID to 28% or less as indication of improved function  Baseline:  Goal status: INITIAL  PLAN:  PT FREQUENCY: 1x/week  PT DURATION: 6 weeks  PLANNED INTERVENTIONS: 97164- PT Re-evaluation, 97110-Therapeutic exercises, 97530- Therapeutic activity, 97112- Neuromuscular re-education, 97535- Self Care, 16109- Manual therapy, [id number]- Aquatic Therapy, U0454- Electrical stimulation (unattended), Patient/Family education, Taping, Dry Needling, Joint manipulation, Spinal mobilization, Cryotherapy, and Moist heat.  PLAN FOR NEXT SESSION: assess response to HEP; progress therex as indicated; use of modalities, manual therapy; and TPDN as indicated.  Gasper Karst, PTA 12/15/23 12:13 PM Phone:  [phone] Fax: [phone]

## 2023-12-20 ENCOUNTER — Other Ambulatory Visit: Payer: Self-pay | Admitting: Internal Medicine

## 2023-12-20 DIAGNOSIS — M1712 Unilateral primary osteoarthritis, left knee: Secondary | ICD-10-CM

## 2023-12-21 ENCOUNTER — Other Ambulatory Visit: Payer: Self-pay

## 2023-12-21 MED ORDER — TRAMADOL HCL 50 MG PO TABS
50.0000 mg | ORAL_TABLET | Freq: Two times a day (BID) | ORAL | 0 refills | Status: DC | PRN
  Filled 2023-12-21: qty 60, 30d supply, fill #0

## 2023-12-21 NOTE — Therapy (Signed)
 OUTPATIENT PHYSICAL THERAPY THORACOLUMBAR TREATMENT   Patient Name: Damon Peck MRN: [id number] DOB:01/10/1966, 58 y.o., male Today's Date: 12/22/2023  END OF SESSION:  PT End of Session - 12/22/23 1715     Visit Number 3    Number of Visits 7    Date for PT Re-Evaluation 01/21/24    Authorization Type [place] MEDICAID [employer]    Authorization Time Period 27 VL for the Calendar Year No Auth Required    Authorization - Visit Number 3    Authorization - Number of Visits 27    PT Start Time 1421    PT Stop Time 1500    PT Time Calculation (min) 39 min    Activity Tolerance Patient tolerated treatment well    Behavior During Therapy WFL for tasks assessed/performed           Past Medical History:  Diagnosis Date   Hyperlipidemia 06/02/2018   Hypertension    Past Surgical History:  Procedure Laterality Date   COLONOSCOPY WITH PROPOFOL  N/A 02/11/2015   Procedure: COLONOSCOPY WITH PROPOFOL ;  Surgeon: Garrett Kallman, MD;  Location: [facility];  Service: Endoscopy;  Laterality: N/A;   Patient Active Problem List   Diagnosis Date Noted   Abnormal CT of the head 05/30/2018   Osteoarthritis of left knee 09/14/2017   It band syndrome, right 12/30/2016   Knee mass, left 12/09/2016   Obesity (BMI 30-39.9) 08/06/2016   Gastroesophageal reflux disease 04/26/2016   Midline low back pain without sciatica 08/09/2014   Dyslipidemia 04/24/2013   HTN (hypertension) 12/15/2012    PCP: Lawrance Presume, MD   REFERRING PROVIDER: Sandie Cross, PA-C   REFERRING DIAG: [phone] (ICD-10-CM) - Radiculopathy, lumbar region   Rationale for Evaluation and Treatment: Rehabilitation  THERAPY DIAG:  Chronic left-sided low back pain with left-sided sciatica  Muscle weakness (generalized)  ONSET DATE: 2 months  SUBJECTIVE:                                                                                                                                                                                            SUBJECTIVE STATEMENT: Exs are helping some. Still experiencing low back and L leg pain.    EVAL; Insidious onset of intermittent L low back and LE pain at times to the L foot c N/T of the toes. Tramadol  is helpful with pain management.   PERTINENT HISTORY:  See PMH, High BMI  PAIN:  Are you having pain? Yes: NPRS scale: 5/10. 8/10 at night Pain location: L low back and LE pain at times to the L foot c N/T of the toes Pain  description: Sharp, intermittent Aggravating factors: Lying on L side, sitting cross legged, lifting heavy objects Relieving factors: Walking, tramadol , ice pack, lying on R side  PRECAUTIONS: None  RED FLAGS: NA   WEIGHT BEARING RESTRICTIONS: No  FALLS:  Has patient fallen in last 6 months? No  LIVING ENVIRONMENT: Lives with: lives with their family Lives in: House/apartment   OCCUPATION: Engineer, technical sales  PLOF: Independent  PATIENT GOALS: Less pain  NEXT MD VISIT: August  OBJECTIVE:  Note: Objective measures were completed at Evaluation unless otherwise noted.  DIAGNOSTIC FINDINGS:  CT abdomen pelvis c contrast Musculoskeletal: No acute or suspicious osseous abnormality. Trace retrolisthesis L4 on L5 with moderate severe disc space narrowing.  07/08/21 Lumbar Spine IMPRESSION: Multilevel degenerative disc disease as described above. No acute abnormality seen. Incidental note is made of probable small left renal calculus.  PATIENT SURVEYS:  Modified Oswestry 20/50=40%   COGNITION: Overall cognitive status: Within functional limits for tasks assessed     SENSATION: WFL  MUSCLE LENGTH: Hamstrings: Right 50 deg; Left 50 deg Thomas test: Right WNLs deg; Left WNLs deg  POSTURE: rounded shoulders and forward head  PALPATION: Not TTP  LUMBAR ROM:   AROM eval  Flexion Full; no P  Extension Full; no P  Right lateral flexion Full; no P  Left lateral flexion Full; no P  Right rotation Full; no P   Left rotation Full; no P   (Blank rows = not tested)  LOWER EXTREMITY ROM:    WFLs Active  Right eval Left eval  Hip flexion    Hip extension    Hip abduction    Hip adduction    Hip internal rotation    Hip external rotation    Knee flexion    Knee extension    Ankle dorsiflexion    Ankle plantarflexion    Ankle inversion    Ankle eversion     (Blank rows = not tested)  LOWER EXTREMITY MMT:   Myotome screen negative. Weak core MMT Right eval Left eval  Hip flexion    Hip extension    Hip abduction    Hip adduction    Hip internal rotation    Hip external rotation    Knee flexion    Knee extension    Ankle dorsiflexion    Ankle plantarflexion    Ankle inversion    Ankle eversion     (Blank rows = not tested)  LUMBAR SPECIAL TESTS:  Straight leg raise test: Negative, Slump test: Negative, and SI Compression/distraction test: Negative  FUNCTIONAL TESTS:  5 times sit to stand: TBA 12/15/23: 5 x STS: 12.3 sec   GAIT: Distance walked: 200' Assistive device utilized: None Level of assistance: Complete Independence Comments: WNLs  TREATMENT DATE:  [facility] Adult PT Treatment:                                                DATE: 12/22/23 Therapeutic Exercise: Prone lying and press ups- decreased low back pain, but increased L LE pain. DCed R S/L QL stretch- Decreased low back and L LE apin Seated Sciatic Tensioner  Seated lumbar flexion with ball and laterals Standing R side bending x10 5- Decreased L low back and LE pain Self Care: Sleeping positions and pillow support. R SL with pillow under R hip. Suine with pillows under knees  [facility] Adult  PT Treatment:                                                DATE: 12/15/23 Therapeutic Exercise: Nustep L5 UE/LE x 5 minutes  Seated Figure 4 push and pull Seated hamstring stretch Seated Sciatic Tensioner  Seated lumbar flexion with ball and laterals Supine sciatic tensioner bilateral  Bridge x 10 LTR  Piriformis  stretch  HL Blue band clam +HEP  HL March with Blue band - removed band  PPT  Dead Bug +HEP  Therapeutic Activity: 12.3 sec 5 x STS  20 sec plank on knees   [facility] Adult PT Treatment:                                                DATE: 12/01/23 Therapeutic Exercise: Developed, instructed in, and pt completed therex as noted in HEP                                                                                                                               PATIENT EDUCATION:  Education details: Eval findings, POC, HEP, self care- Signs and symptoms associated with degenerative changes of the lumbar spine  Person educated: Patient and Child(ren) Education method: Explanation, Demonstration, Tactile cues, Verbal cues, and Handouts Education comprehension: verbalized understanding, returned demonstration, verbal cues required, and tactile cues required  HOME EXERCISE PROGRAM: Access Code: [email or web address] URL: [email or web address] Date: 12/22/2023 Prepared by: Liborio Reeds  Exercises - Hooklying Single Knee to Chest  - 2 x daily - 7 x weekly - 1 sets - 3 reps - 30 hold - Supine Sciatic Nerve Glide  - 2 x daily - 7 x weekly - 1 sets - 10 reps - 3 hold - Seated Flexion Stretch with Swiss Ball  - 2 x daily - 7 x weekly - 1 sets - 10 reps - 5-30 hold - Seated Thoracic Flexion and Rotation with Swiss Ball  - 2 x daily - 7 x weekly - 1 sets - 10 reps - 5-30 hold - Supine Posterior Pelvic Tilt  - 2 x daily - 7 x weekly - 2 sets - 10 reps - 3 hold - Supine Bridge  - 2 x daily - 7 x weekly - 2 sets - 10 reps - 5 hold - Hooklying Clamshell with Resistance  - 2 x daily - 7 x weekly - 2 sets - 10 reps - 5 hold - Dead Bug  - 1 x daily - 7 x weekly - 2 sets - 10 reps - Sidelying Quadratus Lumborum Stretch on Table (Mirrored)  - 1 x daily - 7 x weekly - 1 sets - 3 reps - 30 hold -  TL Sidebending Stretch - Single Arm Overhead (Mirrored)  - 1 x daily - 7 x weekly - 1 sets - 10 reps - 5  hold   ASSESSMENT:  CLINICAL IMPRESSION: PT was completed for preferred movement pattern for centralization of L leg pain and and a decrease in low back pain. Both the S/L QL stretch and standing R side bending minimized both the L leg and low back pain. Pt voiced understanding of these exs and of the sleeping positions with which the pt reported improved symptoms. Will reassess pt's long term response to today's session his next visit. Pt will be be going on an extended trip after the next PT session, and will provide a HEP to assist with pain reduction and management for this time away.  EVAL: Patient is a 58 y.o. male who was seen today for physical therapy evaluation and treatment for M54.16 (ICD-10-CM) - Radiculopathy, lumbar region. Pt presents with L low back and L LE radicular pain which was not provoked by trunk movements today. CT reveals Trace retrolisthesis L4 on L5 with moderate severe disc space narrowing. Pt was started on a HEP for trunk mobility with flexion bias and for trunk stability. Pt will benefit from skilled PT to address impairments to optimize back function with less pain.   OBJECTIVE IMPAIRMENTS: decreased activity tolerance, decreased strength, obesity, and pain.   ACTIVITY LIMITATIONS: carrying, lifting, sitting, and sleeping  PARTICIPATION LIMITATIONS: driving and occupation  PERSONAL FACTORS: Fitness, Past/current experiences, Time since onset of injury/illness/exacerbation, and 1 comorbidity: high BMI are also affecting patient's functional outcome.   REHAB POTENTIAL: Good  CLINICAL DECISION MAKING: Evolving/moderate complexity  EVALUATION COMPLEXITY: Moderate   GOALS:  SHORT TERM GOALS= LTGS  LONG TERM GOALS: Target date: 01/21/24  Pt will be Ind in a final HEP to maintain achieved LOF  Baseline: started Goal status: INITIAL  2.  Pt will report 50% or greater improvement in his l low back and LE pain for improved function, sleep, QOL Baseline: 8/10  intermittent Goal status: INITIAL  3.  Improve 5xSTS by MCID of 5 if measure found at a deficit to normative values Baseline: TBA 12/15/23: 12.3 sec Goal status: ONGOING  4.  Pt will demonstrate a forearm plank from knees as indication of improved core strength for back stability Baseline: TBA 12/15/23: 20 sec  Goal status: ONGOING  5.  Pt's MODI will improve by the MCID to 28% or less as indication of improved function  Baseline:  Goal status: INITIAL  PLAN:  PT FREQUENCY: 1x/week  PT DURATION: 6 weeks  PLANNED INTERVENTIONS: 97164- PT Re-evaluation, 97110-Therapeutic exercises, 97530- Therapeutic activity, 97112- Neuromuscular re-education, 97535- Self Care, 82956- Manual therapy, [id number]- Aquatic Therapy, [id number]- Electrical stimulation (unattended), Patient/Family education, Taping, Dry Needling, Joint manipulation, Spinal mobilization, Cryotherapy, and Moist heat.  PLAN FOR NEXT SESSION: assess response to HEP; progress therex as indicated; use of modalities, manual therapy; and TPDN as indicated.  Jawanna Dykman MS, PT 12/22/23 5:34 PM

## 2023-12-22 ENCOUNTER — Ambulatory Visit

## 2023-12-22 ENCOUNTER — Other Ambulatory Visit: Payer: Self-pay

## 2023-12-22 DIAGNOSIS — M5442 Lumbago with sciatica, left side: Secondary | ICD-10-CM | POA: Diagnosis not present

## 2023-12-22 DIAGNOSIS — G8929 Other chronic pain: Secondary | ICD-10-CM

## 2023-12-22 DIAGNOSIS — M6281 Muscle weakness (generalized): Secondary | ICD-10-CM

## 2023-12-28 NOTE — Therapy (Signed)
 OUTPATIENT PHYSICAL THERAPY THORACOLUMBAR TREATMENT   Patient Name: Damon Peck MRN: [id number] DOB:1966-01-16, 58 y.o., male Today's Date: 12/28/2023  END OF SESSION:     Past Medical History:  Diagnosis Date   Hyperlipidemia 06/02/2018   Hypertension    Past Surgical History:  Procedure Laterality Date   COLONOSCOPY WITH PROPOFOL  N/A 02/11/2015   Procedure: COLONOSCOPY WITH PROPOFOL ;  Surgeon: Gladis MARLA Louder, MD;  Location: [facility];  Service: Endoscopy;  Laterality: N/A;   Patient Active Problem List   Diagnosis Date Noted   Abnormal CT of the head 05/30/2018   Osteoarthritis of left knee 09/14/2017   It band syndrome, right 12/30/2016   Knee mass, left 12/09/2016   Obesity (BMI 30-39.9) 08/06/2016   Gastroesophageal reflux disease 04/26/2016   Midline low back pain without sciatica 08/09/2014   Dyslipidemia 04/24/2013   HTN (hypertension) 12/15/2012    PCP: Louder Barnie NOVAK, MD   REFERRING PROVIDER: Jule Ronal CROME, PA-C   REFERRING DIAG: [phone] (ICD-10-CM) - Radiculopathy, lumbar region   Rationale for Evaluation and Treatment: Rehabilitation  THERAPY DIAG:  No diagnosis found.  ONSET DATE: 2 months  SUBJECTIVE:                                                                                                                                                                                           SUBJECTIVE STATEMENT: Exs are helping some. Still experiencing low back and L leg pain.    EVAL; Insidious onset of intermittent L low back and LE pain at times to the L foot c N/T of the toes. Tramadol  is helpful with pain management.   PERTINENT HISTORY:  See PMH, High BMI  PAIN:  Are you having pain? Yes: NPRS scale: 5/10. 8/10 at night Pain location: L low back and LE pain at times to the L foot c N/T of the toes Pain description: Sharp, intermittent Aggravating factors: Lying on L side, sitting cross legged, lifting heavy objects Relieving factors:  Walking, tramadol , ice pack, lying on R side  PRECAUTIONS: None  RED FLAGS: NA   WEIGHT BEARING RESTRICTIONS: No  FALLS:  Has patient fallen in last 6 months? No  LIVING ENVIRONMENT: Lives with: lives with their family Lives in: House/apartment   OCCUPATION: Engineer, technical sales  PLOF: Independent  PATIENT GOALS: Less pain  NEXT MD VISIT: August  OBJECTIVE:  Note: Objective measures were completed at Evaluation unless otherwise noted.  DIAGNOSTIC FINDINGS:  CT abdomen pelvis c contrast Musculoskeletal: No acute or suspicious osseous abnormality. Trace retrolisthesis L4 on L5 with moderate severe disc space narrowing.  07/08/21 Lumbar Spine IMPRESSION: Multilevel degenerative disc disease  as described above. No acute abnormality seen. Incidental note is made of probable small left renal calculus.  PATIENT SURVEYS:  Modified Oswestry 20/50=40%   COGNITION: Overall cognitive status: Within functional limits for tasks assessed     SENSATION: WFL  MUSCLE LENGTH: Hamstrings: Right 50 deg; Left 50 deg Thomas test: Right WNLs deg; Left WNLs deg  POSTURE: rounded shoulders and forward head  PALPATION: Not TTP  LUMBAR ROM:   AROM eval  Flexion Full; no P  Extension Full; no P  Right lateral flexion Full; no P  Left lateral flexion Full; no P  Right rotation Full; no P  Left rotation Full; no P   (Blank rows = not tested)  LOWER EXTREMITY ROM:    WFLs Active  Right eval Left eval  Hip flexion    Hip extension    Hip abduction    Hip adduction    Hip internal rotation    Hip external rotation    Knee flexion    Knee extension    Ankle dorsiflexion    Ankle plantarflexion    Ankle inversion    Ankle eversion     (Blank rows = not tested)  LOWER EXTREMITY MMT:   Myotome screen negative. Weak core MMT Right eval Left eval  Hip flexion    Hip extension    Hip abduction    Hip adduction    Hip internal rotation    Hip external rotation     Knee flexion    Knee extension    Ankle dorsiflexion    Ankle plantarflexion    Ankle inversion    Ankle eversion     (Blank rows = not tested)  LUMBAR SPECIAL TESTS:  Straight leg raise test: Negative, Slump test: Negative, and SI Compression/distraction test: Negative  FUNCTIONAL TESTS:  5 times sit to stand: TBA 12/15/23: 5 x STS: 12.3 sec   GAIT: Distance walked: 200' Assistive device utilized: None Level of assistance: Complete Independence Comments: WNLs  TREATMENT DATE:  [facility] Adult PT Treatment:                                                DATE: 12/29/23   [facility] Adult PT Treatment:                                                DATE: 12/22/23 Therapeutic Exercise: Prone lying and press ups- decreased low back pain, but increased L LE pain. DCed R S/L QL stretch- Decreased low back and L LE apin Seated Sciatic Tensioner  Seated lumbar flexion with ball and laterals Standing R side bending x10 5- Decreased L low back and LE pain Self Care: Sleeping positions and pillow support. R SL with pillow under R hip. Suine with pillows under knees  PATIENT EDUCATION:  Education details: Eval findings, POC, HEP, self care- Signs and symptoms associated with degenerative changes of the lumbar spine  Person educated: Patient and Child(ren) Education method: Explanation, Demonstration, Tactile cues, Verbal cues, and Handouts Education comprehension: verbalized understanding, returned demonstration, verbal cues required, and tactile cues required  HOME EXERCISE PROGRAM: Access Code: [email or web address] URL: [email or web address] Date: 12/22/2023 Prepared by: Dasie Daft  Exercises - Hooklying Single Knee to Chest  - 2 x daily - 7 x weekly - 1 sets - 3 reps - 30 hold - Supine Sciatic Nerve Glide  - 2 x daily - 7 x weekly - 1 sets - 10 reps - 3 hold -  Seated Flexion Stretch with Swiss Ball  - 2 x daily - 7 x weekly - 1 sets - 10 reps - 5-30 hold - Seated Thoracic Flexion and Rotation with Swiss Ball  - 2 x daily - 7 x weekly - 1 sets - 10 reps - 5-30 hold - Supine Posterior Pelvic Tilt  - 2 x daily - 7 x weekly - 2 sets - 10 reps - 3 hold - Supine Bridge  - 2 x daily - 7 x weekly - 2 sets - 10 reps - 5 hold - Hooklying Clamshell with Resistance  - 2 x daily - 7 x weekly - 2 sets - 10 reps - 5 hold - Dead Bug  - 1 x daily - 7 x weekly - 2 sets - 10 reps - Sidelying Quadratus Lumborum Stretch on Table (Mirrored)  - 1 x daily - 7 x weekly - 1 sets - 3 reps - 30 hold - TL Sidebending Stretch - Single Arm Overhead (Mirrored)  - 1 x daily - 7 x weekly - 1 sets - 10 reps - 5 hold   ASSESSMENT:  CLINICAL IMPRESSION: Pt did not show for appt. Encouter created in error.  EVAL: Patient is a 58 y.o. male who was seen today for physical therapy evaluation and treatment for M54.16 (ICD-10-CM) - Radiculopathy, lumbar region. Pt presents with L low back and L LE radicular pain which was not provoked by trunk movements today. CT reveals Trace retrolisthesis L4 on L5 with moderate severe disc space narrowing. Pt was started on a HEP for trunk mobility with flexion bias and for trunk stability. Pt will benefit from skilled PT to address impairments to optimize back function with less pain.   OBJECTIVE IMPAIRMENTS: decreased activity tolerance, decreased strength, obesity, and pain.   ACTIVITY LIMITATIONS: carrying, lifting, sitting, and sleeping  PARTICIPATION LIMITATIONS: driving and occupation  PERSONAL FACTORS: Fitness, Past/current experiences, Time since onset of injury/illness/exacerbation, and 1 comorbidity: high BMI are also affecting patient's functional outcome.   REHAB POTENTIAL: Good  CLINICAL DECISION MAKING: Evolving/moderate complexity  EVALUATION COMPLEXITY: Moderate   GOALS:  SHORT TERM GOALS= LTGS  LONG TERM GOALS: Target date:  01/21/24  Pt will be Ind in a final HEP to maintain achieved LOF  Baseline: started Goal status: INITIAL  2.  Pt will report 50% or greater improvement in his l low back and LE pain for improved function, sleep, QOL Baseline: 8/10 intermittent Goal status: INITIAL  3.  Improve 5xSTS by MCID of 5 if measure found at a deficit to normative values Baseline: TBA 12/15/23: 12.3 sec Goal status: ONGOING  4.  Pt will demonstrate a forearm plank from knees as indication of improved core strength for back stability Baseline: TBA 12/15/23: 20 sec  Goal status: ONGOING  5.  Pt's MODI will  improve by the MCID to 28% or less as indication of improved function  Baseline:  Goal status: INITIAL  PLAN:  PT FREQUENCY: 1x/week  PT DURATION: 6 weeks  PLANNED INTERVENTIONS: 97164- PT Re-evaluation, 97110-Therapeutic exercises, 97530- Therapeutic activity, 97112- Neuromuscular re-education, 97535- Self Care, [id number]- Manual therapy, [id number]- Aquatic Therapy, [id number]- Electrical stimulation (unattended), Patient/Family education, Taping, Dry Needling, Joint manipulation, Spinal mobilization, Cryotherapy, and Moist heat.  PLAN FOR NEXT SESSION: assess response to HEP; progress therex as indicated; use of modalities, manual therapy; and TPDN as indicated.  Claudean Leavelle MS, PT 12/28/23 1:47 PM

## 2023-12-29 ENCOUNTER — Ambulatory Visit

## 2024-01-05 NOTE — Progress Notes (Signed)
 This encounter was created in error - please disregard.

## 2024-01-27 ENCOUNTER — Telehealth: Payer: Self-pay | Admitting: Internal Medicine

## 2024-01-27 NOTE — Telephone Encounter (Signed)
 Called pt to confirm appt for 7/25

## 2024-01-28 ENCOUNTER — Encounter: Payer: Self-pay | Admitting: Internal Medicine

## 2024-01-28 ENCOUNTER — Ambulatory Visit: Attending: Internal Medicine | Admitting: Internal Medicine

## 2024-01-28 VITALS — BP 127/79 | HR 75 | Temp 98.1°F | Ht 69.0 in | Wt 249.0 lb

## 2024-01-28 DIAGNOSIS — E785 Hyperlipidemia, unspecified: Secondary | ICD-10-CM

## 2024-01-28 DIAGNOSIS — I10 Essential (primary) hypertension: Secondary | ICD-10-CM

## 2024-01-28 DIAGNOSIS — I700 Atherosclerosis of aorta: Secondary | ICD-10-CM | POA: Diagnosis not present

## 2024-01-28 DIAGNOSIS — Z6836 Body mass index (BMI) 36.0-36.9, adult: Secondary | ICD-10-CM | POA: Diagnosis not present

## 2024-01-28 DIAGNOSIS — E66812 Obesity, class 2: Secondary | ICD-10-CM

## 2024-01-28 DIAGNOSIS — R739 Hyperglycemia, unspecified: Secondary | ICD-10-CM | POA: Diagnosis not present

## 2024-01-28 DIAGNOSIS — M1712 Unilateral primary osteoarthritis, left knee: Secondary | ICD-10-CM

## 2024-01-28 LAB — POCT GLYCOSYLATED HEMOGLOBIN (HGB A1C): HbA1c, POC (prediabetic range): 5.9 % (ref 5.7–6.4)

## 2024-01-28 LAB — GLUCOSE, POCT (MANUAL RESULT ENTRY): POC Glucose: 5.2 mg/dL — AB (ref 70–99)

## 2024-01-28 NOTE — Progress Notes (Signed)
 Patient ID: Damon Peck, male    DOB: December 07, 1965  MRN: [id number]  CC: Hypertension (HTN f/u. /No questions / concerns/No to all vax)   Subjective: Damon Peck is a 58 y.o. male who presents for chronic ds management. His concerns today include:  [name], HL, obesity, OA LT knee, COVID-19 infection 05/2019.     Discussed the use of AI scribe software for clinical note transcription with the patient, who gave verbal consent to proceed.  History of Present Illness Damon Peck is a 58 year old male with hypertension and hyperlipidemia who presents for follow-up.  HTN: He is currently on metoprolol  100 mg daily, amlodipine  10 mg daily, and hydrochlorothiazide  25 mg daily for blood pressure management. He continues to limit salt intake in his diet. No chest pain, shortness of breath, chronic headaches, or dizziness.  He reports improvement in his left knee arthritis after receiving three B complex shots while in [place] last mth, which reduced his pain by about 70%. The shots were administered in the gluteal muscle every other day. Prior to the shots, he had difficulty walking, especially at the airport, but now he is able to walk without issues. He continues to use tramadol  as needed, approximately 2-4 times a week, depending on pain levels.  HL: He is on atorvastatin  20 mg taken nightly for cholesterol management. He has gained 10 pounds since March, attributed to overeating, particularly bread and sweets, while in [place].  He was seen in the emergency room in May for chest pains.  Glucose level and chemistry was mildly elevated at 138.  He was seen in the emergency room in May for chest pain.  He underwent a recent workup including blood tests, which showed slightly elevated blood sugar levels, and imaging studies including a CT scan that revealed plaque buildup in the aorta, negative for PE and a gallbladder stone. No recent chest pain, attributing previous concerns to muscle  issues.    Patient Active Problem List   Diagnosis Date Noted   Abnormal CT of the head 05/30/2018   Osteoarthritis of left knee 09/14/2017   It band syndrome, right 12/30/2016   Knee mass, left 12/09/2016   Obesity (BMI 30-39.9) 08/06/2016   Gastroesophageal reflux disease 04/26/2016   Midline low back pain without sciatica 08/09/2014   Dyslipidemia 04/24/2013   HTN (hypertension) 12/15/2012     Current Outpatient Medications on File Prior to Visit  Medication Sig Dispense Refill   amLODipine  (NORVASC ) 10 MG tablet Take 1 tablet (10 mg total) by mouth daily. 90 tablet 1   aspirin  EC 81 MG tablet Take 81 mg by mouth daily. Swallow whole.     atorvastatin  (LIPITOR) 20 MG tablet Take 1 tablet (20 mg total) by mouth daily. 90 tablet 3   hydrochlorothiazide  (HYDRODIURIL ) 25 MG tablet Take 1 tablet (25 mg total) by mouth daily. 90 tablet 1   metoprolol  succinate (TOPROL -XL) 100 MG 24 hr tablet Take 1 tablet (100 mg total) by mouth daily. 90 tablet 1   Multiple Vitamins-Minerals (CENTRUM SILVER 50+MEN PO) Take 1 tablet by mouth daily in the afternoon.     traMADol  (ULTRAM ) 50 MG tablet Take 1 tablet (50 mg total) by mouth every 12 (twelve) hours as needed. 60 tablet 0   No current facility-administered medications on file prior to visit.    No Known Allergies  Social History   Socioeconomic History   Marital status: Married    Spouse name: Not on file  Number of children: Not on file   Years of education: Not on file   Highest education level: Not on file  Occupational History   Not on file  Tobacco Use   Smoking status: Never   Smokeless tobacco: Never  Vaping Use   Vaping status: Never Used  Substance and Sexual Activity   Alcohol use: No   Drug use: No   Sexual activity: Yes    Birth control/protection: Condom  Other Topics Concern   Not on file  Social History Narrative   Not on file   Social Drivers of Health   Financial Resource Strain: Low Risk   (01/28/2024)   Overall Financial Resource Strain (CARDIA)    Difficulty of Paying Living Expenses: Not hard at all  Food Insecurity: No Food Insecurity (01/28/2024)   Hunger Vital Sign    Worried About Running Out of Food in the Last Year: Never true    Ran Out of Food in the Last Year: Never true  Transportation Needs: No Transportation Needs (01/28/2024)   PRAPARE - Administrator, Civil Service (Medical): No    Lack of Transportation (Non-Medical): No  Physical Activity: Insufficiently Active (01/28/2024)   Exercise Vital Sign    Days of Exercise per Week: 2 days    Minutes of Exercise per Session: 30 min  Stress: No Stress Concern Present (01/28/2024)   [employer] of Occupational Health - Occupational Stress Questionnaire    Feeling of Stress: Not at all  Social Connections: Moderately Isolated (01/28/2024)   Social Connection and Isolation Panel    Frequency of Communication with Friends and Family: Twice a week    Frequency of Social Gatherings with Friends and Family: Twice a week    Attends Religious Services: Never    Database administrator or Organizations: No    Attends Banker Meetings: Never    Marital Status: Married  Catering manager Violence: Not At Risk (01/28/2024)   Humiliation, Afraid, Rape, and Kick questionnaire    Fear of Current or Ex-Partner: No    Emotionally Abused: No    Physically Abused: No    Sexually Abused: No    Family History  Problem Relation Age of Onset   Diabetes Mother    Hypertension Mother    Diabetes Father    Hypertension Father     Past Surgical History:  Procedure Laterality Date   COLONOSCOPY WITH PROPOFOL  N/A 02/11/2015   Procedure: COLONOSCOPY WITH PROPOFOL ;  Surgeon: Gladis MARLA Louder, MD;  Location: [facility];  Service: Endoscopy;  Laterality: N/A;    ROS: Review of Systems Negative except as stated above  PHYSICAL EXAM: BP 127/79 (BP Location: Left Arm, Patient Position: Sitting, Cuff  Size: Large)   Pulse 75   Temp 98.1 F (36.7 C) (Oral)   Ht 5' 9 (1.753 m)   Wt 249 lb (112.9 kg)   SpO2 95%   BMI 36.77 kg/m   Wt Readings from Last 3 Encounters:  01/28/24 249 lb (112.9 kg)  11/19/23 239 lb (108.4 kg)  09/29/23 239 lb (108.4 kg)    Physical Exam General appearance - alert, well appearing, older male and in no distress Mental status - normal mood, behavior, speech, dress, motor activity, and thought processes Neck - supple, no significant adenopathy Chest - clear to auscultation, no wheezes, rales or rhonchi, symmetric air entry Heart - normal rate, regular rhythm, normal S1, S2, no murmurs, rubs, clicks or gallops Extremities - peripheral pulses normal,  no pedal edema, no clubbing or cyanosis  Results for orders placed or performed in visit on 01/28/24  POCT glucose (manual entry)   Collection Time: 01/28/24  4:16 PM  Result Value Ref Range   POC Glucose 5.2 (A) 70 - 99 mg/dl  POCT glycosylated hemoglobin (Hb A1C)   Collection Time: 01/28/24  4:17 PM  Result Value Ref Range   Hemoglobin A1C     HbA1c POC (<> result, manual entry)     HbA1c, POC (prediabetic range) 5.9 5.7 - 6.4 %   HbA1c, POC (controlled diabetic range)         Latest Ref Rng & Units 11/19/2023    3:28 PM 05/24/2023    4:42 PM 04/09/2022   11:46 AM  CMP  Glucose 70 - 99 mg/dL 861  83  879   BUN 6 - 20 mg/dL 16  14  15    Creatinine 0.61 - 1.24 mg/dL 9.35  9.26  9.32   Sodium 135 - 145 mmol/L 136  139  137   Potassium 3.5 - 5.1 mmol/L 3.5  4.0  3.9   Chloride 98 - 111 mmol/L 99  98  102   CO2 22 - 32 mmol/L 27  25  28    Calcium  8.9 - 10.3 mg/dL 9.2  9.6  8.9   Total Protein 6.5 - 8.1 g/dL 7.7  7.3  7.5   Total Bilirubin 0.0 - 1.2 mg/dL 0.7  0.3  0.7   Alkaline Phos 38 - 126 U/L 44  62  45   AST 15 - 41 U/L 21  19  21    ALT 0 - 44 U/L 25  24  23     Lipid Panel     Component Value Date/Time   CHOL 148 05/24/2023 1642   TRIG 139 05/24/2023 1642   HDL 44 05/24/2023 1642    CHOLHDL 3.4 05/24/2023 1642   CHOLHDL 3.9 04/24/2016 1619   VLDL 33 (H) 04/24/2016 1619   LDLCALC 80 05/24/2023 1642    CBC    Component Value Date/Time   WBC 8.8 11/19/2023 1528   RBC 5.24 11/19/2023 1528   HGB 14.3 11/19/2023 1528   HGB 14.2 05/24/2023 1642   HCT 44.5 11/19/2023 1528   HCT 42.9 05/24/2023 1642   PLT 239 11/19/2023 1528   PLT 277 05/24/2023 1642   MCV 84.9 11/19/2023 1528   MCV 83 05/24/2023 1642   MCH 27.3 11/19/2023 1528   MCHC 32.1 11/19/2023 1528   RDW 14.1 11/19/2023 1528   RDW 13.1 05/24/2023 1642   LYMPHSABS 2.5 11/19/2023 1528   LYMPHSABS 2.4 12/15/2019 1531   MONOABS 0.8 11/19/2023 1528   EOSABS 0.4 11/19/2023 1528   EOSABS 0.4 12/15/2019 1531   BASOSABS 0.1 11/19/2023 1528   BASOSABS 0.1 12/15/2019 1531    ASSESSMENT AND PLAN: 1. Essential hypertension (Primary) At goal.  Continue hydrochlorothiazide  25 mg daily, Toprol  XL 100 mg daily and amlodipine  10 mg daily  2. Hyperlipidemia LDL goal <100 Continue atorvastatin  20 mg daily  3. Osteoarthritis of left knee, unspecified osteoarthritis type I am glad that he found B complex to be helpful in decreasing his pain.  However I told him that this is not an option that we have here and is currently not the standard of care for OA.  Encouraged weight loss  4. Class 2 severe obesity due to excess calories with serious comorbidity and body mass index (BMI) of 36.0 to 36.9 in adult [facility]) He has  gained 10 pounds since last visit.  Strongly encourage weight loss.  Patient states that he plans to cut out the sweets, bread and other white stuff.  He has also started going to the gym again as of this week.  5. Aortic atherosclerosis (HCC) Incidental finding on CAT scan.  Continue atorvastatin   6. Hyperglycemia A1c today in prediabetes range.  Patient informed of this.  Dietary counseling given.  He plans to restart his exercise program at the gym.  Printed information given on prediabetes. - POCT  glycosylated hemoglobin (Hb A1C) - POCT glucose (manual entry)  Patient was given the opportunity to ask questions.  Patient verbalized understanding of the plan and was able to repeat key elements of the plan.   This documentation was completed using Paediatric nurse.  Any transcriptional errors are unintentional.  Orders Placed This Encounter  Procedures   POCT glycosylated hemoglobin (Hb A1C)   POCT glucose (manual entry)     Requested Prescriptions    No prescriptions requested or ordered in this encounter    Return in about 4 months (around 05/30/2024).  Barnie Louder, MD, FACP

## 2024-01-28 NOTE — Patient Instructions (Signed)

## 2024-02-23 ENCOUNTER — Other Ambulatory Visit: Payer: Self-pay

## 2024-02-29 ENCOUNTER — Other Ambulatory Visit: Payer: Self-pay | Admitting: Internal Medicine

## 2024-02-29 DIAGNOSIS — M1712 Unilateral primary osteoarthritis, left knee: Secondary | ICD-10-CM

## 2024-03-01 ENCOUNTER — Other Ambulatory Visit: Payer: Self-pay

## 2024-03-01 NOTE — Telephone Encounter (Signed)
 Requested medication (s) are due for refill today: yes  Requested medication (s) are on the active medication list: yes  Last refill:  12/21/23 #60  Future visit scheduled: yes  Notes to clinic:  med not delegated to NT to RF   Requested Prescriptions  Pending Prescriptions Disp Refills   traMADol  (ULTRAM ) 50 MG tablet 60 tablet 0    Sig: Take 1 tablet (50 mg total) by mouth every 12 (twelve) hours as needed.     Not Delegated - Analgesics:  Opioid Agonists Failed - 03/01/2024  3:11 PM      Failed - This refill cannot be delegated      Failed - Urine Drug Screen completed in last 360 days      Passed - Valid encounter within last 3 months    Recent Outpatient Visits           1 month ago Essential hypertension   [facility] [place]. [facility] Vicci Barnie NOVAK, MD   5 months ago Screening for colon cancer   [facility] Dept Of [place]. [facility], Jon M, [place]   9 months ago Essential hypertension   [facility] Of [place]. [facility] Vic[facility] Barnie NOVAK, MD   1 year ago Essential hypertension    [facility] [facility] Dept Of [place]. [facility] Vic[facility] Barnie NOVAK, MD   1 year ago Essential hypertension    [facility] - [facility] Dept Of [place]. [facility] Vic[facility] Barnie NOVAK, MD       Future Appointments             In 3 months Vicci Barnie NOVAK, MD [facility] [place] - A Dept Of [place]. [facility], [place]

## 2024-03-02 ENCOUNTER — Other Ambulatory Visit: Payer: Self-pay

## 2024-03-02 MED ORDER — TRAMADOL HCL 50 MG PO TABS
50.0000 mg | ORAL_TABLET | Freq: Two times a day (BID) | ORAL | 0 refills | Status: DC | PRN
Start: 2024-03-02 — End: 2024-03-31
  Filled 2024-03-02: qty 60, 30d supply, fill #0

## 2024-03-07 ENCOUNTER — Other Ambulatory Visit: Payer: Self-pay

## 2024-03-30 ENCOUNTER — Telehealth: Payer: Self-pay | Admitting: Internal Medicine

## 2024-03-30 NOTE — Telephone Encounter (Signed)
 Confirmed appt for 9/26

## 2024-03-31 ENCOUNTER — Other Ambulatory Visit: Payer: Self-pay

## 2024-03-31 ENCOUNTER — Ambulatory Visit (HOSPITAL_BASED_OUTPATIENT_CLINIC_OR_DEPARTMENT_OTHER)
Admission: RE | Admit: 2024-03-31 | Discharge: 2024-03-31 | Disposition: A | Discharge status: Home / Self Care | Source: Ambulatory Visit | Attending: Internal Medicine | Admitting: Internal Medicine

## 2024-03-31 ENCOUNTER — Encounter: Payer: Self-pay | Admitting: Internal Medicine

## 2024-03-31 ENCOUNTER — Ambulatory Visit: Attending: Internal Medicine | Admitting: Internal Medicine

## 2024-03-31 VITALS — BP 134/74 | HR 72 | Temp 98.5°F | Ht 69.0 in | Wt 254.0 lb

## 2024-03-31 DIAGNOSIS — M79662 Pain in left lower leg: Secondary | ICD-10-CM | POA: Diagnosis present

## 2024-03-31 DIAGNOSIS — M7989 Other specified soft tissue disorders: Secondary | ICD-10-CM

## 2024-03-31 DIAGNOSIS — M1712 Unilateral primary osteoarthritis, left knee: Secondary | ICD-10-CM | POA: Diagnosis not present

## 2024-03-31 DIAGNOSIS — Z79899 Other long term (current) drug therapy: Secondary | ICD-10-CM

## 2024-03-31 DIAGNOSIS — I10 Essential (primary) hypertension: Secondary | ICD-10-CM | POA: Diagnosis not present

## 2024-03-31 MED ORDER — TRAMADOL HCL 50 MG PO TABS
50.0000 mg | ORAL_TABLET | Freq: Two times a day (BID) | ORAL | 0 refills | Status: AC | PRN
  Filled 2024-03-31 – 2024-05-22 (×2): qty 60, 30d supply, fill #0

## 2024-03-31 MED ORDER — AMLODIPINE BESYLATE 10 MG PO TABS
10.0000 mg | ORAL_TABLET | Freq: Every day | ORAL | 1 refills | Status: AC
  Filled 2024-03-31 – 2024-05-22 (×2): qty 90, 90d supply, fill #0

## 2024-03-31 MED ORDER — HYDROCHLOROTHIAZIDE 25 MG PO TABS
25.0000 mg | ORAL_TABLET | Freq: Every day | ORAL | 1 refills | Status: AC
  Filled 2024-03-31 – 2024-05-22 (×2): qty 90, 90d supply, fill #0

## 2024-03-31 MED ORDER — METOPROLOL SUCCINATE ER 100 MG PO TB24
100.0000 mg | ORAL_TABLET | Freq: Every day | ORAL | 1 refills | Status: AC
Start: 2024-03-31 — End: ?
  Filled 2024-03-31 – 2024-05-22 (×2): qty 90, 90d supply, fill #0

## 2024-03-31 NOTE — Patient Instructions (Signed)
  VISIT SUMMARY: You came in today because of pain and tightness in your left calf that has been bothering you for the past two weeks. The pain feels like a spasm and gets worse when you walk, but there is no visible swelling. You first noticed the symptoms after a long car ride and a beach trip where you got some mosquito bites. You have been taking Tylenol  to manage the pain.  YOUR PLAN: -LEFT CALF PAIN AND TIGHTNESS: Your left calf pain and tightness could be due to several reasons, including muscle strain or other underlying conditions. To rule out a blood clot, we will perform an ultrasound of your left leg. If the ultrasound does not show a blood clot, we will prescribe a muscle relaxant and Naprosyn  (an anti-inflammatory medication) for several days to help relieve the pain and tightness.  INSTRUCTIONS: Please schedule an ultrasound for your left leg as soon as possible. If the ultrasound is negative for a blood clot, follow the prescribed treatment with the muscle relaxant and Naprosyn . If you experience any worsening of symptoms or new symptoms, please contact our office immediately.                      Contains text generated by Abridge.                                 Contains text generated by Abridge.

## 2024-03-31 NOTE — Progress Notes (Signed)
 Patient ID: Damon Peck, male    DOB: July 04, 1966  MRN: [id number]  CC: Leg Pain (L leg pain, swelling X2 weeks/No to all vax.)   Subjective: Damon Peck is a 58 y.o. male who presents for chronic ds management. His concerns today include:  [name], HL, obesity, OA LT knee, COVID-19 infection 05/2019.     Discussed the use of AI scribe software for clinical note transcription with the patient, who gave verbal consent to proceed.  History of Present Illness Damon Peck is a 58 year old male who presents with left calf pain and tightness.  He has been experiencing a tight, pinching sensation in his left calf, particularly when walking. The pain resembles a spasm and occurs without visible swelling, although the leg feels tight. He recalls the onset of symptoms while traveling home from the beach 2 wks ago. He was a passenger in a car for a three and a half hour drive. Upon arrival in [place], he noticed his leg was swollen, but it returned to normal by the next morning. He also mentions mosquito bites on his leg during the beach trip, which he associates with the onset of tightness and swelling. He feels swelling after prolonged walking.  Symptoms worsened last week during a trip to [place] , where he walked for a couple of hours by the [place], leading to a sensation of swelling. He has been managing the pain with Tylenol  as needed. He denies any recent injury to the leg and has no personal or fhx history of blood clots.   Requesting refill on his blood pressure medications on tramadol  which he takes for osteoarthritis of the knee.    Patient Active Problem List   Diagnosis Date Noted   Abnormal CT of the head 05/30/2018   Osteoarthritis of left knee 09/14/2017   It band syndrome, right 12/30/2016   Knee mass, left 12/09/2016   Obesity (BMI 30-39.9) 08/06/2016   Gastroesophageal reflux disease 04/26/2016   Midline low back pain without sciatica 08/09/2014   Dyslipidemia  04/24/2013   HTN (hypertension) 12/15/2012     Current Outpatient Medications on File Prior to Visit  Medication Sig Dispense Refill   aspirin  EC 81 MG tablet Take 81 mg by mouth daily. Swallow whole.     atorvastatin  (LIPITOR) 20 MG tablet Take 1 tablet (20 mg total) by mouth daily. 90 tablet 3   Multiple Vitamins-Minerals (CENTRUM SILVER 50+MEN PO) Take 1 tablet by mouth daily in the afternoon.     No current facility-administered medications on file prior to visit.    No Known Allergies  Social History   Socioeconomic History   Marital status: Married    Spouse name: Not on file   Number of children: Not on file   Years of education: Not on file   Highest education level: Not on file  Occupational History   Not on file  Tobacco Use   Smoking status: Never   Smokeless tobacco: Never  Vaping Use   Vaping status: Never Used  Substance and Sexual Activity   Alcohol use: No   Drug use: No   Sexual activity: Yes    Birth control/protection: Condom  Other Topics Concern   Not on file  Social History Narrative   Not on file   Social Drivers of Health   Financial Resource Strain: Low Risk  (01/28/2024)   Overall Financial Resource Strain (CARDIA)    Difficulty of Paying Living Expenses: Not hard at  all  Food Insecurity: No Food Insecurity (01/28/2024)   Hunger Vital Sign    Worried About Running Out of Food in the Last Year: Never true    Ran Out of Food in the Last Year: Never true  Transportation Needs: No Transportation Needs (01/28/2024)   PRAPARE - Administrator, Civil Service (Medical): No    Lack of Transportation (Non-Medical): No  Physical Activity: Insufficiently Active (01/28/2024)   Exercise Vital Sign    Days of Exercise per Week: 2 days    Minutes of Exercise per Session: 30 min  Stress: No Stress Concern Present (01/28/2024)   [employer] of Occupational Health - Occupational Stress Questionnaire    Feeling of Stress: Not at all   Social Connections: Moderately Isolated (01/28/2024)   Social Connection and Isolation Panel    Frequency of Communication with Friends and Family: Twice a week    Frequency of Social Gatherings with Friends and Family: Twice a week    Attends Religious Services: Never    Database administrator or Organizations: No    Attends Banker Meetings: Never    Marital Status: Married  Catering manager Violence: Not At Risk (01/28/2024)   Humiliation, Afraid, Rape, and Kick questionnaire    Fear of Current or Ex-Partner: No    Emotionally Abused: No    Physically Abused: No    Sexually Abused: No    Family History  Problem Relation Age of Onset   Diabetes Mother    Hypertension Mother    Diabetes Father    Hypertension Father     Past Surgical History:  Procedure Laterality Date   COLONOSCOPY WITH PROPOFOL  N/A 02/11/2015   Procedure: COLONOSCOPY WITH PROPOFOL ;  Surgeon: Gladis MARLA Louder, MD;  Location: [facility];  Service: Endoscopy;  Laterality: N/A;    ROS: Review of Systems Negative except as stated above  PHYSICAL EXAM: BP 134/74 (BP Location: Left Arm, Patient Position: Sitting, Cuff Size: Large)   Pulse 72   Temp 98.5 F (36.9 C) (Oral)   Ht 5' 9 (1.753 m)   Wt 254 lb (115.2 kg)   SpO2 97%   BMI 37.51 kg/m   Physical Exam   General appearance - alert, well appearing, and in no distress Mental status - normal mood, behavior, speech, dress, motor activity, and thought processes Extremities -legs: Patient has large calves but left seems slightly larger than the right.  He has trace edema BL.  No tenderness or pain on palpation of the left calf.  [place]' sign is negative. Gait is normal.     Latest Ref Rng & Units 11/19/2023    3:28 PM 05/24/2023    4:42 PM 04/09/2022   11:46 AM  CMP  Glucose 70 - 99 mg/dL 861  83  879   BUN 6 - 20 mg/dL 16  14  15    Creatinine 0.61 - 1.24 mg/dL 9.35  9.26  9.32   Sodium 135 - 145 mmol/L 136  139  137   Potassium 3.5  - 5.1 mmol/L 3.5  4.0  3.9   Chloride 98 - 111 mmol/L 99  98  102   CO2 22 - 32 mmol/L 27  25  28    Calcium  8.9 - 10.3 mg/dL 9.2  9.6  8.9   Total Protein 6.5 - 8.1 g/dL 7.7  7.3  7.5   Total Bilirubin 0.0 - 1.2 mg/dL 0.7  0.3  0.7   Alkaline Phos 38 - 126  U/L 44  62  45   AST 15 - 41 U/L 21  19  21    ALT 0 - 44 U/L 25  24  23     Lipid Panel     Component Value Date/Time   CHOL 148 05/24/2023 1642   TRIG 139 05/24/2023 1642   HDL 44 05/24/2023 1642   CHOLHDL 3.4 05/24/2023 1642   CHOLHDL 3.9 04/24/2016 1619   VLDL 33 (H) 04/24/2016 1619   LDLCALC 80 05/24/2023 1642    CBC    Component Value Date/Time   WBC 8.8 11/19/2023 1528   RBC 5.24 11/19/2023 1528   HGB 14.3 11/19/2023 1528   HGB 14.2 05/24/2023 1642   HCT 44.5 11/19/2023 1528   HCT 42.9 05/24/2023 1642   PLT 239 11/19/2023 1528   PLT 277 05/24/2023 1642   MCV 84.9 11/19/2023 1528   MCV 83 05/24/2023 1642   MCH 27.3 11/19/2023 1528   MCHC 32.1 11/19/2023 1528   RDW 14.1 11/19/2023 1528   RDW 13.1 05/24/2023 1642   LYMPHSABS 2.5 11/19/2023 1528   LYMPHSABS 2.4 12/15/2019 1531   MONOABS 0.8 11/19/2023 1528   EOSABS 0.4 11/19/2023 1528   EOSABS 0.4 12/15/2019 1531   BASOSABS 0.1 11/19/2023 1528   BASOSABS 0.1 12/15/2019 1531    ASSESSMENT AND PLAN: 1. Pain of left calf (Primary) 2. Left leg swelling -Differential diagnoses include DVT versus muscle spasm vs partial tear gastroc muscle.  Will start by getting Doppler ultrasound to rule out DVT.  If this is negative we will try him with a muscle relaxer and anti-inflammatory for several days. - US  Venous Img Lower Unilateral Left (DVT); Future  2. Left leg swelling See #1 above - US  Venous Img Lower Unilateral Left (DVT); Future  3. Essential hypertension Refill given on amlodipine , hydrochlorothiazide  and metoprolol  - amLODipine  (NORVASC ) 10 MG tablet; Take 1 tablet (10 mg total) by mouth daily.  Dispense: 90 tablet; Refill: 1 - hydrochlorothiazide   (HYDRODIURIL ) 25 MG tablet; Take 1 tablet (25 mg total) by mouth daily.  Dispense: 90 tablet; Refill: 1 - metoprolol  succinate (TOPROL -XL) 100 MG 24 hr tablet; Take 1 tablet (100 mg total) by mouth daily.  Dispense: 90 tablet; Refill: 1  4. Osteoarthritis of left knee, unspecified osteoarthritis type [place]  controlled substance reporting system reviewed.  Refill given on tramadol . - traMADol  (ULTRAM ) 50 MG tablet; Take 1 tablet (50 mg total) by mouth every 12 (twelve) hours as needed.  Dispense: 60 tablet; Refill: 0   Patient was given the opportunity to ask questions.  Patient verbalized understanding of the plan and was able to repeat key elements of the plan.   This documentation was completed using Paediatric nurse.  Any transcriptional errors are unintentional.  Orders Placed This Encounter  Procedures   US  Venous Img Lower Unilateral Left (DVT)     Requested Prescriptions   Signed Prescriptions Disp Refills   amLODipine  (NORVASC ) 10 MG tablet 90 tablet 1    Sig: Take 1 tablet (10 mg total) by mouth daily.   hydrochlorothiazide  (HYDRODIURIL ) 25 MG tablet 90 tablet 1    Sig: Take 1 tablet (25 mg total) by mouth daily.   traMADol  (ULTRAM ) 50 MG tablet 60 tablet 0    Sig: Take 1 tablet (50 mg total) by mouth every 12 (twelve) hours as needed.   metoprolol  succinate (TOPROL -XL) 100 MG 24 hr tablet 90 tablet 1    Sig: Take 1 tablet (100 mg total) by mouth  daily.    No follow-ups on file.  Barnie Louder, MD, FACP

## 2024-04-01 ENCOUNTER — Ambulatory Visit: Payer: Self-pay | Admitting: Internal Medicine

## 2024-04-01 ENCOUNTER — Other Ambulatory Visit: Payer: Self-pay | Admitting: Internal Medicine

## 2024-04-01 MED ORDER — CYCLOBENZAPRINE HCL 5 MG PO TABS
5.0000 mg | ORAL_TABLET | Freq: Every day | ORAL | 0 refills | Status: AC | PRN
  Filled 2024-04-01: qty 15, 15d supply, fill #0

## 2024-04-01 MED ORDER — NAPROXEN 500 MG PO TABS
500.0000 mg | ORAL_TABLET | Freq: Two times a day (BID) | ORAL | 0 refills | Status: AC
  Filled 2024-04-01: qty 15, 8d supply, fill #0

## 2024-04-03 ENCOUNTER — Other Ambulatory Visit: Payer: Self-pay

## 2024-04-06 ENCOUNTER — Other Ambulatory Visit: Payer: Self-pay

## 2024-05-08 ENCOUNTER — Encounter: Payer: Self-pay | Admitting: Radiology

## 2024-05-23 ENCOUNTER — Other Ambulatory Visit: Payer: Self-pay

## 2024-05-24 ENCOUNTER — Other Ambulatory Visit: Payer: Self-pay

## 2024-06-05 ENCOUNTER — Ambulatory Visit: Attending: Internal Medicine | Admitting: Internal Medicine

## 2024-06-05 ENCOUNTER — Encounter: Payer: Self-pay | Admitting: Internal Medicine

## 2024-06-05 VITALS — BP 128/80 | HR 68 | Ht 69.0 in | Wt 250.0 lb

## 2024-06-05 DIAGNOSIS — M1712 Unilateral primary osteoarthritis, left knee: Secondary | ICD-10-CM

## 2024-06-05 DIAGNOSIS — E66812 Obesity, class 2: Secondary | ICD-10-CM | POA: Diagnosis not present

## 2024-06-05 DIAGNOSIS — I10 Essential (primary) hypertension: Secondary | ICD-10-CM

## 2024-06-05 DIAGNOSIS — E785 Hyperlipidemia, unspecified: Secondary | ICD-10-CM

## 2024-06-05 DIAGNOSIS — Z6836 Body mass index (BMI) 36.0-36.9, adult: Secondary | ICD-10-CM

## 2024-06-05 NOTE — Progress Notes (Signed)
 Patient ID: Damon Peck, male    DOB: 1966-05-30  MRN: [id number]  CC: Hypertension (HTN f/u. /No questions / concerns/No to all vax.)   Subjective: Damon Peck is a 58 y.o. male who presents for chronic ds management. His concerns today include:  [name], HL, aortic atherosclerosis, obesity, OA LT knee.    Discussed the use of AI scribe software for clinical note transcription with the patient, who gave verbal consent to proceed.  History of Present Illness Damon Peck is a 58 year old male with hypertension and hyperlipidemia who presents for a follow-up visit.  He has resolved his previous issue with a swollen leg after treatment with medication. The swelling is gone, and he no longer experiences any related problems.  He manages his hypertension with amlodipine  10 mg daily, metoprolol  XL 100 mg daily, and hydrochlorothiazide  25 mg daily. He monitors his blood pressure weekly, with consistent readings around 120/80 to 130/80 mmHg. He adheres to a low-salt diet. No chest pain, shortness of breath, chronic headaches, or dizziness.  For hyperlipidemia, he takes atorvastatin  20 mg daily at night. His last cholesterol check was in November of the previous year.  He has lost 4 pounds since his last visit, now weighing 250 pounds. He describes his eating habits as 'normal' and 'perfect,' avoiding excessive food intake and sweets. He exercises four times a week, including aerobic activities like walking and weightlifting.  His left knee arthritis continues to cause pain, though he finds some relief with tramadol  as needed. He previously received three injections in [place], which provided significant relief, but the effects have since worn off. No recent falls or swelling in the knee.    Patient Active Problem List   Diagnosis Date Noted   Abnormal CT of the head 05/30/2018   Osteoarthritis of left knee 09/14/2017   It band syndrome, right 12/30/2016   Knee mass, left  12/09/2016   Obesity (BMI 30-39.9) 08/06/2016   Gastroesophageal reflux disease 04/26/2016   Midline low back pain without sciatica 08/09/2014   Dyslipidemia 04/24/2013   HTN (hypertension) 12/15/2012     Current Outpatient Medications on File Prior to Visit  Medication Sig Dispense Refill   amLODipine  (NORVASC ) 10 MG tablet Take 1 tablet (10 mg total) by mouth daily. 90 tablet 1   aspirin  EC 81 MG tablet Take 81 mg by mouth daily. Swallow whole.     atorvastatin  (LIPITOR) 20 MG tablet Take 1 tablet (20 mg total) by mouth daily. 90 tablet 3   cyclobenzaprine  (FLEXERIL ) 5 MG tablet Take 1 tablet (5 mg total) by mouth daily as needed for muscle spasms. 15 tablet 0   hydrochlorothiazide  (HYDRODIURIL ) 25 MG tablet Take 1 tablet (25 mg total) by mouth daily. 90 tablet 1   metoprolol  succinate (TOPROL -XL) 100 MG 24 hr tablet Take 1 tablet (100 mg total) by mouth daily. 90 tablet 1   Multiple Vitamins-Minerals (CENTRUM SILVER 50+MEN PO) Take 1 tablet by mouth daily in the afternoon.     naproxen  (NAPROSYN ) 500 MG tablet Take 1 tablet (500 mg total) by mouth 2 (two) times daily with a meal. 15 tablet 0   traMADol  (ULTRAM ) 50 MG tablet Take 1 tablet (50 mg total) by mouth every 12 (twelve) hours as needed. 60 tablet 0   No current facility-administered medications on file prior to visit.    No Known Allergies  Social History   Socioeconomic History   Marital status: Married    Spouse  name: Not on file   Number of children: Not on file   Years of education: Not on file   Highest education level: Not on file  Occupational History   Not on file  Tobacco Use   Smoking status: Never   Smokeless tobacco: Never  Vaping Use   Vaping status: Never Used  Substance and Sexual Activity   Alcohol use: No   Drug use: No   Sexual activity: Yes    Birth control/protection: Condom  Other Topics Concern   Not on file  Social History Narrative   Not on file   Social Drivers of Health    Financial Resource Strain: Low Risk  (01/28/2024)   Overall Financial Resource Strain (CARDIA)    Difficulty of Paying Living Expenses: Not hard at all  Food Insecurity: No Food Insecurity (01/28/2024)   Hunger Vital Sign    Worried About Running Out of Food in the Last Year: Never true    Ran Out of Food in the Last Year: Never true  Transportation Needs: No Transportation Needs (01/28/2024)   PRAPARE - Administrator, Civil Service (Medical): No    Lack of Transportation (Non-Medical): No  Physical Activity: Insufficiently Active (01/28/2024)   Exercise Vital Sign    Days of Exercise per Week: 2 days    Minutes of Exercise per Session: 30 min  Stress: No Stress Concern Present (01/28/2024)   [employer] of Occupational Health - Occupational Stress Questionnaire    Feeling of Stress: Not at all  Social Connections: Moderately Isolated (01/28/2024)   Social Connection and Isolation Panel    Frequency of Communication with Friends and Family: Twice a week    Frequency of Social Gatherings with Friends and Family: Twice a week    Attends Religious Services: Never    Database Administrator or Organizations: No    Attends Banker Meetings: Never    Marital Status: Married  Catering Manager Violence: Not At Risk (01/28/2024)   Humiliation, Afraid, Rape, and Kick questionnaire    Fear of Current or Ex-Partner: No    Emotionally Abused: No    Physically Abused: No    Sexually Abused: No    Family History  Problem Relation Age of Onset   Diabetes Mother    Hypertension Mother    Diabetes Father    Hypertension Father     Past Surgical History:  Procedure Laterality Date   COLONOSCOPY WITH PROPOFOL  N/A 02/11/2015   Procedure: COLONOSCOPY WITH PROPOFOL ;  Surgeon: Gladis MARLA Louder, MD;  Location: [facility];  Service: Endoscopy;  Laterality: N/A;    ROS: Review of Systems Negative except as stated above  PHYSICAL EXAM: BP 128/80   Pulse 68   Ht  5' 9 (1.753 m)   Wt 250 lb (113.4 kg)   SpO2 97%   BMI 36.92 kg/m   Wt Readings from Last 3 Encounters:  06/05/24 250 lb (113.4 kg)  03/31/24 254 lb (115.2 kg)  01/28/24 249 lb (112.9 kg)    Physical Exam   General appearance - alert, well appearing, older male and in no distress Mental status - normal mood, behavior, speech, dress, motor activity, and thought processes Neck - supple, no significant adenopathy Chest - clear to auscultation, no wheezes, rales or rhonchi, symmetric air entry Heart - normal rate, regular rhythm, normal S1, S2, no murmurs, rubs, clicks or gallops Extremities - peripheral pulses normal, no pedal edema, no clubbing or cyanosis MSK: LT knee:  jt enlarged. No point tenderness. Moderate crepitus and good ROM    Latest Ref Rng & Units 11/19/2023    3:28 PM 05/24/2023    4:42 PM 04/09/2022   11:46 AM  CMP  Glucose 70 - 99 mg/dL 861  83  879   BUN 6 - 20 mg/dL 16  14  15    Creatinine 0.61 - 1.24 mg/dL 9.35  9.26  9.32   Sodium 135 - 145 mmol/L 136  139  137   Potassium 3.5 - 5.1 mmol/L 3.5  4.0  3.9   Chloride 98 - 111 mmol/L 99  98  102   CO2 22 - 32 mmol/L 27  25  28    Calcium  8.9 - 10.3 mg/dL 9.2  9.6  8.9   Total Protein 6.5 - 8.1 g/dL 7.7  7.3  7.5   Total Bilirubin 0.0 - 1.2 mg/dL 0.7  0.3  0.7   Alkaline Phos 38 - 126 U/L 44  62  45   AST 15 - 41 U/L 21  19  21    ALT 0 - 44 U/L 25  24  23     Lipid Panel     Component Value Date/Time   CHOL 148 05/24/2023 1642   TRIG 139 05/24/2023 1642   HDL 44 05/24/2023 1642   CHOLHDL 3.4 05/24/2023 1642   CHOLHDL 3.9 04/24/2016 1619   VLDL 33 (H) 04/24/2016 1619   LDLCALC 80 05/24/2023 1642    CBC    Component Value Date/Time   WBC 8.8 11/19/2023 1528   RBC 5.24 11/19/2023 1528   HGB 14.3 11/19/2023 1528   HGB 14.2 05/24/2023 1642   HCT 44.5 11/19/2023 1528   HCT 42.9 05/24/2023 1642   PLT 239 11/19/2023 1528   PLT 277 05/24/2023 1642   MCV 84.9 11/19/2023 1528   MCV 83 05/24/2023 1642    MCH 27.3 11/19/2023 1528   MCHC 32.1 11/19/2023 1528   RDW 14.1 11/19/2023 1528   RDW 13.1 05/24/2023 1642   LYMPHSABS 2.5 11/19/2023 1528   LYMPHSABS 2.4 12/15/2019 1531   MONOABS 0.8 11/19/2023 1528   EOSABS 0.4 11/19/2023 1528   EOSABS 0.4 12/15/2019 1531   BASOSABS 0.1 11/19/2023 1528   BASOSABS 0.1 12/15/2019 1531    ASSESSMENT AND PLAN: 1. Essential hypertension (Primary) At goal. Continue taking amlodipine  10 mg daily, metoprolol  XL 100 mg daily, and hydrochlorothiazide  25 mg daily. Keep up with your low-salt diet. - Comprehensive metabolic panel with GFR  2. Hyperlipidemia LDL goal <100 Continue Atorvastatin  - Lipid panel  3. Class 2 severe obesity due to excess calories with serious comorbidity and body mass index (BMI) of 36.0 to 36.9 in adult - Encouraged continued exercise and healthy eating habits.  4. Osteoarthritis of left knee, unspecified osteoarthritis type Chronic pain managed with tramadol . No recent falls or swelling. - Continue tramadol  as needed for pain. - Updated controlled substance prescribing agreement for tramadol . - Encouraged weight loss to reduce mechanical strain on knee   Patient was given the opportunity to ask questions.  Patient verbalized understanding of the plan and was able to repeat key elements of the plan.   This documentation was completed using Paediatric nurse.  Any transcriptional errors are unintentional.  Orders Placed This Encounter  Procedures   Lipid panel   Comprehensive metabolic panel with GFR     Requested Prescriptions    No prescriptions requested or ordered in this encounter    Return in about 4  months (around 10/04/2024).  Barnie Louder, MD, FACP

## 2024-06-05 NOTE — Patient Instructions (Addendum)
  VISIT SUMMARY: You came in today for a follow-up visit to manage your hypertension, hyperlipidemia, and left knee arthritis. Your blood pressure is well-controlled, and you have successfully resolved the issue with your swollen leg. You have also lost 4 pounds since your last visit and are maintaining a healthy lifestyle with regular exercise and a balanced diet.  YOUR PLAN: -ESSENTIAL HYPERTENSION: Essential hypertension means high blood pressure without a known secondary cause. Your blood pressure is well-controlled with your current medications. Continue taking amlodipine  10 mg daily, metoprolol  XL 100 mg daily, and hydrochlorothiazide  25 mg daily. Keep up with your low-salt diet.  -HYPERLIPIDEMIA: Hyperlipidemia means having high levels of fats (lipids) in your blood, which can increase the risk of heart disease. We have ordered a cholesterol panel to re-evaluate your cholesterol levels. Continue taking atorvastatin  20 mg daily at night.  -LEFT KNEE OSTEOARTHRITIS: Osteoarthritis is a condition where the protective cartilage in your joints wears down over time, causing pain and stiffness. Continue taking tramadol  as needed for pain relief. We have updated your controlled substance prescribing agreement for tramadol . Continue with weight loss efforts to reduce strain on your knee.  -OBESITY: Obesity means having an excessive amount of body fat, which can lead to various health issues. You have lost 4 pounds since your last visit. Keep up with your regular exercise and healthy eating habits.  -GENERAL HEALTH MAINTENANCE: We discussed routine health maintenance. Continue with your current healthy lifestyle choices. We will see you again in 4 months for a follow-up appointment.  INSTRUCTIONS: Please follow up in 4 months for your next appointment. Continue monitoring your blood pressure weekly and maintain your current medications and lifestyle habits. We will review your cholesterol levels once the  results from the cholesterol panel are available.                      Contains text generated by [employer].                                 Contains text generated by [employer].

## 2024-06-06 ENCOUNTER — Ambulatory Visit: Payer: Self-pay | Admitting: Internal Medicine

## 2024-06-06 LAB — COMPREHENSIVE METABOLIC PANEL WITH GFR
ALT: 30 IU/L (ref 0–44)
AST: 22 IU/L (ref 0–40)
Albumin: 4.6 g/dL (ref 3.8–4.9)
Alkaline Phosphatase: 61 IU/L (ref 47–123)
BUN/Creatinine Ratio: 20 (ref 10–24)
BUN: 14 mg/dL (ref 8–27)
Bilirubin Total: 0.4 mg/dL (ref 0.0–1.2)
CO2: 24 mmol/L (ref 20–29)
Calcium: 9.3 mg/dL (ref 8.6–10.2)
Chloride: 98 mmol/L (ref 96–106)
Creatinine, Ser: 0.7 mg/dL — ABNORMAL LOW (ref 0.76–1.27)
Globulin, Total: 2.8 g/dL (ref 1.5–4.5)
Glucose: 83 mg/dL (ref 70–99)
Potassium: 4 mmol/L (ref 3.5–5.2)
Sodium: 139 mmol/L (ref 134–144)
Total Protein: 7.4 g/dL (ref 6.0–8.5)
eGFR: 105 mL/min/1.73 (ref >59)

## 2024-06-06 LAB — LIPID PANEL
Chol/HDL Ratio: 3.7 ratio (ref 0.0–5.0)
Cholesterol, Total: 149 mg/dL (ref 100–199)
HDL: 40 mg/dL (ref >39)
LDL Chol Calc (NIH): 79 mg/dL (ref 0–99)
Triglycerides: 176 mg/dL — ABNORMAL HIGH (ref 0–149)
VLDL Cholesterol Cal: 30 mg/dL (ref 5–40)

## 2024-10-05 ENCOUNTER — Ambulatory Visit: Admitting: Internal Medicine
